# Patient Record
Sex: Male | Born: 1950
Health system: Southern US, Community
[De-identification: ages and names within clinical notes are randomized; demographics above are authoritative.]

## PROBLEM LIST (undated history)

## (undated) DIAGNOSIS — E785 Hyperlipidemia, unspecified: Secondary | ICD-10-CM

## (undated) DIAGNOSIS — I499 Cardiac arrhythmia, unspecified: Secondary | ICD-10-CM

## (undated) DIAGNOSIS — N529 Male erectile dysfunction, unspecified: Secondary | ICD-10-CM

## (undated) HISTORY — PX: COLONOSCOPY: SHX174

## (undated) HISTORY — DX: Male erectile dysfunction, unspecified: N52.9

## (undated) HISTORY — DX: Hyperlipidemia, unspecified: E78.5

## (undated) HISTORY — PX: EXCISION MASS ABDOMINAL: SHX6701

---

## 2000-04-10 LAB — HM MAMMOGRAPHY

## 2002-05-24 LAB — HM COLONOSCOPY

## 2006-06-12 ENCOUNTER — Ambulatory Visit: Payer: Self-pay | Admitting: Family Medicine

## 2006-07-18 ENCOUNTER — Ambulatory Visit: Payer: Self-pay | Admitting: Family Medicine

## 2006-12-09 ENCOUNTER — Ambulatory Visit: Payer: Self-pay | Admitting: Family Medicine

## 2007-12-07 ENCOUNTER — Ambulatory Visit: Payer: Self-pay | Admitting: Family Medicine

## 2008-07-11 ENCOUNTER — Ambulatory Visit: Payer: Self-pay | Admitting: Sports Medicine

## 2008-07-11 DIAGNOSIS — M79609 Pain in unspecified limb: Secondary | ICD-10-CM | POA: Insufficient documentation

## 2008-07-11 DIAGNOSIS — M25569 Pain in unspecified knee: Secondary | ICD-10-CM | POA: Insufficient documentation

## 2008-07-11 DIAGNOSIS — M2020 Hallux rigidus, unspecified foot: Secondary | ICD-10-CM | POA: Insufficient documentation

## 2008-08-12 ENCOUNTER — Ambulatory Visit: Payer: Self-pay | Admitting: Family Medicine

## 2008-09-25 ENCOUNTER — Emergency Department (HOSPITAL_COMMUNITY): Admission: EM | Admit: 2008-09-25 | Discharge: 2008-09-25 | Payer: Self-pay | Admitting: Emergency Medicine

## 2008-11-23 ENCOUNTER — Ambulatory Visit: Payer: Self-pay | Admitting: Family Medicine

## 2009-05-23 ENCOUNTER — Ambulatory Visit: Payer: Self-pay | Admitting: Family Medicine

## 2009-08-21 ENCOUNTER — Ambulatory Visit: Payer: Self-pay | Admitting: Sports Medicine

## 2009-08-21 DIAGNOSIS — S838X9A Sprain of other specified parts of unspecified knee, initial encounter: Secondary | ICD-10-CM | POA: Insufficient documentation

## 2009-08-21 DIAGNOSIS — S86819A Strain of other muscle(s) and tendon(s) at lower leg level, unspecified leg, initial encounter: Secondary | ICD-10-CM

## 2009-08-22 ENCOUNTER — Ambulatory Visit: Payer: Self-pay | Admitting: Family Medicine

## 2009-08-29 ENCOUNTER — Telehealth (INDEPENDENT_AMBULATORY_CARE_PROVIDER_SITE_OTHER): Payer: Self-pay | Admitting: *Deleted

## 2009-09-12 ENCOUNTER — Ambulatory Visit: Payer: Self-pay | Admitting: Family Medicine

## 2009-10-02 ENCOUNTER — Ambulatory Visit: Payer: Self-pay | Admitting: Family Medicine

## 2009-10-05 ENCOUNTER — Ambulatory Visit: Payer: Self-pay | Admitting: Family Medicine

## 2009-10-20 ENCOUNTER — Ambulatory Visit: Payer: Self-pay | Admitting: Family Medicine

## 2010-01-08 ENCOUNTER — Ambulatory Visit: Payer: Self-pay | Admitting: Family Medicine

## 2010-03-15 ENCOUNTER — Ambulatory Visit: Payer: Self-pay | Admitting: Family Medicine

## 2010-08-08 ENCOUNTER — Ambulatory Visit: Payer: Self-pay | Admitting: Family Medicine

## 2010-11-06 NOTE — Progress Notes (Signed)
Summary: QUESTIONS RE: INJURY  Phone Note Call from Patient   Summary of Call: Patient called and has a question regarding his injury.  He was seen here last week. Please call him @ [phone]. Initial call taken by: Shawna Orleans  Follow-up for Phone Call        dr fields spoke to pt via phone and gave him instructions Follow-up by: Lillia Pauls CMA,  August 29, 2009 11:56 AM

## 2010-11-06 NOTE — Assessment & Plan Note (Signed)
Summary: np/knee pain wp   Vital Signs:  Patient Profile:   60 Years Old Male Weight:      138.5 pounds Pulse rate:   51 / minute BP sitting:   128 / 76  Vitals Entered By: Lillia Pauls CMA (July 11, 2008 10:29 AM)                 Chief Complaint:  NP WITH ANTERIOR R KNEE PAIN X SEVERAL MONTHS; B CALF PAIN X 1 WK.  History of Present Illness: 60 yo WM new patient here for:  1.  anterior right knee pain x "months."  No swelling, clicking, popping, or feeling like it is going to give out.  Does not interfere with running.  Runs  ~ 60 miles/week for past 30 years.  No prior knee injury or trauma.  Taking diclofenac as needed, but unsure if it is helping with pain.  Pt thinks he had this pain 1 year ago, but it got better so he canceled his appointment with his doctor.  2. b/l calf pain- took cipro last week for sinusitis.  Last week while running notices cramping in b/l mid-calf region.  Worried about tendonitis, so stopped taking cipro and stopped running x 1 week.  Pain is almost gone now.      Current Allergies: ! CIPRO  Past Medical History:    HLD- on lipitor  Past Surgical History:    none   Social History:    runs 60 miles per week for past 30 years.  Does little other crosstraining.     Physical Exam  General:     NAD, pleasant, alert Msk:     Right Knee: Normal to inspection with no erythema or effusion or obvious bony abnormalities. Palpation normal with no warmth or joint line tenderness, patellar tenderness, or condyle tenderness. ROM normal in flexion and extension and lower leg rotation. Ligaments with solid consistent endpoints including ACL, PCL, LCL, MCL. Negative Mcmurray's. + tenderness with tilt of distal portion of patella.  Relative weakness of b/l VMO muscle group compared to lateral quad muscle groups.  Feet: Bilateral hallux rigiditis  Hips: B/L Hip abduction weakness  Gait:  barefoot running and walking gait  unremarkable    Impression & Recommendations:  Problem # 1:  KNEE PAIN, RIGHT (ICD-719.46) Assessment: New Likely due to relative weakness of VMO & gluteus medius muscle groups in long-distance runner with no other cross-training activity.  This is causing some instability of patella.   - cho pat fitted to right knee for patellar stabilization; pt to wear when running - pt can run as long as no swelling in knee and not limping - encouraged crosstraining on stationary bike to increasee strength of medial quad muscles - quad sets, 2 way straight leg raises, drop squats, bent leg lifts, and staning hip rotation - f/u in 6 weeks Orders: Knee Strap- [facility] (O2703)   Problem # 2:  CALF PAIN, BILATERAL (ICD-729.5) In relation to taking cipro for sinusitis.  Pt has self-discontinued Cipro and calf pain now better.  Complaints not c/w achillnes tendinopathy.  Advised pt to avoid cipro in future as he may be intolerant of it.  Also calf pain may have been due to viral infection causing sinusitis.    ]

## 2010-11-06 NOTE — Assessment & Plan Note (Signed)
Summary: HAMSTRING INJURY   Vital Signs:  Patient profile:   60 year old male Height:      68 inches Weight:      136 pounds BMI:     20.75 BP sitting:   127 / 80  Vitals Entered By: Lillia Pauls CMA (August 21, 2009 9:49 AM)  History of Present Illness: 1. L hamstring 3 weeks ago began having pain in lateral knee tendon. This progressed to the superior calf muscle and since migrated to the hamstring region. Has interfered with running and has been forcing him to stop runs short. Burgess Estelle made it 4 miles and noted "locking and cramping in the hamstring muscled" and forced to walk home. No pain at rest or with waling. Hasn't noted any swelling or mass in posterior thigh.   Has recently been doing 1/2 mile downhill repeats.   On lipitor for past 5 years.   Current Medications (verified): 1)  Lipitor 10 Mg Tabs (Atorvastatin Calcium) .... One By Mouth Daily For Cholesterol  Allergies (verified): 1)  ! Cipro  Physical Exam  General:  NAD, pleasant, alert Msk:  L leg + fullness and swelling in lateral portion of posterior knee joint + mild TTP over lateral hamstring tendon at insertion of fibular head -decreased L hamstring strength  Additional Exam:  Ultrasound:  no Baker's cyst + mild separation/fluid of hamstring tendon from fibular head One are of hypechoic changes in distal MM suggestive of fluid collection + mild separtion/fluid of gastroc tendon from fibular head increased vascularity of distal hamstring  images saved   Impression & Recommendations:  Problem # 1:  MUSCLE STRAIN, HAMSTRING MUSCLE (ICD-844.8)  initial exam concerning for baker's cyst but ultrasound has ruled this out.   recent 1/2 mile downhill work has likely caused overstriding creating tendonitis in both the hamstring and gastrocnemius tendons Will prescribe hamstring compression sleeve Will also provide heel lift to relieve pressure on hamstring.   need to check in 4 to 6 weeks if not  making good progress  Orders: US EXTREMITY NON-VASC REAL-TIME IMG (88416)  Complete Medication List: 1)  Lipitor 10 Mg Tabs (Atorvastatin calcium) .... One by mouth daily for cholesterol  Patient Instructions: 1)  3 sets of 15: hamstring curls, swings, and lunges with an ankle weight (both sides). 2)  Use the compression sleeve to help protect the hamstring. 3)  You can run as fast and as long as you keep from limping.  4)  Consider cross training on a bike. 5)  Do the heel raises on a step to work on your calf.

## 2010-12-20 ENCOUNTER — Encounter: Payer: Self-pay | Admitting: Sports Medicine

## 2010-12-20 ENCOUNTER — Ambulatory Visit (INDEPENDENT_AMBULATORY_CARE_PROVIDER_SITE_OTHER): Payer: BC Managed Care – PPO | Admitting: Sports Medicine

## 2010-12-20 DIAGNOSIS — M79609 Pain in unspecified limb: Secondary | ICD-10-CM

## 2010-12-25 NOTE — Assessment & Plan Note (Signed)
Summary: LEFT CALF INJURY DUE TO RUNNING,MC   Vital Signs:  Patient profile:   60 year old male Pulse rate:   64 / minute BP sitting:   114 / 76  (left arm)  Vitals Entered By: Jeannie Done CC: lt calf pain   CC:  lt calf pain.  History of Present Illness: Pt presents to clinic for eval of L calf pain that started approx 1 week ago during a run.   Pt runs 50 miles per week.  Pt has been traveling for work- was in [place] for 1 month, calf pain started about 5 days after he got home.    pain has been localized to the lateral aspect of the left calf. This has been severe enough to cause him to limp and he has been unable to run for the past few days.   Allergies: 1)  ! Cipro  Physical Exam  General:  Well-developed,well-nourished,in no acute distress; alert,appropriate and cooperative throughout examination Msk:  lt calf is 36.5 at 8 cm below tib tubercle rt calf is 35 cm at 8 cm below tib tubercle tender at lat head of gastroc, and proximal insertion of AT distal AT nontender localized swelling at gastroc heads lt calf leg lengths equal strength and muscle definition good   Additional Exam:  MSK [place] focal tear in lat gastroc this is assoc with neovessels edema of gastrioc on long and trans scan   Impression & Recommendations:  Problem # 1:  CALF PAIN, LEFT (ICD-729.5)  AT rehab program  compression sleeve  Trial of NTG  rescan in 4 weeks  Orders: Garment,belt,sleeve or other covering ,elastic or similar stretch (Z6109) Sports Insoles (L3510) [place] LIMITED (60454)  Complete Medication List: 1)  Lipitor 10 Mg Tabs (Atorvastatin calcium) .... One by mouth daily for cholesterol 2)  Nitroglycerin 0.2 Mg/hr Pt24 (Nitroglycerin) .... Apply 1/4 patch to affected area daily as directed.  change patch every 24 hours. Prescriptions: NITROGLYCERIN 0.2 MG/HR PT24 (NITROGLYCERIN) Apply 1/4 patch to affected area daily as directed.  Change patch every 24 hours.  #30 x  1   Entered and Authorized by:   Enid Baas MD   Signed by:   Enid Baas MD on 12/20/2010   Method used:   Electronically to        [facility] # [id number]* (retail)       [address]       [place], [place]  [address]       Ph: [phone]       Fax: [phone]   RxID:   [id number]    Orders Added: 1)  Garment,belt,sleeve or other covering ,elastic or similar stretch [A4466] 2)  Sports Insoles [L3510] 3)  [place] LIMITED [66440] 4)  Est. Patient Level III [34742]

## 2011-01-21 ENCOUNTER — Encounter: Payer: Self-pay | Admitting: Sports Medicine

## 2011-01-21 ENCOUNTER — Ambulatory Visit (INDEPENDENT_AMBULATORY_CARE_PROVIDER_SITE_OTHER): Payer: BC Managed Care – PPO | Admitting: Sports Medicine

## 2011-01-21 VITALS — BP 108/76

## 2011-01-21 DIAGNOSIS — M79609 Pain in unspecified limb: Secondary | ICD-10-CM

## 2011-01-21 NOTE — Progress Notes (Signed)
  Subjective:    Patient ID: Edwin Jimenez, male    DOB: [birth year], 60 y.o.   MRN: [id number]  HPI Patient returns in followup of left calf injury. He has been using nitroglycerin patches. He has been doing the exercises on a daily basis. He is using sports insoles with a heel lift and these also help with his running. He has steadily increased his running to her last week he did 40 miles. He states that he does not have any significant pain with running and only some aching afterwards. He states he is 90% better. At first he was using the compression sleeve all the time and now is using it just runs.   Review of Systems No side effects with the nitroglycerin.    Objective:   Physical Exam    patient is in no acute distress. Calf is nontender and appears to be the same diameter as the right calf. Palpation reveals one area along the lateral gastroc that is still slightly sensitive to deep pressure.  Musculoskeletal ultrasound The area prior to hearing has about 90% resolved. The area of neovessels is now absent and there is some increased flow along the muscle belly but this looks to be a normal pattern. There is only a small hypoechoic area remaining.   Assessment & Plan:

## 2011-01-21 NOTE — Assessment & Plan Note (Signed)
This appears to be 90% healed. However with the tendency of the calf tears to recur I would like him to keep using compression and heel lifts. He should continue his exercises a minimum of 3 times per week.  Keep his running knowledge and no more than 40 for the next 2 weeks and then at 5 miles per week. Continue his nitroglycerin for another 6 weeks. If he feels that he is completely healed at that time he can resume his normal activities and wean off the nitroglycerin. If not he should return and we will repeat the scan.

## 2011-04-19 ENCOUNTER — Telehealth: Payer: Self-pay | Admitting: Family Medicine

## 2011-04-19 NOTE — Telephone Encounter (Signed)
FAXED RX TODAY

## 2011-04-19 NOTE — Telephone Encounter (Signed)
He can have the Ambien

## 2011-04-19 NOTE — Telephone Encounter (Signed)
IS THIS OK 

## 2011-08-05 ENCOUNTER — Other Ambulatory Visit: Payer: Self-pay | Admitting: Family Medicine

## 2011-08-06 NOTE — Telephone Encounter (Signed)
Needs an OV to get anymore refills

## 2011-09-03 ENCOUNTER — Encounter: Payer: Self-pay | Admitting: Family Medicine

## 2011-09-04 ENCOUNTER — Ambulatory Visit (INDEPENDENT_AMBULATORY_CARE_PROVIDER_SITE_OTHER): Payer: BC Managed Care – PPO | Admitting: Family Medicine

## 2011-09-04 ENCOUNTER — Encounter: Payer: Self-pay | Admitting: Family Medicine

## 2011-09-04 DIAGNOSIS — E785 Hyperlipidemia, unspecified: Secondary | ICD-10-CM | POA: Insufficient documentation

## 2011-09-04 DIAGNOSIS — Z8249 Family history of ischemic heart disease and other diseases of the circulatory system: Secondary | ICD-10-CM | POA: Insufficient documentation

## 2011-09-04 DIAGNOSIS — Z0000 Encounter for general adult medical examination without abnormal findings: Secondary | ICD-10-CM

## 2011-09-04 DIAGNOSIS — N529 Male erectile dysfunction, unspecified: Secondary | ICD-10-CM | POA: Insufficient documentation

## 2011-09-04 LAB — CBC WITH DIFFERENTIAL/PLATELET
Basophils Absolute: 0 10*3/uL (ref 0.0–0.1)
Basophils Relative: 1 % (ref 0–1)
Eosinophils Absolute: 0.1 10*3/uL (ref 0.0–0.7)
Eosinophils Relative: 2 % (ref 0–5)
HCT: 48.2 % (ref 39.0–52.0)
Hemoglobin: 16.2 g/dL (ref 13.0–17.0)
Lymphocytes Relative: 38 % (ref 12–46)
Lymphs Abs: 1.8 10*3/uL (ref 0.7–4.0)
MCH: 32.5 pg (ref 26.0–34.0)
MCHC: 33.6 g/dL (ref 30.0–36.0)
MCV: 96.8 fL (ref 78.0–100.0)
Monocytes Absolute: 0.5 10*3/uL (ref 0.1–1.0)
Monocytes Relative: 10 % (ref 3–12)
Neutro Abs: 2.3 10*3/uL (ref 1.7–7.7)
Neutrophils Relative %: 50 % (ref 43–77)
Platelets: 206 10*3/uL (ref 150–400)
RBC: 4.98 MIL/uL (ref 4.22–5.81)
RDW: 13.3 % (ref 11.5–15.5)
WBC: 4.7 10*3/uL (ref 4.0–10.5)

## 2011-09-04 LAB — COMPREHENSIVE METABOLIC PANEL
ALT: 44 U/L (ref 0–53)
AST: 37 U/L (ref 0–37)
Albumin: 4.4 g/dL (ref 3.5–5.2)
Alkaline Phosphatase: 60 U/L (ref 39–117)
BUN: 21 mg/dL (ref 6–23)
CO2: 29 mEq/L (ref 19–32)
Calcium: 9.6 mg/dL (ref 8.4–10.5)
Chloride: 104 mEq/L (ref 96–112)
Creat: 0.87 mg/dL (ref 0.50–1.35)
Glucose, Bld: 84 mg/dL (ref 70–99)
Potassium: 4.3 mEq/L (ref 3.5–5.3)
Sodium: 142 mEq/L (ref 135–145)
Total Bilirubin: 0.7 mg/dL (ref 0.3–1.2)
Total Protein: 6.9 g/dL (ref 6.0–8.3)

## 2011-09-04 LAB — LIPID PANEL
Cholesterol: 158 mg/dL (ref 0–200)
HDL: 56 mg/dL (ref >39)
LDL Cholesterol: 91 mg/dL (ref 0–99)
Total CHOL/HDL Ratio: 2.8 Ratio
Triglycerides: 55 mg/dL (ref <150)
VLDL: 11 mg/dL (ref 0–40)

## 2011-09-04 LAB — PSA: PSA: 0.37 ng/mL (ref <=4.00)

## 2011-09-04 MED ORDER — ATORVASTATIN CALCIUM 20 MG PO TABS: 20.0000 mg | ORAL_TABLET | Freq: Every day | ORAL | Status: DC

## 2011-09-04 MED ORDER — TADALAFIL 10 MG PO TABS: 5.0000 mg | ORAL_TABLET | ORAL | Status: DC | PRN

## 2011-09-04 NOTE — Patient Instructions (Signed)
Continue to take excellent care of yourself 

## 2011-09-04 NOTE — Progress Notes (Signed)
  Subjective:    Patient ID: Edwin Jimenez, male    DOB: 1951-02-07, 60 y.o.   MRN: [id number]  HPI He is here for a complete examination. He continues on his Lipitor. He also does have intermittent difficulty with erectile dysfunction. He did find that Cialis didn't work the best. He continues on aspirin and fish oil. His family and social history is unchanged. Work is going well. He does run regularly.   Review of Systems  Constitutional: Negative.   HENT: Negative.   Eyes: Negative.   Respiratory: Negative.   Cardiovascular: Negative.   Gastrointestinal: Negative.   Genitourinary: Negative.   Musculoskeletal: Negative.   Skin: Negative.   Neurological: Negative.   Hematological: Negative.   Psychiatric/Behavioral: Negative.        Objective:   Physical Exam BP 110/70  Pulse 54  Ht 5\' 8"  (1.727 m)  Wt 135 lb (61.236 kg)  BMI 20.53 kg/m2  General Appearance:    Alert, cooperative, no distress, appears stated age  Head:    Normocephalic, without obvious abnormality, atraumatic  Eyes:    PERRL, conjunctiva/corneas clear, EOM's intact, fundi    benign  Ears:    Normal TM's and external ear canals  after cerumen was removed   Nose:   Nares normal, mucosa normal, no drainage or sinus   tenderness  Throat:   Lips, mucosa, and tongue normal; teeth and gums normal  Neck:   Supple, no lymphadenopathy;  thyroid:  no   enlargement/tenderness/nodules; no carotid   bruit or JVD  Back:    Spine nontender, no curvature, ROM normal, no CVA     tenderness  Lungs:     Clear to auscultation bilaterally without wheezes, rales or     ronchi; respirations unlabored  Chest Wall:    No tenderness or deformity   Heart:    Regular rate and rhythm, S1 and S2 normal, no murmur, rub   or gallop  Breast Exam:    No chest wall tenderness, masses or gynecomastia  Abdomen:     Soft, non-tender, nondistended, normoactive bowel sounds,    no masses, no hepatosplenomegaly  Genitalia:   deferred     Rectal:   deferred   Extremities:   No clubbing, cyanosis or edema  Pulses:   2+ and symmetric all extremities  Skin:   Skin color, texture, turgor normal, no rashes or lesions  Lymph nodes:   Cervical, supraclavicular, and axillary nodes normal  Neurologic:   CNII-XII intact, normal strength, sensation and gait; reflexes 2+ and symmetric throughout          Psych:   Normal mood, affect, hygiene and grooming.           Assessment & Plan:   1. Routine general medical examination at a health care facility  CBC with Differential, Comprehensive metabolic panel, Lipid panel, PSA, HM COLONOSCOPY  2. Family history of ischemic heart disease (IHD)  CBC with Differential, Comprehensive metabolic panel, Lipid panel  3. Hyperlipidemia LDL goal < 70  Lipid panel  4. ED (erectile dysfunction)     5 cerumen impaction  prescription for Cialis was written. Encouraged him to continue with his active lifestyle. Lipitor was renewed. Recommend he return here when he turns 64 a Zostavax.

## 2011-09-20 ENCOUNTER — Ambulatory Visit: Payer: BC Managed Care – PPO | Admitting: Family Medicine

## 2011-11-05 ENCOUNTER — Encounter: Payer: Self-pay | Admitting: Internal Medicine

## 2011-11-08 ENCOUNTER — Other Ambulatory Visit: Payer: BC Managed Care – PPO

## 2011-11-08 DIAGNOSIS — E785 Hyperlipidemia, unspecified: Secondary | ICD-10-CM

## 2011-11-09 LAB — LIPID PANEL
Cholesterol: 111 mg/dL (ref 0–200)
HDL: 46 mg/dL (ref >39)
LDL Cholesterol: 57 mg/dL (ref 0–99)
Total CHOL/HDL Ratio: 2.4 Ratio
Triglycerides: 38 mg/dL (ref <150)
VLDL: 8 mg/dL (ref 0–40)

## 2011-11-11 NOTE — Progress Notes (Signed)
Quick Note:  The blood work is normal ______ 

## 2011-11-28 LAB — HM COLONOSCOPY

## 2012-01-16 ENCOUNTER — Encounter: Payer: Self-pay | Admitting: Gastroenterology

## 2012-03-19 ENCOUNTER — Ambulatory Visit (INDEPENDENT_AMBULATORY_CARE_PROVIDER_SITE_OTHER): Payer: BC Managed Care – PPO | Admitting: Sports Medicine

## 2012-03-19 VITALS — BP 110/70 | Ht 68.0 in | Wt 138.0 lb

## 2012-03-19 DIAGNOSIS — S86819A Strain of other muscle(s) and tendon(s) at lower leg level, unspecified leg, initial encounter: Secondary | ICD-10-CM

## 2012-03-19 DIAGNOSIS — S838X9A Sprain of other specified parts of unspecified knee, initial encounter: Secondary | ICD-10-CM

## 2012-03-19 NOTE — Progress Notes (Signed)
Subjective:    Patient ID: Edwin Jimenez is a 61 y.o. male. DOB [birth year]  MRN [id number]   Chief Complaint: Hamstring pain L>R HPI Edwin Jimenez is a 61 yo man who presents for evaluation of hamstring pain and tightness.  He reports that problem started while running the [employer].  Pt reports that at mile 17-18 he began to feel "twinges" in bilateral hamstrings L>R. At mile 19-20 he had to stop running because of severe cramping.  Walked until cramping abated enough to allow him to finish the race running.  He reports that since this marathon, his hamstrings have not been the same.  He had to take 2 weeks off completely from all training and now even a 5-6 mild run causes him to have cramping and tightness afterwards that causes him to limp. Pain to day is predominantly in the proximal medial thigh and posterior upper leg.  He denies any weakness, numbness, or tingling.    Aside from this pain, patient also reports that he is a profuse sweater and is taking a statin medication.  He is concerned that these two things may be influencing the pain.  On statin medication since 2006. Dose last increased in 2007.  No previous muscle cramps attributed to this medication.   Current Outpatient Prescriptions on File Prior to Visit  Medication Sig Dispense Refill  . aspirin 81 MG tablet Take 81 mg by mouth daily.        Marland Kitchen atorvastatin (LIPITOR) 20 MG tablet Take 1 tablet (20 mg total) by mouth daily.  90 tablet  3  . calcium carbonate (OS-CAL) 1250 MG chewable tablet Chew 1 tablet by mouth daily.        . fish oil-omega-3 fatty acids 1000 MG capsule Take 2 g by mouth daily.        . tadalafil (CIALIS) 10 MG tablet Take 0.5 tablets (5 mg total) by mouth as needed for erectile dysfunction.  30 tablet  0   No Known Allergies  Social History   Occupational History  . Not on file.   Social History Main Topics  . Smoking status: Never Smoker   . Smokeless tobacco: Not on file  . Alcohol Use: Not on file  .  Drug Use: Not on file  . Sexually Active: Not on file    ROS  12 system review of systems is negative except per HPI     Objective:   Ortho Exam  GEN: Alert, cooperative male in no apparent distress RESP: Normal work of breathing MSK: Significant weakness of bilateral hip flexors, abductors, and adductors.  Normal strength in bilateral quadriceps and hamstring. No point tenderness to palpation. Full ROM of hip, knee, and ankle without pain. NEURO: No deficits in sensation, strength defecits as above  Normal running gait     Assessment:     Bilateral hip abductor, adductor, and flexor weakness       Plan:     Dietary supplements:  1. Recommend Coenzyme Q10 supplement daily for prevention of cramping 2/2 statin medication 2. Take 2-3 tablespoons of dill pickle juice after a run to replace acetic acid 3. Drink at least 16 oz gatorade daily, especially during summer months to avoid dehydration and replace electrolytes 4. Recommend high protein foods such as tuna or consider supplementing with creatine powder 5 grams daily  Exercises - do each exercise on both legs: 1. Side-lying leg lifts (inside and outside) 3 sets of 15 2. Hip flexion (knee lifts) 3  sets of 15 3. Lateral step ups (from side) 3 sets of 15 4. Forward step ups 3 sets of 15 5. Cross over step ups 3 sets of 15  Avoid dehydration.  Weigh yourself daily to monitor for unintentional weight loss and monitor urine color.  If urine is darker than lemonade, increase fluid intake.   Limit training until symptoms and strength improves.  If not improved in 1 month, return to clinic.

## 2012-03-19 NOTE — Patient Instructions (Addendum)
Dietary supplements:  1. Recommend Coenzyme Q10 supplement daily 2. Take 2-3 tablespoons of dill pickle juice after a run to replace acetic acid 3. Drink at least 16 oz gatorade daily, especially during summer months 4. Recommend high protein foods such as tuna or consider supplementing with creatine powder 5 grams daily  Exercises - do each exercise on both legs: 1. Side-lying leg lifts (inside and outside) 3 sets of 15 2. Hip flexion (knee lifts) 3 sets of 15 3. Lateral step ups (from side) 3 sets of 15 4. Forward step ups 3 sets of 15 5. Cross over step ups 3 sets of 15  Avoid dehydration.  Weigh yourself daily to monitor for unintentional weight loss and monitor urine color.  If urine is darker than lemonade, increase fluid intake.   Limit training until symptoms and strength improves.  If not improved in 1 month, return to clinic.

## 2012-03-19 NOTE — Assessment & Plan Note (Signed)
Please see the assessment and plan as this patient has hamstring pain but actually notices weakness are in the of the muscle groups around the leg forcing him to overuse his hamstrings  We will proceed with nutritional changes supplements and exercises as described

## 2012-04-07 ENCOUNTER — Ambulatory Visit (INDEPENDENT_AMBULATORY_CARE_PROVIDER_SITE_OTHER): Payer: BC Managed Care – PPO | Admitting: Sports Medicine

## 2012-04-07 VITALS — BP 123/75

## 2012-04-07 DIAGNOSIS — S86819A Strain of other muscle(s) and tendon(s) at lower leg level, unspecified leg, initial encounter: Secondary | ICD-10-CM

## 2012-04-07 DIAGNOSIS — S838X9A Sprain of other specified parts of unspecified knee, initial encounter: Secondary | ICD-10-CM

## 2012-04-07 DIAGNOSIS — IMO0002 Reserved for concepts with insufficient information to code with codable children: Secondary | ICD-10-CM

## 2012-04-07 DIAGNOSIS — S76019A Strain of muscle, fascia and tendon of unspecified hip, initial encounter: Secondary | ICD-10-CM | POA: Insufficient documentation

## 2012-04-07 NOTE — Assessment & Plan Note (Addendum)
Suspect this is secondary to weak abductors and adductors.  Advised to continue HEP.   Cont to use thigh compression sleeves for running and keep mileage at level that does not increase delayed pain

## 2012-04-07 NOTE — Assessment & Plan Note (Addendum)
Secondary to muscle weakness.  Advised to continue HEP, add weight with step-up exercise, and not to run distances that cause severe pain.   Also has week abduction  With weak core he needs to improve strength before he can up his mileage much  Of note, I think he may be taking additional time as Annia Belt seemed to have caused a lot of MM damage  Reck 6 wks

## 2012-04-07 NOTE — Patient Instructions (Signed)
Please continue your hip strengthening exercises.  For the step- up exercise, you can start adding weight with a barbell or two hand weights. For your running, do not push yourself to the point of severe pain.  Please follow up with [place] in 4-6 weeks.

## 2012-04-07 NOTE — Progress Notes (Signed)
  Subjective:    Patient ID: Edwin Jimenez, male    DOB: 1951/06/10, 61 y.o.   MRN: [id number]  HPI  Edwin Jimenez returns for his left hamstring pain.  He says he has been doing the home exercise program and taking the supplements suggested by Dr. Darrick Penna without much improvement.  He says the pain has moved some- he still has pain in his left hamstring but now has some significant pain in his left groin as well.    On Saturday he ran about 11 miles, and says he "paid for it" on Sunday.  He ran 6 miles this morning and feels OK.  He is still running about 5 days a week.    Review of Systems     Objective:   Physical Exam BP 123/75 General appearance: alert, cooperative and no distress Hip: ROM HipRotational ROM is norm bilat   Flexion: 120 Deg, Extension: 100 Deg, Abduction: 45 Deg, Adduction: 45 Deg Strength IR: 5/5, ER: 5/5, Flexion: 5/5, Extension: 5/5, Abduction: 4/5, Adduction: 4/5 Pelvic alignment unremarkable to inspection and palpation. Standing hip rotation and gait without trendelenburg / unsteadiness. Greater trochanter without tenderness to palpation.       Assessment & Plan:

## 2012-04-30 ENCOUNTER — Ambulatory Visit
Admission: RE | Admit: 2012-04-30 | Discharge: 2012-04-30 | Disposition: A | Discharge status: Home / Self Care | Payer: BC Managed Care – PPO | Source: Ambulatory Visit | Attending: Sports Medicine | Admitting: Sports Medicine

## 2012-04-30 ENCOUNTER — Ambulatory Visit (INDEPENDENT_AMBULATORY_CARE_PROVIDER_SITE_OTHER): Payer: BC Managed Care – PPO | Admitting: Sports Medicine

## 2012-04-30 ENCOUNTER — Encounter: Payer: Self-pay | Admitting: Sports Medicine

## 2012-04-30 VITALS — BP 112/74

## 2012-04-30 DIAGNOSIS — S86819A Strain of other muscle(s) and tendon(s) at lower leg level, unspecified leg, initial encounter: Secondary | ICD-10-CM

## 2012-04-30 DIAGNOSIS — S76019A Strain of muscle, fascia and tendon of unspecified hip, initial encounter: Secondary | ICD-10-CM

## 2012-04-30 DIAGNOSIS — R1030 Lower abdominal pain, unspecified: Secondary | ICD-10-CM

## 2012-04-30 DIAGNOSIS — S838X9A Sprain of other specified parts of unspecified knee, initial encounter: Secondary | ICD-10-CM

## 2012-04-30 DIAGNOSIS — M25559 Pain in unspecified hip: Secondary | ICD-10-CM

## 2012-04-30 DIAGNOSIS — R109 Unspecified abdominal pain: Secondary | ICD-10-CM

## 2012-04-30 DIAGNOSIS — IMO0002 Reserved for concepts with insufficient information to code with codable children: Secondary | ICD-10-CM

## 2012-04-30 NOTE — Progress Notes (Signed)
  Subjective:    Patient ID: Edwin Jimenez, male    DOB: 10-Dec-1950, 61 y.o.   MRN: [id number]  HPI  Patient returns with groin pain The started with severe hamstring pain back in the [facility] in April He continued running and gradually he has had less hamstring pain although it persists He has had an increase in adductor pain He has continued to try to run but now shortening his mouth because he has too much pain Compression sleeve feels somewhat better  He has been pretty consistent with his exercise  Review of Systems     Objective:   Physical Exam   No acute distress  Tenderness at the insertion of the abductor tendon to the pubis Tenderness at the insertion of the left medial hamstring He still has some weakness as noted before but it is unclear if much of this is related to the pain  Musculoskeletal ultrasound There is a small avulsion fracture of the common abductor tendon with hypoechoic change surrounding There is an increase in Doppler activity At the hamstring insertion to the it she'll tuberosity there is also hypoechoic change suggestive of a pseudo-bursa as well as a small traction spur Doppler activity is slightly increased     Assessment & Plan:

## 2012-04-30 NOTE — Assessment & Plan Note (Signed)
Documented today by ultrasound  Keep up exercises  I think he needs to take a four-week break from running and we will recheck after that He may cross train on the bike

## 2012-04-30 NOTE — Assessment & Plan Note (Signed)
With the evulsion fracture noted we will get an AP x-ray of the pelvis  I want to be sure there are no other bony changes seen  He will need to be very gentle in the adductor and abductor exercises so as not to worsen the avulsion  Recheck in 4 weeks  Okay to use a trial of nitroglycerin

## 2012-05-12 ENCOUNTER — Other Ambulatory Visit: Payer: Self-pay | Admitting: *Deleted

## 2012-05-12 DIAGNOSIS — S76019A Strain of muscle, fascia and tendon of unspecified hip, initial encounter: Secondary | ICD-10-CM

## 2012-05-12 DIAGNOSIS — S838X9A Sprain of other specified parts of unspecified knee, initial encounter: Secondary | ICD-10-CM

## 2012-05-13 ENCOUNTER — Ambulatory Visit: Payer: BC Managed Care – PPO | Attending: Sports Medicine

## 2012-05-13 DIAGNOSIS — R269 Unspecified abnormalities of gait and mobility: Secondary | ICD-10-CM | POA: Insufficient documentation

## 2012-05-13 DIAGNOSIS — IMO0001 Reserved for inherently not codable concepts without codable children: Secondary | ICD-10-CM | POA: Insufficient documentation

## 2012-05-13 DIAGNOSIS — M25559 Pain in unspecified hip: Secondary | ICD-10-CM | POA: Insufficient documentation

## 2012-05-13 DIAGNOSIS — R5381 Other malaise: Secondary | ICD-10-CM | POA: Insufficient documentation

## 2012-05-15 ENCOUNTER — Ambulatory Visit: Payer: BC Managed Care – PPO | Admitting: Physical Therapy

## 2012-05-18 ENCOUNTER — Ambulatory Visit: Payer: BC Managed Care – PPO | Admitting: Physical Therapy

## 2012-05-20 ENCOUNTER — Ambulatory Visit: Payer: BC Managed Care – PPO | Admitting: Physical Therapy

## 2012-05-21 ENCOUNTER — Encounter: Payer: Self-pay | Admitting: Family Medicine

## 2012-05-21 ENCOUNTER — Ambulatory Visit (INDEPENDENT_AMBULATORY_CARE_PROVIDER_SITE_OTHER): Payer: BC Managed Care – PPO | Admitting: Family Medicine

## 2012-05-21 VITALS — BP 112/70 | HR 66 | Wt 139.0 lb

## 2012-05-21 DIAGNOSIS — K4090 Unilateral inguinal hernia, without obstruction or gangrene, not specified as recurrent: Secondary | ICD-10-CM

## 2012-05-21 NOTE — Progress Notes (Signed)
PT HAS APPT SEPT 5 AT 9:30 AM AT [facility]

## 2012-05-21 NOTE — Progress Notes (Signed)
  Subjective:    Patient ID: Edwin Jimenez, male    DOB: December 29, 1950, 62 y.o.   MRN: [id number]  HPI He is here for evaluation of swelling in the left inguinal area. He noted this within the last several days. He has been having difficulty with musculoskeletal issues and has seen Dr. Darrick Penna concerning this. Those records were reviewed.   Review of Systems     Objective:   Physical Exam Alert and in no distress. Exam of the inguinal area does show a 3 cm palpable lesion that is enlarged with coughing.       Assessment & Plan:   1. Left inguinal hernia  Ambulatory referral to General Surgery   since he is being held back from running due to the injuries, this would be a good time to get the hernia taking care of.

## 2012-05-29 ENCOUNTER — Encounter: Payer: BC Managed Care – PPO | Admitting: Physical Therapy

## 2012-06-01 ENCOUNTER — Ambulatory Visit (INDEPENDENT_AMBULATORY_CARE_PROVIDER_SITE_OTHER): Payer: BC Managed Care – PPO | Admitting: Sports Medicine

## 2012-06-01 VITALS — BP 102/60 | Ht 68.0 in | Wt 138.0 lb

## 2012-06-01 DIAGNOSIS — S838X9A Sprain of other specified parts of unspecified knee, initial encounter: Secondary | ICD-10-CM

## 2012-06-01 DIAGNOSIS — IMO0002 Reserved for concepts with insufficient information to code with codable children: Secondary | ICD-10-CM

## 2012-06-01 DIAGNOSIS — S76019A Strain of muscle, fascia and tendon of unspecified hip, initial encounter: Secondary | ICD-10-CM

## 2012-06-01 DIAGNOSIS — S86819A Strain of other muscle(s) and tendon(s) at lower leg level, unspecified leg, initial encounter: Secondary | ICD-10-CM

## 2012-06-01 MED ORDER — CLINDAMYCIN PHOSPHATE 1 % EX LOTN: TOPICAL_LOTION | Freq: Two times a day (BID) | CUTANEOUS | Status: AC

## 2012-06-01 NOTE — Assessment & Plan Note (Signed)
On H Test he lags 10 deg on full HS extension on left  This is about 90% resolved  Cont on PT

## 2012-06-01 NOTE — Assessment & Plan Note (Addendum)
Continue with PT  This has not healed yet so would cont to XTrain until able to run without much pain  After 6 more week of PT I will repeat scan of adductor  He will see Dr Daphine Deutscher to see if possible hernia as that could be contributing to his pain in left groin as well

## 2012-06-01 NOTE — Progress Notes (Signed)
  Subjective:    Patient ID: Edwin Jimenez, male    DOB: [birth year], 61 y.o.   MRN: [id number]  HPI  Since last visit Dr Susann Givens noted probable left inguinal hernia and suggested repair  PT at [facility] - some benefit for pain/ tried Amado Coe who is adding more taping and other modalities and this helps even more Adductor exercise still creates some pain initially HS no pain on daily basis but still TTP on exam  Uses compression sleeve for exercise  Able to bike or elliptical Not able to run much as adductor pain recurs  Last scan showed small avulsion of adductor tendon Swelling around bursal area of HS insertion HS now non painful  Review of Systems     Objective:   Physical Exam  NAD  Fullness in left inguinal area with some bulging/ no descent into scrotium  Adductor tendon still tender on left Weakness 2/2 pain on adductor testing  HS testing normal today  Running gait still with some antlagic limp      Assessment & Plan:

## 2012-06-04 ENCOUNTER — Encounter: Payer: BC Managed Care – PPO | Admitting: Physical Therapy

## 2012-06-11 ENCOUNTER — Encounter (INDEPENDENT_AMBULATORY_CARE_PROVIDER_SITE_OTHER): Payer: Self-pay | Admitting: Surgery

## 2012-06-11 ENCOUNTER — Ambulatory Visit (INDEPENDENT_AMBULATORY_CARE_PROVIDER_SITE_OTHER): Payer: BC Managed Care – PPO | Admitting: Surgery

## 2012-06-11 VITALS — BP 110/68 | HR 60 | Temp 97.4°F | Resp 14 | Ht 68.0 in | Wt 139.0 lb

## 2012-06-11 DIAGNOSIS — K4090 Unilateral inguinal hernia, without obstruction or gangrene, not specified as recurrent: Secondary | ICD-10-CM | POA: Insufficient documentation

## 2012-06-11 NOTE — Progress Notes (Signed)
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  He has a left inguinal hernia that he is aware of.  We discussed various approaches to management including doing nothing, repair now while he is recovering from a hamstring injury, or weight and repair it later. The important part of his history is he travels from [place] and often visits countries to may not be optimal for emergency surgery for an incarcerated hernia. His wife also had an incarcerated umbilical hernia couple years ago.  We discussed open and laparoscopic repair and my recommendation was for an open repair on the left side with mesh. The he is in a discuss timing with office no fluid [place] for left inguinal hernia repair.  Past Medical History  Diagnosis Date  . Dyslipidemia   . ED (erectile dysfunction)     No past surgical history on file.  Current Outpatient Prescriptions  Medication Sig Dispense Refill  . aspirin 81 MG tablet Take 81 mg by mouth daily.        Marland Kitchen atorvastatin (LIPITOR) 20 MG tablet Take 1 tablet (20 mg total) by mouth daily.  90 tablet  3  . clindamycin (CLEOCIN T) 1 % lotion Apply topically 2 (two) times daily.  60 mL  1  . fish oil-omega-3 fatty acids 1000 MG capsule Take 2 g by mouth daily.        . tadalafil (CIALIS) 10 MG tablet Take 0.5 tablets (5 mg total) by mouth as needed for erectile dysfunction.  30 tablet  0   Review of patient's allergies indicates no known allergies. Family History  Problem Relation Age of Onset  . Heart disease Father    Social History:   reports that he has never smoked. He does not have any smokeless tobacco history on file. He reports that he drinks alcohol. He reports that he does not use illicit drugs.   REVIEW OF SYSTEMS - PERTINENT POSITIVES ONLY: noncontributory  Physical Exam:   Blood pressure 110/68, pulse  60, temperature 97.4 F (36.3 C), temperature source Temporal, resp. rate 14, height 5\' 8"  (1.727 m), weight 139 lb (63.05 kg). Body mass index is 21.13 kg/(m^2).  Gen:  WDWN WM NAD  Neurological: Alert and oriented to person, place, and time. Motor and sensory function is grossly intact  Head: Normocephalic and atraumatic.  Eyes: Conjunctivae are normal. Pupils are equal, round, and reactive to light. No scleral icterus.  Neck: Normal range of motion. Neck supple. No tracheal deviation or thyromegaly present.  Cardiovascular:  SR without murmurs or gallops.  No carotid bruits Respiratory: Effort normal.  No respiratory distress. No chest wall tenderness. Breath sounds normal.  No wheezes, rales or rhonchi.  Abdomen:  nontender GU:  Lipoma on the right, left inguinal hernia that is reducible Musculoskeletal: Normal range of motion. Extremities are nontender. No cyanosis, edema or clubbing noted Lymphadenopathy: No cervical, preauricular, postauricular or axillary adenopathy is present Skin: Skin is warm and dry. No rash noted. No diaphoresis. No erythema. No pallor.  Numerous lipomata Pscyh: Normal mood and affect. Behavior is normal. Judgment and thought content normal.   LABORATORY RESULTS: No results found for this or any previous visit (from the past 48 hour(s)).  RADIOLOGY RESULTS: No results found.  Problem List: Patient Active Problem List  Diagnosis  . KNEE PAIN, RIGHT  . CALF PAIN, BILATERAL  .  HALLUX RIGIDUS  . MUSCLE STRAIN, HAMSTRING MUSCLE  . Family history of ischemic heart disease (IHD)  . Hyperlipidemia LDL goal < 70  . ED (erectile dysfunction)  . Strain of hip adductor muscle  . Inguinal hernia, left    Assessment & Plan: Left inguinal hernia in a runner currently recovering from left hamstring injury.   Open LIH repair    Matt B. Daphine Deutscher, MD, [facility] [phone] beeper [phone]  06/11/2012 10:52 AM

## 2012-06-11 NOTE — Patient Instructions (Signed)
Inguinal Hernia, Adult  Muscles help keep everything in the body in its proper place. But if a weak spot in the muscles develops, something can poke through. That is called a hernia. When this happens in the lower part of the belly (abdomen), it is called an inguinal hernia. (It takes its name from a part of the body in this region called the inguinal canal.) A weak spot in the wall of muscles lets some fat or part of the small intestine bulge through. An inguinal hernia can develop at any age. Men get them more often than women.  CAUSES   In adults, an inguinal hernia develops over time.  · It can be triggered by:  · Suddenly straining the muscles of the lower abdomen.  · Lifting heavy objects.  · Straining to have a bowel movement. Difficult bowel movements (constipation) can lead to this.  · Constant coughing. This may be caused by smoking or lung disease.  · Being overweight.  · Being pregnant.  · Working at a job that requires long periods of standing or heavy lifting.  · Having had an inguinal hernia before.  One type can be an emergency situation. It is called a strangulated inguinal hernia. It develops if part of the small intestine slips through the weak spot and cannot get back into the abdomen. The blood supply can be cut off. If that happens, part of the intestine may die. This situation requires emergency surgery.  SYMPTOMS   Often, a small inguinal hernia has no symptoms. It is found when a healthcare provider does a physical exam. Larger hernias usually have symptoms.   · In adults, symptoms may include:  · A lump in the groin. This is easier to see when the person is standing. It might disappear when lying down.  · In men, a lump in the scrotum.  · Pain or burning in the groin. This occurs especially when lifting, straining or coughing.  · A dull ache or feeling of pressure in the groin.  · Signs of a strangulated hernia can include:  · A bulge in the groin that becomes very painful and tender to the  touch.  · A bulge that turns red or purple.  · Fever, nausea and vomiting.  · Inability to have a bowel movement or to pass gas.  DIAGNOSIS   To decide if you have an inguinal hernia, a healthcare provider will probably do a physical examination.  · This will include asking questions about any symptoms you have noticed.  · The healthcare provider might feel the groin area and ask you to cough. If an inguinal hernia is felt, the healthcare provider may try to slide it back into the abdomen.  · Usually no other tests are needed.  TREATMENT   Treatments can vary. The size of the hernia makes a difference. Options include:  · Watchful waiting. This is often suggested if the hernia is small and you have had no symptoms.  · No medical procedure will be done unless symptoms develop.  · You will need to watch closely for symptoms. If any occur, contact your healthcare provider right away.  · Surgery. This is used if the hernia is larger or you have symptoms.  · Open surgery. This is usually an outpatient procedure (you will not stay overnight in a hospital). An cut (incision) is made through the skin in the groin. The hernia is put back inside the abdomen. The weak area in the muscles is   then repaired by herniorrhaphy or hernioplasty. Herniorrhaphy: in this type of surgery, the weak muscles are sewn back together. Hernioplasty: a patch or mesh is used to close the weak area in the abdominal wall.  · Laparoscopy. In this procedure, a surgeon makes small incisions. A thin tube with a tiny video camera (called a laparoscope) is put into the abdomen. The surgeon repairs the hernia with mesh by looking with the video camera and using two long instruments.  HOME CARE INSTRUCTIONS   · After surgery to repair an inguinal hernia:  · You will need to take pain medicine prescribed by your healthcare provider. Follow all directions carefully.  · You will need to take care of the wound from the incision.  · Your activity will be  restricted for awhile. This will probably include no heavy lifting for several weeks. You also should not do anything too active for a few weeks. When you can return to work will depend on the type of job that you have.  · During "watchful waiting" periods, you should:  · Maintain a healthy weight.  · Eat a diet high in fiber (fruits, vegetables and whole grains).  · Drink plenty of fluids to avoid constipation. This means drinking enough water and other liquids to keep your urine clear or pale yellow.  · Do not lift heavy objects.  · Do not stand for long periods of time.  · Quit smoking. This should keep you from developing a frequent cough.  SEEK MEDICAL CARE IF:   · A bulge develops in your groin area.  · You feel pain, a burning sensation or pressure in the groin. This might be worse if you are lifting or straining.  · You develop a fever of more than 100.5° F (38.1° C).  SEEK IMMEDIATE MEDICAL CARE IF:   · Pain in the groin increases suddenly.  · A bulge in the groin gets bigger suddenly and does not go down.  · For men, there is sudden pain in the scrotum. Or, the size of the scrotum increases.  · A bulge in the groin area becomes red or purple and is painful to touch.  · You have nausea or vomiting that does not go away.  · You feel your heart beating much faster than normal.  · You cannot have a bowel movement or pass gas.  · You develop a fever of more than 102.0° F (38.9° C).  Document Released: 02/09/2009 Document Revised: 09/12/2011 Document Reviewed: 02/09/2009  ExitCare® Patient Information ©2012 [employer].

## 2012-06-12 ENCOUNTER — Encounter (HOSPITAL_BASED_OUTPATIENT_CLINIC_OR_DEPARTMENT_OTHER): Payer: Self-pay | Admitting: *Deleted

## 2012-06-12 NOTE — Progress Notes (Signed)
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

## 2012-06-16 ENCOUNTER — Ambulatory Visit (HOSPITAL_BASED_OUTPATIENT_CLINIC_OR_DEPARTMENT_OTHER)
Admission: RE | Admit: 2012-06-16 | Discharge: 2012-06-16 | Disposition: A | Discharge status: Home / Self Care | Payer: BC Managed Care – PPO | Source: Ambulatory Visit | Attending: Surgery | Admitting: Surgery

## 2012-06-16 ENCOUNTER — Encounter (HOSPITAL_BASED_OUTPATIENT_CLINIC_OR_DEPARTMENT_OTHER): Payer: Self-pay

## 2012-06-16 ENCOUNTER — Encounter (HOSPITAL_BASED_OUTPATIENT_CLINIC_OR_DEPARTMENT_OTHER): Payer: Self-pay | Admitting: Anesthesiology

## 2012-06-16 ENCOUNTER — Ambulatory Visit (HOSPITAL_BASED_OUTPATIENT_CLINIC_OR_DEPARTMENT_OTHER): Payer: BC Managed Care – PPO | Admitting: Anesthesiology

## 2012-06-16 ENCOUNTER — Encounter (HOSPITAL_BASED_OUTPATIENT_CLINIC_OR_DEPARTMENT_OTHER)
Admission: RE | Disposition: A | Discharge status: Home / Self Care | Payer: Self-pay | Source: Ambulatory Visit | Attending: Surgery

## 2012-06-16 DIAGNOSIS — K4090 Unilateral inguinal hernia, without obstruction or gangrene, not specified as recurrent: Secondary | ICD-10-CM

## 2012-06-16 HISTORY — PX: INGUINAL HERNIA REPAIR: SHX194

## 2012-06-16 HISTORY — PX: HERNIA REPAIR: SHX51

## 2012-06-16 LAB — POCT HEMOGLOBIN-HEMACUE: Hemoglobin: 13.8 g/dL (ref 13.0–17.0)

## 2012-06-16 SURGERY — REPAIR, HERNIA, INGUINAL, ADULT
Anesthesia: General | Site: Groin | Laterality: Left | Wound class: Clean

## 2012-06-16 MED ORDER — MIDAZOLAM HCL 2 MG/2ML IJ SOLN
1.0000 mg | INTRAMUSCULAR | Status: DC | PRN
  Administered 2012-06-16: 2 mg via INTRAVENOUS

## 2012-06-16 MED ORDER — ACETAMINOPHEN 325 MG PO TABS: 650.0000 mg | ORAL_TABLET | ORAL | Status: DC | PRN

## 2012-06-16 MED ORDER — SCOPOLAMINE 1 MG/3DAYS TD PT72
1.0000 | MEDICATED_PATCH | Freq: Once | TRANSDERMAL | Status: DC
  Administered 2012-06-16: 1.5 mg via TRANSDERMAL

## 2012-06-16 MED ORDER — POTASSIUM CHLORIDE IN NACL 20-0.45 MEQ/L-% IV SOLN: INTRAVENOUS | Status: DC

## 2012-06-16 MED ORDER — METOCLOPRAMIDE HCL 5 MG/ML IJ SOLN: 10.0000 mg | Freq: Once | INTRAMUSCULAR | Status: DC | PRN

## 2012-06-16 MED ORDER — PROPOFOL 10 MG/ML IV BOLUS
INTRAVENOUS | Status: DC | PRN
  Administered 2012-06-16: 200 mg via INTRAVENOUS

## 2012-06-16 MED ORDER — SODIUM CHLORIDE 0.9 % IJ SOLN: 3.0000 mL | Freq: Two times a day (BID) | INTRAMUSCULAR | Status: DC

## 2012-06-16 MED ORDER — LIDOCAINE HCL (CARDIAC) 20 MG/ML IV SOLN
INTRAVENOUS | Status: DC | PRN
  Administered 2012-06-16: 25 mg via INTRAVENOUS

## 2012-06-16 MED ORDER — OXYCODONE HCL 5 MG PO TABS: 5.0000 mg | ORAL_TABLET | ORAL | Status: DC | PRN

## 2012-06-16 MED ORDER — FENTANYL CITRATE 0.05 MG/ML IJ SOLN
INTRAMUSCULAR | Status: DC | PRN
  Administered 2012-06-16: 50 ug via INTRAVENOUS

## 2012-06-16 MED ORDER — FENTANYL CITRATE 0.05 MG/ML IJ SOLN
50.0000 ug | INTRAMUSCULAR | Status: DC | PRN
  Administered 2012-06-16: 100 ug via INTRAVENOUS

## 2012-06-16 MED ORDER — LACTATED RINGERS IV SOLN
INTRAVENOUS | Status: DC
  Administered 2012-06-16 (×2): via INTRAVENOUS

## 2012-06-16 MED ORDER — ACETAMINOPHEN 650 MG RE SUPP: 650.0000 mg | RECTAL | Status: DC | PRN

## 2012-06-16 MED ORDER — KETOROLAC TROMETHAMINE 30 MG/ML IJ SOLN
INTRAMUSCULAR | Status: DC | PRN
  Administered 2012-06-16: 30 mg via INTRAVENOUS

## 2012-06-16 MED ORDER — DEXAMETHASONE SODIUM PHOSPHATE 4 MG/ML IJ SOLN
INTRAMUSCULAR | Status: DC | PRN
  Administered 2012-06-16: 10 mg via INTRAVENOUS

## 2012-06-16 MED ORDER — ACETAMINOPHEN 10 MG/ML IV SOLN
1000.0000 mg | Freq: Once | INTRAVENOUS | Status: AC
  Administered 2012-06-16: 1000 mg via INTRAVENOUS

## 2012-06-16 MED ORDER — OXYCODONE HCL 5 MG/5ML PO SOLN: 5.0000 mg | Freq: Once | ORAL | Status: AC | PRN

## 2012-06-16 MED ORDER — HEPARIN SODIUM (PORCINE) 5000 UNIT/ML IJ SOLN
5000.0000 [IU] | Freq: Once | INTRAMUSCULAR | Status: AC
  Administered 2012-06-16: 5000 [IU] via SUBCUTANEOUS

## 2012-06-16 MED ORDER — MORPHINE SULFATE 2 MG/ML IJ SOLN: 1.0000 mg | INTRAMUSCULAR | Status: DC | PRN

## 2012-06-16 MED ORDER — BUPIVACAINE HCL (PF) 0.25 % IJ SOLN
INTRAMUSCULAR | Status: DC | PRN
  Administered 2012-06-16: 7 mL

## 2012-06-16 MED ORDER — OXYCODONE HCL 5 MG PO TABS
5.0000 mg | ORAL_TABLET | Freq: Once | ORAL | Status: AC | PRN
  Administered 2012-06-16: 5 mg via ORAL

## 2012-06-16 MED ORDER — SODIUM CHLORIDE 0.9 % IR SOLN
Status: DC | PRN
  Administered 2012-06-16: 1

## 2012-06-16 MED ORDER — CEFAZOLIN SODIUM-DEXTROSE 2-3 GM-% IV SOLR
2.0000 g | INTRAVENOUS | Status: AC
  Administered 2012-06-16: 2 g via INTRAVENOUS

## 2012-06-16 MED ORDER — HYDROMORPHONE HCL PF 1 MG/ML IJ SOLN: 0.2500 mg | INTRAMUSCULAR | Status: DC | PRN

## 2012-06-16 MED ORDER — BUPIVACAINE HCL (PF) 0.5 % IJ SOLN
INTRAMUSCULAR | Status: DC | PRN
  Administered 2012-06-16: 20 mL

## 2012-06-16 MED ORDER — GLYCOPYRROLATE 0.2 MG/ML IJ SOLN
INTRAMUSCULAR | Status: DC | PRN
  Administered 2012-06-16: 0.2 mg via INTRAVENOUS

## 2012-06-16 MED ORDER — CHLORHEXIDINE GLUCONATE 4 % EX LIQD: 1.0000 "application " | Freq: Once | CUTANEOUS | Status: DC

## 2012-06-16 MED ORDER — OXYCODONE-ACETAMINOPHEN 5-325 MG PO TABS: 1.0000 | ORAL_TABLET | ORAL | Status: AC | PRN

## 2012-06-16 MED ORDER — ONDANSETRON HCL 4 MG/2ML IJ SOLN
INTRAMUSCULAR | Status: DC | PRN
  Administered 2012-06-16: 4 mg via INTRAVENOUS

## 2012-06-16 MED ORDER — SODIUM CHLORIDE 0.9 % IJ SOLN: 3.0000 mL | INTRAMUSCULAR | Status: DC | PRN

## 2012-06-16 MED ORDER — SODIUM CHLORIDE 0.9 % IV SOLN: 250.0000 mL | INTRAVENOUS | Status: DC | PRN

## 2012-06-16 MED ORDER — ONDANSETRON HCL 4 MG/2ML IJ SOLN: 4.0000 mg | Freq: Four times a day (QID) | INTRAMUSCULAR | Status: DC | PRN

## 2012-06-16 SURGICAL SUPPLY — 49 items
BENZOIN TINCTURE PRP APPL 2/3 (GAUZE/BANDAGES/DRESSINGS) IMPLANT
BLADE SURG 15 STRL LF DISP TIS (BLADE) ×1 IMPLANT
BLADE SURG 15 STRL SS (BLADE) ×1
BLADE SURG ROTATE 9660 (MISCELLANEOUS) ×2 IMPLANT
CANISTER SUCTION 1200CC (MISCELLANEOUS) ×2 IMPLANT
CLEANER CAUTERY TIP 5X5 PAD (MISCELLANEOUS) ×1 IMPLANT
CLOTH BEACON ORANGE TIMEOUT ST (SAFETY) ×2 IMPLANT
COVER MAYO STAND STRL (DRAPES) ×2 IMPLANT
COVER TABLE BACK 60X90 (DRAPES) ×2 IMPLANT
DECANTER SPIKE VIAL GLASS SM (MISCELLANEOUS) IMPLANT
DERMABOND ADVANCED (GAUZE/BANDAGES/DRESSINGS) ×1
DERMABOND ADVANCED .7 DNX12 (GAUZE/BANDAGES/DRESSINGS) ×1 IMPLANT
DRAIN PENROSE 1/2X12 LTX STRL (WOUND CARE) ×2 IMPLANT
DRAPE LAPAROTOMY T 102X78X121 (DRAPES) ×2 IMPLANT
ELECT REM PT RETURN 9FT ADLT (ELECTROSURGICAL) ×2
ELECTRODE REM PT RTRN 9FT ADLT (ELECTROSURGICAL) ×1 IMPLANT
GAUZE SPONGE 4X4 12PLY STRL LF (GAUZE/BANDAGES/DRESSINGS) IMPLANT
GAUZE SPONGE 4X4 16PLY XRAY LF (GAUZE/BANDAGES/DRESSINGS) IMPLANT
GLOVE BIO SURGEON STRL SZ8 (GLOVE) ×2 IMPLANT
GLOVE SKINSENSE NS SZ7.0 (GLOVE) ×1
GLOVE SKINSENSE STRL SZ7.0 (GLOVE) ×1 IMPLANT
GOWN PREVENTION PLUS XLARGE (GOWN DISPOSABLE) ×2 IMPLANT
GOWN PREVENTION PLUS XXLARGE (GOWN DISPOSABLE) ×2 IMPLANT
MESH ULTRAPRO 3X6 7.6X15CM (Mesh General) ×2 IMPLANT
NEEDLE HYPO 25X1 1.5 SAFETY (NEEDLE) ×2 IMPLANT
NS IRRIG 1000ML POUR BTL (IV SOLUTION) ×2 IMPLANT
PACK BASIN DAY SURGERY FS (CUSTOM PROCEDURE TRAY) ×2 IMPLANT
PAD CLEANER CAUTERY TIP 5X5 (MISCELLANEOUS) ×1
PENCIL BUTTON HOLSTER BLD 10FT (ELECTRODE) ×2 IMPLANT
SLEEVE SCD COMPRESS KNEE MED (MISCELLANEOUS) ×2 IMPLANT
STAPLER VISISTAT 35W (STAPLE) IMPLANT
STRIP CLOSURE SKIN 1/2X4 (GAUZE/BANDAGES/DRESSINGS) IMPLANT
SUT MON AB 5-0 PS2 18 (SUTURE) ×2 IMPLANT
SUT PROLENE 0 CT 1 30 (SUTURE) IMPLANT
SUT PROLENE 2 0 CT2 30 (SUTURE) ×6 IMPLANT
SUT SILK 2 0 SH (SUTURE) ×2 IMPLANT
SUT VIC AB 2-0 SH 27 (SUTURE) ×1
SUT VIC AB 2-0 SH 27XBRD (SUTURE) ×1 IMPLANT
SUT VIC AB 4-0 SH 18 (SUTURE) ×2 IMPLANT
SUT VIC AB 5-0 P-3 18X BRD (SUTURE) IMPLANT
SUT VIC AB 5-0 P3 18 (SUTURE)
SUT VICRYL 4-0 PS2 18IN ABS (SUTURE) IMPLANT
SYR BULB 3OZ (MISCELLANEOUS) ×2 IMPLANT
SYR CONTROL 10ML LL (SYRINGE) ×2 IMPLANT
TOWEL OR 17X24 6PK STRL BLUE (TOWEL DISPOSABLE) ×2 IMPLANT
TRAY DSU PREP LF (CUSTOM PROCEDURE TRAY) ×2 IMPLANT
TUBE CONNECTING 20X1/4 (TUBING) ×2 IMPLANT
WATER STERILE IRR 1000ML POUR (IV SOLUTION) IMPLANT
YANKAUER SUCT BULB TIP NO VENT (SUCTIONS) ×2 IMPLANT

## 2012-06-16 NOTE — Anesthesia Procedure Notes (Addendum)
Anesthesia Regional Block:  TAP block  Pre-Anesthetic Checklist: ,, timeout performed, Correct Patient, Correct Site, Correct Laterality, Correct Procedure, Correct Position, site marked, Risks and benefits discussed,  Surgical consent,  Pre-op evaluation,  At surgeon's request and post-op pain management  Laterality: Left  Prep: chloraprep       Needles:  Injection technique: Single-shot  Needle Type: Echogenic Needle     Needle Length: 9cm  Needle Gauge: 21    Additional Needles:  Procedures: ultrasound guided TAP block Narrative:  Start time: 06/16/2012 2:50 PM End time: 06/16/2012 2:57 PM Injection made incrementally with aspirations every 5 mL.  Performed by: Personally  Anesthesiologist: Aldona Lento, MD  Additional Notes: Ultrasound guidance used to: id relevant anatomy, confirm needle position, local anesthetic spread, avoidance of vascular puncture. Picture saved. No complications. Block performed personally by Janetta Hora. Gelene Mink, MD    TAP block Procedure Name: LMA Insertion Date/Time: 06/16/2012 4:05 PM Performed by: Burna Cash Pre-anesthesia Checklist: Patient identified, Emergency Drugs available, Suction available and Patient being monitored Patient Re-evaluated:Patient Re-evaluated prior to inductionOxygen Delivery Method: Circle System Utilized Preoxygenation: Pre-oxygenation with 100% oxygen Intubation Type: IV induction Ventilation: Mask ventilation without difficulty LMA: LMA inserted LMA Size: 4.0 Number of attempts: 1 Airway Equipment and Method: bite block Placement Confirmation: positive ETCO2 Tube secured with: Tape Dental Injury: Teeth and Oropharynx as per pre-operative assessment

## 2012-06-16 NOTE — Transfer of Care (Signed)
Immediate Anesthesia Transfer of Care Note  Patient: Edwin Jimenez  Procedure(s) Performed: Procedure(s) (LRB) with comments: HERNIA REPAIR INGUINAL ADULT (Left) - left inguinal hernia repair with mesh  Patient Location: PACU  Anesthesia Type: General  Level of Consciousness: sedated and patient cooperative  Airway & Oxygen Therapy: Patient Spontanous Breathing and Patient connected to face mask oxygen  Post-op Assessment: Report given to PACU RN and Post -op Vital signs reviewed and stable  Post vital signs: Reviewed and stable  Complications: No apparent anesthesia complications

## 2012-06-16 NOTE — Anesthesia Postprocedure Evaluation (Signed)
Anesthesia Post Note  Patient: Edwin Jimenez  Procedure(s) Performed: Procedure(s) (LRB): HERNIA REPAIR INGUINAL ADULT (Left)  Anesthesia type: General  Patient location: PACU  Post pain: Pain level controlled  Post assessment: Patient's Cardiovascular Status Stable  Last Vitals:  Filed Vitals:   06/16/12 1745  BP: 123/68  Pulse: 53  Temp:   Resp: 13    Post vital signs: Reviewed and stable  Level of consciousness: alert  Complications: No apparent anesthesia complications

## 2012-06-16 NOTE — Anesthesia Preprocedure Evaluation (Signed)
Anesthesia Evaluation  Patient identified by MRN, date of birth, ID band Patient awake    Reviewed: Allergy & Precautions, H&P , NPO status , Patient's Chart, lab work & pertinent test results, reviewed documented beta blocker date and time   Airway Mallampati: II TM Distance: >3 FB Neck ROM: full    Dental   Pulmonary neg pulmonary ROS,  breath sounds clear to auscultation        Cardiovascular negative cardio ROS  Rhythm:regular     Neuro/Psych  Neuromuscular disease negative psych ROS   GI/Hepatic negative GI ROS, Neg liver ROS,   Endo/Other  negative endocrine ROS  Renal/GU negative Renal ROS  negative genitourinary   Musculoskeletal   Abdominal   Peds  Hematology negative hematology ROS (+)   Anesthesia Other Findings See surgeon's H&P   Reproductive/Obstetrics negative OB ROS                           Anesthesia Physical Anesthesia Plan  ASA: II  Anesthesia Plan: General   Post-op Pain Management:    Induction: Intravenous  Airway Management Planned: LMA  Additional Equipment:   Intra-op Plan:   Post-operative Plan: Extubation in OR  Informed Consent: I have reviewed the patients History and Physical, chart, labs and discussed the procedure including the risks, benefits and alternatives for the proposed anesthesia with the patient or authorized representative who has indicated his/her understanding and acceptance.   Dental Advisory Given  Plan Discussed with: CRNA and Surgeon  Anesthesia Plan Comments:         Anesthesia Quick Evaluation

## 2012-06-16 NOTE — H&P (Signed)
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 He has a left inguinal hernia that he is aware of. We discussed various approaches to management including doing nothing, repair now while he is recovering from a hamstring injury, or weight and repair it later. The important part of his history is he travels from [place] and often visits countries to may not be optimal for emergency surgery for an incarcerated hernia. His wife also had an incarcerated umbilical hernia couple years ago.  We discussed open and laparoscopic repair and my recommendation was for an open repair on the left side with mesh. The he is in a discuss timing with office no fluid [place] for left inguinal hernia repair.  Past Medical History   Diagnosis  Date   .  Dyslipidemia    .  ED (erectile dysfunction)     No past surgical history on file.  Current Outpatient Prescriptions   Medication  Sig  Dispense  Refill   .  aspirin 81 MG tablet  Take 81 mg by mouth daily.     Marland Kitchen  atorvastatin (LIPITOR) 20 MG tablet  Take 1 tablet (20 mg total) by mouth daily.  90 tablet  3   .  clindamycin (CLEOCIN T) 1 % lotion  Apply topically 2 (two) times daily.  60 mL  1   .  fish oil-omega-3 fatty acids 1000 MG capsule  Take 2 g by mouth daily.     .  tadalafil (CIALIS) 10 MG tablet  Take 0.5 tablets (5 mg total) by mouth as needed for erectile dysfunction.  30 tablet  0    Review of patient's allergies indicates no known allergies.  Family History   Problem  Relation  Age of Onset   .  Heart disease  Father     Social History: reports that he has never smoked. He does not have any smokeless tobacco history on file. He reports that he drinks alcohol. He reports that he does not use illicit drugs.  REVIEW OF SYSTEMS - PERTINENT POSITIVES ONLY:  noncontributory  Physical Exam:  Blood pressure  110/68, pulse 60, temperature 97.4 F (36.3 C), temperature source Temporal, resp. rate 14, height 5\' 8"  (1.727 m), weight 139 lb (63.05 kg).  Body mass index is 21.13 kg/(m^2).  Gen: WDWN WM NAD  Neurological: Alert and oriented to person, place, and time. Motor and sensory function is grossly intact  Head: Normocephalic and atraumatic.  Eyes: Conjunctivae are normal. Pupils are equal, round, and reactive to light. No scleral icterus.  Neck: Normal range of motion. Neck supple. No tracheal deviation or thyromegaly present.  Cardiovascular: SR without murmurs or gallops. No carotid bruits  Respiratory: Effort normal. No respiratory distress. No chest wall tenderness. Breath sounds normal. No wheezes, rales or rhonchi.  Abdomen: nontender  GU: Lipoma on the right, left inguinal hernia that is reducible  Musculoskeletal: Normal range of motion. Extremities are nontender. No cyanosis, edema or clubbing noted Lymphadenopathy: No cervical, preauricular, postauricular or axillary adenopathy is present Skin: Skin is warm and dry. No rash noted. No diaphoresis. No erythema. No pallor. Numerous lipomata Pscyh: Normal mood and affect. Behavior is normal. Judgment and thought content normal.  LABORATORY RESULTS:  No results found for this or any previous visit (from the past 48 hour(s)).  RADIOLOGY RESULTS:  No results found.  Problem List:  Patient Active Problem List   Diagnosis   .  KNEE PAIN, RIGHT   .  CALF PAIN, BILATERAL   .  HALLUX RIGIDUS   .  MUSCLE STRAIN, HAMSTRING MUSCLE   .  Family history of ischemic heart disease (IHD)   .  Hyperlipidemia LDL goal < 70   .  ED (erectile dysfunction)   .  Strain of hip adductor muscle   .  Inguinal hernia, left    Assessment & Plan:  Left inguinal hernia in a runner currently recovering from left hamstring injury.  Open LIH repair  Matt B. Daphine Deutscher, MD, [facility]  [phone] beeper  [phone]

## 2012-06-16 NOTE — Op Note (Signed)
Surgeon: Wenda Low, MD, FACS  Asst:  none  Anes:  gen lma tap block  Procedure: Open left inguinal hernia repair with Ultrapro mesh for indirect inguinal hernia  Diagnosis: Left indirect inguinal hernia  Complications: none  EBL:   Minimal  cc  Description of Procedure:  Taken to OR 1 at [facility] and given general.  Prior TAP block by Dr. Gelene Mink.  Prepped with pcmx and timeout.  Small oblique incision exposing external oblique.  Fascial separation resembling a "sports hernia" with spaying of external oblique at ext ring.  Cord mobilized and indirect sac readily found and mobilized from cord.  Open sac and separate and suture high ligation with 2-0 silk.  2 suture closure of the internal ring medially.  Ultrapro mesh cut to fit and sewn with running 2-0 prolene along inguinal ligament; interupted medially.  Brought around the cord and tucked laterally.  Ext oblique closed with 2-0 vicryl.  4-0 vicryl and 5-0 monocryl on the skin.  Dermabond.  Tolerated well and taken to the PACU in stable condition.    Matt B. Daphine Deutscher, MD, [facility], [place] [phone]

## 2012-06-16 NOTE — Progress Notes (Signed)
Assisted Dr. Frederick with left, ultrasound guided, transabdominal plane block. Side rails up, monitors on throughout procedure. See vital signs in flow sheet. Tolerated Procedure well. 

## 2012-06-17 ENCOUNTER — Telehealth (INDEPENDENT_AMBULATORY_CARE_PROVIDER_SITE_OTHER): Payer: Self-pay | Admitting: General Surgery

## 2012-06-17 ENCOUNTER — Encounter (HOSPITAL_BASED_OUTPATIENT_CLINIC_OR_DEPARTMENT_OTHER): Payer: Self-pay | Admitting: Surgery

## 2012-06-17 NOTE — Telephone Encounter (Signed)
Pt called to report he is only "trickling" urine today.  He had open [facility] repair with a nerve block.  He was only "dribbling" urine last night, and is still not fully voiding.  He reports still has numbness at site of surgery.  Paged and updated Dr. Daphine Deutscher; ordered referral to Dr. Reece Packer office, as pt was seen there previously.  Called [facility] and made referral. Notified pt of 1:00 appt today.

## 2012-06-18 ENCOUNTER — Ambulatory Visit (INDEPENDENT_AMBULATORY_CARE_PROVIDER_SITE_OTHER): Payer: Self-pay | Admitting: Surgery

## 2012-06-23 ENCOUNTER — Telehealth (INDEPENDENT_AMBULATORY_CARE_PROVIDER_SITE_OTHER): Payer: Self-pay | Admitting: General Surgery

## 2012-06-23 NOTE — Telephone Encounter (Signed)
LMOM letting pt know that his first PO appt w/ Dr. Daphine Deutscher will be on 9/20 at 2:20.

## 2012-06-23 NOTE — Telephone Encounter (Signed)
Message copied by Littie Deeds on Tue Jun 23, 2012  3:17 PM ------      Message from: Marlowe Aschoff      Created: Mon Jun 22, 2012  3:21 PM                   ----- Message -----         From: Zacarias Pontes         Sent: 06/22/2012   8:43 AM           To: Marlowe Aschoff, RN            Pt needs 2-3 week po apt please call his cell at [phone].

## 2012-06-26 ENCOUNTER — Ambulatory Visit (INDEPENDENT_AMBULATORY_CARE_PROVIDER_SITE_OTHER): Payer: BC Managed Care – PPO | Admitting: Surgery

## 2012-06-26 ENCOUNTER — Encounter (INDEPENDENT_AMBULATORY_CARE_PROVIDER_SITE_OTHER): Payer: Self-pay | Admitting: Surgery

## 2012-06-26 VITALS — BP 110/74 | HR 66 | Temp 98.5°F | Ht 68.0 in | Wt 139.2 lb

## 2012-06-26 DIAGNOSIS — Z09 Encounter for follow-up examination after completed treatment for conditions other than malignant neoplasm: Secondary | ICD-10-CM

## 2012-06-26 NOTE — Patient Instructions (Signed)
Thanks for your patience.  If you need further assistance after leaving the office, please call our office and speak with a [facility] nurse.  [phone].  If you want to leave a message for Dr. Atharva Mirsky, please call his office phone at [phone]. 

## 2012-06-26 NOTE — Progress Notes (Signed)
Edwin Jimenez 61 y.o.  Body mass index is 21.17 kg/(m^2).  Patient Active Problem List  Diagnosis  . KNEE PAIN, RIGHT  . CALF PAIN, BILATERAL  . HALLUX RIGIDUS  . MUSCLE STRAIN, HAMSTRING MUSCLE  . Family history of ischemic heart disease (IHD)  . Hyperlipidemia LDL goal < 70  . ED (erectile dysfunction)  . Strain of hip adductor muscle  . Inguinal hernia, left    No Known Allergies  Past Surgical History  Procedure Date  . Colonoscopy     x2  . Inguinal hernia repair 06/16/2012    Procedure: HERNIA REPAIR INGUINAL ADULT;  Surgeon: Valarie Merino, MD;  Location: [facility];  Service: General;  Laterality: Left;  left inguinal hernia repair with mesh  . Hernia repair 06/16/12    [facility]   Carollee Herter, MD No diagnosis found.  Left inguinal repair is intact.  Increasing activity.  No problems voiding.  Return 6 weeks.   Matt B. Daphine Deutscher, MD, [facility] [phone] beeper [phone]  06/26/2012 2:31 PM

## 2012-07-21 ENCOUNTER — Encounter: Payer: Self-pay | Admitting: Sports Medicine

## 2012-07-21 ENCOUNTER — Ambulatory Visit (INDEPENDENT_AMBULATORY_CARE_PROVIDER_SITE_OTHER): Payer: BC Managed Care – PPO | Admitting: Sports Medicine

## 2012-07-21 ENCOUNTER — Telehealth (INDEPENDENT_AMBULATORY_CARE_PROVIDER_SITE_OTHER): Payer: Self-pay | Admitting: General Surgery

## 2012-07-21 VITALS — BP 119/72 | HR 51 | Ht 69.0 in | Wt 138.0 lb

## 2012-07-21 DIAGNOSIS — S76019A Strain of muscle, fascia and tendon of unspecified hip, initial encounter: Secondary | ICD-10-CM

## 2012-07-21 DIAGNOSIS — IMO0002 Reserved for concepts with insufficient information to code with codable children: Secondary | ICD-10-CM

## 2012-07-21 DIAGNOSIS — S838X9A Sprain of other specified parts of unspecified knee, initial encounter: Secondary | ICD-10-CM

## 2012-07-21 NOTE — Assessment & Plan Note (Signed)
Injury seems clinically and on exam to have resolved  He is to continue to do some home exercises  He should continue to use compression sleeve is because the rate of recurrence is so high  Recheck with me when necessary

## 2012-07-21 NOTE — Progress Notes (Signed)
  Subjective:    Patient ID: Edwin Jimenez, male    DOB: [birth year], 61 y.o.   MRN: [id number]  HPI  Pt presents to clinic for f/u of L adductor and HS which is 75% improved. Continues with PT with Amado Coe, which is very helpful. Still has occasional adductor and HS discomfort, but this is mild. Started running again 2.5 weeks ago, 30 mpw. Yesterday started having rt hamstring pain after 4 miles of running. Using bodyhelix thigh sleeve, but not consistently.   Patient had some right hamstring tightness after running yesterday  Review of Systems     Objective:   Physical Exam  No acute distress   Hamstring strength is normal and equal bilaterally Adductor strength is normal although he does feel some mild pain in the left groin when checking either the right or left adductor Hip abduction strength is normal bilaterally  Gait is normal  MSK ultrasound Is a slight amount of edema At the insertion of both hamstring muscles at ischiall tuberosity There is no sign of the tendon tear At the left ischial tuberosity there is a slight irregularity but no calcium or fragment noted on this exam Doppler activity is normal   Adductor tendons show very slight hypoechoic change at the insertion this is equal bilaterally      Assessment & Plan:

## 2012-07-21 NOTE — Patient Instructions (Addendum)
Check out Askling protocol on you tube  Consider doing this 3 times per week  Keep up some adductor exercises with the inside leg lift probably best  Use compression for running on both legs  Check with me 3 months or as needed

## 2012-07-21 NOTE — Assessment & Plan Note (Signed)
Some pain in the adductor muscles but he also had a hernia repair on that side  His strength is good in the should not limit him from running

## 2012-07-21 NOTE — Telephone Encounter (Signed)
LMOM letting pt know that I had to reschedule his appt w/ Dr. Daphine Deutscher from 11/8 to 11/20 at 4:20.

## 2012-07-22 ENCOUNTER — Telehealth (INDEPENDENT_AMBULATORY_CARE_PROVIDER_SITE_OTHER): Payer: Self-pay | Admitting: General Surgery

## 2012-07-22 NOTE — Telephone Encounter (Signed)
LMOM asking pt to return my call so I can reschedule his appt w/ Dr. Daphine Deutscher.

## 2012-07-22 NOTE — Telephone Encounter (Signed)
Message copied by Littie Deeds on Wed Jul 22, 2012 10:48 AM ------      Message from: Zacarias Pontes      Created: Wed Jul 22, 2012  8:45 AM       Pt cant make the po apt you set him up for,he has all day meetings,pls r/s and give him a call at work [phone]

## 2012-07-22 NOTE — Telephone Encounter (Signed)
Spoke with pt and scheduled him for 12/12.  This was all that Dr. Daphine Deutscher had open that also worked with the patients work schedule.  I informed him that if he has any problems between now and then to call me and I would work him into our clinic.

## 2012-08-14 ENCOUNTER — Encounter (INDEPENDENT_AMBULATORY_CARE_PROVIDER_SITE_OTHER): Payer: BC Managed Care – PPO | Admitting: Surgery

## 2012-08-26 ENCOUNTER — Encounter (INDEPENDENT_AMBULATORY_CARE_PROVIDER_SITE_OTHER): Payer: BC Managed Care – PPO | Admitting: Surgery

## 2012-09-17 ENCOUNTER — Encounter (INDEPENDENT_AMBULATORY_CARE_PROVIDER_SITE_OTHER): Payer: Self-pay | Admitting: Surgery

## 2012-09-17 ENCOUNTER — Ambulatory Visit (INDEPENDENT_AMBULATORY_CARE_PROVIDER_SITE_OTHER): Payer: BC Managed Care – PPO | Admitting: Surgery

## 2012-09-17 VITALS — BP 106/74 | HR 54 | Temp 97.6°F | Resp 16 | Ht 68.0 in | Wt 139.2 lb

## 2012-09-17 DIAGNOSIS — Z09 Encounter for follow-up examination after completed treatment for conditions other than malignant neoplasm: Secondary | ICD-10-CM

## 2012-09-17 NOTE — Patient Instructions (Signed)
Thanks for your patience.  If you need further assistance after leaving the office, please call our office and speak with a [facility] nurse.  [phone].  If you want to leave a message for Dr. Alexandar Weisenberger, please call his office phone at [phone]. 

## 2012-09-17 NOTE — Progress Notes (Signed)
Logun P Knisley 61 y.o.  Body mass index is 21.17 kg/(m^2).  Patient Active Problem List  Diagnosis  . KNEE PAIN, RIGHT  . CALF PAIN, BILATERAL  . HALLUX RIGIDUS  . MUSCLE STRAIN, HAMSTRING MUSCLE  . Family history of ischemic heart disease (IHD)  . Hyperlipidemia LDL goal < 70  . ED (erectile dysfunction)  . Strain of hip adductor muscle  . S/P inguinal hernia repair, follow-up exam    No Known Allergies  Past Surgical History  Procedure Date  . Colonoscopy     x2  . Inguinal hernia repair 06/16/2012    Procedure: HERNIA REPAIR INGUINAL ADULT;  Surgeon: Valarie Merino, MD;  Location: [facility];  Service: General;  Laterality: Left;  left inguinal hernia repair with mesh  . Hernia repair 06/16/12    [facility]   Carollee Herter, MD No diagnosis found.  Left inguinal hernia incision has healed and he is back running-hoping to do well in next yearl's [employer].   I'll see him prn. Matt B. Daphine Deutscher, MD, [facility] [phone] beeper [phone]  09/17/2012 12:29 PM

## 2012-11-26 ENCOUNTER — Other Ambulatory Visit: Payer: Self-pay | Admitting: Family Medicine

## 2012-12-01 ENCOUNTER — Encounter: Payer: Self-pay | Admitting: Family Medicine

## 2013-03-10 ENCOUNTER — Encounter: Payer: Self-pay | Admitting: Family Medicine

## 2013-03-10 ENCOUNTER — Ambulatory Visit (INDEPENDENT_AMBULATORY_CARE_PROVIDER_SITE_OTHER): Payer: BC Managed Care – PPO | Admitting: Family Medicine

## 2013-03-10 VITALS — BP 112/70 | HR 56 | Ht 67.0 in | Wt 137.0 lb

## 2013-03-10 DIAGNOSIS — E785 Hyperlipidemia, unspecified: Secondary | ICD-10-CM

## 2013-03-10 DIAGNOSIS — N529 Male erectile dysfunction, unspecified: Secondary | ICD-10-CM

## 2013-03-10 DIAGNOSIS — Z0000 Encounter for general adult medical examination without abnormal findings: Secondary | ICD-10-CM

## 2013-03-10 DIAGNOSIS — Z8249 Family history of ischemic heart disease and other diseases of the circulatory system: Secondary | ICD-10-CM

## 2013-03-10 DIAGNOSIS — Z2911 Encounter for prophylactic immunotherapy for respiratory syncytial virus (RSV): Secondary | ICD-10-CM

## 2013-03-10 DIAGNOSIS — Z125 Encounter for screening for malignant neoplasm of prostate: Secondary | ICD-10-CM

## 2013-03-10 LAB — CBC WITH DIFFERENTIAL/PLATELET
Basophils Absolute: 0 10*3/uL (ref 0.0–0.1)
Basophils Relative: 1 % (ref 0–1)
Eosinophils Absolute: 0.1 10*3/uL (ref 0.0–0.7)
Eosinophils Relative: 2 % (ref 0–5)
HCT: 42.4 % (ref 39.0–52.0)
Hemoglobin: 14.6 g/dL (ref 13.0–17.0)
Lymphocytes Relative: 41 % (ref 12–46)
Lymphs Abs: 1.4 10*3/uL (ref 0.7–4.0)
MCH: 32.2 pg (ref 26.0–34.0)
MCHC: 34.4 g/dL (ref 30.0–36.0)
MCV: 93.6 fL (ref 78.0–100.0)
Monocytes Absolute: 0.3 10*3/uL (ref 0.1–1.0)
Monocytes Relative: 8 % (ref 3–12)
Neutro Abs: 1.7 10*3/uL (ref 1.7–7.7)
Neutrophils Relative %: 48 % (ref 43–77)
Platelets: 188 10*3/uL (ref 150–400)
RBC: 4.53 MIL/uL (ref 4.22–5.81)
RDW: 13.4 % (ref 11.5–15.5)
WBC: 3.4 10*3/uL — ABNORMAL LOW (ref 4.0–10.5)

## 2013-03-10 MED ORDER — ATORVASTATIN CALCIUM 20 MG PO TABS: ORAL_TABLET | ORAL | Status: DC

## 2013-03-10 NOTE — Progress Notes (Signed)
  Subjective:    Patient ID: Edwin Jimenez, male    DOB: 03-19-1951, 62 y.o.   MRN: [id number]  HPI He is here for complete examination. He continues on Lipitor and is having no difficulty with this. He does have slight difficulty with erectile dysfunction. He did have difficulty last year with various orthopedic in a running related problems however he was able to run in the [facility]. He did not run as fast as he would like but he did finish.He otherwise has no particular concerns or complaints. 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 He is distressed over job security right now but is in the process of looking for other opportunities. He would like to stay in this area. Family and social history was reviewed and is unchanged   Review of Systems Negative except as above    Objective:   Physical Exam BP 112/70  Pulse 56  Ht 5\' 7"  (1.702 m)  Wt 137 lb (62.143 kg)  BMI 21.45 kg/m2  General Appearance:    Alert, cooperative, no distress, appears stated age  Head:    Normocephalic, without obvious abnormality, atraumatic  Eyes:    PERRL, conjunctiva/corneas clear, EOM's intact, fundi    benign  Ears:    Normal TM's and external ear canals  Nose:   Nares normal, mucosa normal, no drainage or sinus   tenderness  Throat:   Lips, mucosa, and tongue normal; teeth and gums normal  Neck:   Supple, no lymphadenopathy;  thyroid:  no   enlargement/tenderness/nodules; no carotid   bruit or JVD  Back:    Spine nontender, no curvature, ROM normal, no CVA     tenderness  Lungs:     Clear to auscultation bilaterally without wheezes, rales or     ronchi; respirations unlabored  Chest Wall:    No tenderness or deformity   Heart:    Regular rate and rhythm, S1 and S2 normal, no murmur, rub   or gallop  Breast Exam:    No chest wall tenderness, masses or gynecomastia  Abdomen:     Soft, non-tender, nondistended, normoactive bowel sounds,    no masses, no hepatosplenomegaly  Genitalia:   deferred  Rectal:  deferred  Extremities:   No clubbing, cyanosis or edema  Pulses:   2+ and symmetric all extremities  Skin:   Skin color, texture, turgor normal, no rashes or lesions  Lymph nodes:   Cervical, supraclavicular, and axillary nodes normal  Neurologic:   CNII-XII intact, normal strength, sensation and gait; reflexes 2+ and symmetric throughout          Psych:   Normal mood, affect, hygiene and grooming.          Assessment & Plan:  Family history of ischemic heart disease (IHD) - Plan: Lipid panel  Hyperlipidemia LDL goal < 70 - Plan: Lipid panel, atorvastatin (LIPITOR) 20 MG tablet  ED (erectile dysfunction)  Routine general medical examination at a health care facility - Plan: Varicella-zoster vaccine subcutaneous, CBC with Differential, Comprehensive metabolic panel, Lipid panel, PSA  Special screening for malignant neoplasm of prostate - Plan: PSA

## 2013-03-11 ENCOUNTER — Encounter: Payer: Self-pay | Admitting: Family Medicine

## 2013-03-11 LAB — COMPREHENSIVE METABOLIC PANEL
ALT: 60 U/L — ABNORMAL HIGH (ref 0–53)
AST: 45 U/L — ABNORMAL HIGH (ref 0–37)
Albumin: 3.9 g/dL (ref 3.5–5.2)
Alkaline Phosphatase: 60 U/L (ref 39–117)
BUN: 16 mg/dL (ref 6–23)
CO2: 27 mEq/L (ref 19–32)
Calcium: 8.8 mg/dL (ref 8.4–10.5)
Chloride: 106 mEq/L (ref 96–112)
Creat: 0.95 mg/dL (ref 0.50–1.35)
Glucose, Bld: 92 mg/dL (ref 70–99)
Potassium: 4.7 mEq/L (ref 3.5–5.3)
Sodium: 139 mEq/L (ref 135–145)
Total Bilirubin: 0.7 mg/dL (ref 0.3–1.2)
Total Protein: 6 g/dL (ref 6.0–8.3)

## 2013-03-11 LAB — LIPID PANEL
Cholesterol: 125 mg/dL (ref 0–200)
HDL: 47 mg/dL (ref >39)
LDL Cholesterol: 70 mg/dL (ref 0–99)
Total CHOL/HDL Ratio: 2.7 Ratio
Triglycerides: 38 mg/dL (ref <150)
VLDL: 8 mg/dL (ref 0–40)

## 2013-03-11 LAB — PSA: PSA: 0.45 ng/mL (ref <=4.00)

## 2013-03-11 NOTE — Progress Notes (Signed)
Quick Note:  CALLED PT CELL LEFT MESSAGE WORD FOR WORD Lipids look good. The enzymes are up slightly and nothing to worry about ______

## 2013-03-12 ENCOUNTER — Telehealth: Payer: Self-pay | Admitting: Internal Medicine

## 2013-03-12 NOTE — Telephone Encounter (Signed)
Pt would like his labs that he had done released into mychart.

## 2013-03-13 NOTE — Telephone Encounter (Signed)
Release them

## 2013-03-15 NOTE — Telephone Encounter (Signed)
Will reply to patient stating they are in mychart now

## 2013-05-27 ENCOUNTER — Encounter: Payer: Self-pay | Admitting: Sports Medicine

## 2013-05-27 ENCOUNTER — Ambulatory Visit (INDEPENDENT_AMBULATORY_CARE_PROVIDER_SITE_OTHER): Payer: BC Managed Care – PPO | Admitting: Sports Medicine

## 2013-05-27 VITALS — BP 115/73 | HR 46 | Ht 67.0 in | Wt 137.0 lb

## 2013-05-27 DIAGNOSIS — S838X9A Sprain of other specified parts of unspecified knee, initial encounter: Secondary | ICD-10-CM

## 2013-05-27 DIAGNOSIS — M25562 Pain in left knee: Secondary | ICD-10-CM

## 2013-05-27 DIAGNOSIS — M79605 Pain in left leg: Secondary | ICD-10-CM

## 2013-05-27 DIAGNOSIS — S86819D Strain of other muscle(s) and tendon(s) at lower leg level, unspecified leg, subsequent encounter: Secondary | ICD-10-CM | POA: Insufficient documentation

## 2013-05-27 DIAGNOSIS — M25569 Pain in unspecified knee: Secondary | ICD-10-CM

## 2013-05-27 DIAGNOSIS — S86111A Strain of other muscle(s) and tendon(s) of posterior muscle group at lower leg level, right leg, initial encounter: Secondary | ICD-10-CM

## 2013-05-27 DIAGNOSIS — M79609 Pain in unspecified limb: Secondary | ICD-10-CM

## 2013-05-27 DIAGNOSIS — S86112A Strain of other muscle(s) and tendon(s) of posterior muscle group at lower leg level, left leg, initial encounter: Secondary | ICD-10-CM

## 2013-05-27 NOTE — Progress Notes (Signed)
Patient ID: Edwin Jimenez, male   DOB: Sep 05, 1951, 62 y.o.   MRN: [id number] Is a 63 year old male with a complaint of left calf pain. Symptom onset was 5 days ago. He was running and there is slight pain in the distal lateral aspect of his calf. Pain is worse with walking and running. It is also difficult to go up and down stairs with this pain. He tried running again 4 days ago the pain was too severe to continue running. He has not taken any medication since then. He has done exercises and really exercise bike with little difficulty. The pain has steadily improved over the last 4 days. No radiation of pain. The pain is similar to a prior gastrocnemius tear but not as severe.  Past Medical History  Diagnosis Date  . Dyslipidemia   . ED (erectile dysfunction)    Past Surgical History  Procedure Laterality Date  . Colonoscopy      x2  . Inguinal hernia repair  06/16/2012    Procedure: HERNIA REPAIR INGUINAL ADULT;  Surgeon: Valarie Merino, MD;  Location: [facility];  Service: General;  Laterality: Left;  left inguinal hernia repair with mesh  . Hernia repair  06/16/12    [facility]   No Known Allergies  Review of systems as per history of present illness otherwise negative  Examination: BP 115/73  Pulse 46  Ht 5\' 7"  (1.702 m)  Wt 137 lb (62.143 kg)  BMI 21.45 kg/m2 This is a well-developed well-nourished pleasant 63 year old male awake alert oriented in no acute distress  Left Calf exam: 5 over 5 strength to plantar and dorsiflexion. Tenderness noted the distal portion of the lateral calf in the region of the soleus muscle No ecchymosis or swelling noted Normal Thompson test  Neurovascularly intact bilateral lower extremities with equal pulses.  Musculoskeletal ultrasound of the left Achilles tendon, gastrocnemius and soleus muscles was performed. Images obtained reveal an intact Achilles tendon. There is evidence of fluid within the soleus muscle on the lateral side at the  distal end of the lateral head of the gastrocnemius. There is no evidence of acute tear visualized.

## 2013-05-27 NOTE — Assessment & Plan Note (Signed)
History exam and ultrasound consistent with a soleus muscle strain on the left. He is given heel lifts in a body helix leave today in the office. We'll withhold running for several more days and then advance slowly as tolerated. He'll perform soleus and gastroc stretching at home. Followup in the office in 3 weeks. If symptoms do not improve in the next one week or so he will call and we'll discuss topical nitroglycerin therapy at that time.

## 2013-06-17 ENCOUNTER — Encounter: Payer: Self-pay | Admitting: Sports Medicine

## 2013-06-17 ENCOUNTER — Ambulatory Visit (INDEPENDENT_AMBULATORY_CARE_PROVIDER_SITE_OTHER): Payer: BC Managed Care – PPO | Admitting: Sports Medicine

## 2013-06-17 VITALS — BP 114/75 | HR 46 | Ht 67.0 in | Wt 137.0 lb

## 2013-06-17 DIAGNOSIS — IMO0002 Reserved for concepts with insufficient information to code with codable children: Secondary | ICD-10-CM

## 2013-06-17 DIAGNOSIS — S86112S Strain of other muscle(s) and tendon(s) of posterior muscle group at lower leg level, left leg, sequela: Secondary | ICD-10-CM

## 2013-06-17 NOTE — Progress Notes (Signed)
  Subjective:    Patient ID: Edwin Jimenez, male    DOB: [birth year], 62 y.o.   MRN: [id number]  HPI Patient comes in today for followup on left calf pain. Overall, symptoms have improved. He is return to running but is taking it easy. He has been doing his eccentric home exercises. He has been wearing his body helix compression sleeve. He has found the heel lifts to be a little uncomfortable. He has not noticed any swelling or bruising in his calf.    Review of Systems     Objective:   Physical Exam Developed, well-nourished. No acute distress.  There is still some slight tenderness to palpation along the distal portion of the lateral calf in the region of the soleus muscle. No ecchymosis, no swelling. No palpable defect.  A quick MSK ultrasound of this area still shows some persistent hypoechoic changes in the soleus consistent with a strain       Assessment & Plan:  Improving left calf strain  Overall patient is making progress. He is back running albeit not at the intensity or distance that he would like. However, he is running up to 40 miles a week. I think he can slowly increase his mileage over the next 3 weeks before increasing his intensity. We'll continue with his eccentric exercises and walk had some soleus and gastroc stretches in 3 weeks time. Continue to run with his compression sleeve. I explained to him that it will likely take another 3 weeks or so before he is able to return to the level of running that he would like. He will followup when necessary.

## 2013-06-18 ENCOUNTER — Telehealth: Payer: Self-pay | Admitting: Medical

## 2013-06-18 ENCOUNTER — Other Ambulatory Visit: Payer: Self-pay | Admitting: Medical

## 2013-06-18 MED ORDER — NYSTATIN 100000 UNIT/ML MT SUSP: 500000.0000 [IU] | Freq: Four times a day (QID) | OROMUCOSAL | Status: DC

## 2013-06-18 NOTE — Telephone Encounter (Signed)
Unless they have been on antibiotic recently or have some other reason to have had thrush, have them f/u in 2wk.  Nystatin oral solution sent

## 2013-06-21 NOTE — Telephone Encounter (Signed)
Pt informed of Shane's instructions he is to call & schedule appt sooner if no better

## 2013-07-05 ENCOUNTER — Telehealth: Payer: Self-pay | Admitting: Family Medicine

## 2013-07-05 NOTE — Telephone Encounter (Signed)
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 I am sending form back ion your folder. Please call pt at [phone] when complete.

## 2013-07-06 ENCOUNTER — Ambulatory Visit (INDEPENDENT_AMBULATORY_CARE_PROVIDER_SITE_OTHER): Payer: BC Managed Care – PPO | Admitting: Family Medicine

## 2013-07-06 ENCOUNTER — Encounter: Payer: Self-pay | Admitting: Family Medicine

## 2013-07-06 VITALS — BP 116/74 | HR 50 | Temp 97.7°F | Wt 138.0 lb

## 2013-07-06 DIAGNOSIS — J069 Acute upper respiratory infection, unspecified: Secondary | ICD-10-CM

## 2013-07-06 MED ORDER — AMOXICILLIN 875 MG PO TABS: 875.0000 mg | ORAL_TABLET | Freq: Two times a day (BID) | ORAL | Status: DC

## 2013-07-06 NOTE — Progress Notes (Signed)
  Subjective:    Patient ID: Edwin Jimenez, male    DOB: [birth year], 62 y.o.   MRN: [id number]  HPI Complains of a 4 day history of sore throat, earache, PND, sinus congestion and now cough. He is getting ready to go on a business trip and in another week will be going to [place] for his daughter's wedding. He also is a regular runner. He does not smoke and has no allergies.  Review of Systems     Objective:   Physical Exam alert and in no distress. Tympanic membranes and canals are normal. Throat is clear. Tonsils are normal. Neck is supple without adenopathy or thyromegaly. Cardiac exam shows a regular sinus rhythm without murmurs or gallops. Lungs are clear to auscultation.        Assessment & Plan:  URI, acute - Plan: amoxicillin (AMOXIL) 875 MG tablet  although he is early in the process, his impending trip I think justify given a medication just to be safe. He recognizes that this could be viral in his in agreement with our treatment plan.

## 2013-08-12 ENCOUNTER — Other Ambulatory Visit: Payer: Self-pay

## 2013-09-08 ENCOUNTER — Ambulatory Visit (INDEPENDENT_AMBULATORY_CARE_PROVIDER_SITE_OTHER): Payer: BC Managed Care – PPO | Admitting: Family Medicine

## 2013-09-08 ENCOUNTER — Encounter: Payer: Self-pay | Admitting: Family Medicine

## 2013-09-08 VITALS — BP 112/68 | HR 57 | Ht 68.0 in | Wt 137.0 lb

## 2013-09-08 DIAGNOSIS — S86012A Strain of left Achilles tendon, initial encounter: Secondary | ICD-10-CM

## 2013-09-08 DIAGNOSIS — S93499A Sprain of other ligament of unspecified ankle, initial encounter: Secondary | ICD-10-CM

## 2013-09-08 NOTE — Patient Instructions (Signed)
You have an achilles strain. Tylenol or aleve as needed for pain Calf raises with 2 feet 3 sets of 10 once a day for next few days.  Then one footed 3 sets of 10 once a day.  Finally on a step as directed.  Ok to continue through this progression as long as pain stays 1-2 out of 10. Can add heel walks, toe walks forward and backward as well Ice bucket 10-15 minutes at end of day - can ice 3-4 times a day. Avoid uneven ground, hills as much as possible. Heel lifts in shoes over next 4-6 weeks. Consider Physical therapy if not improving. Nitro patches 1/4 of 0.2mg /hr patch and change daily Cross train for next 1-2 weeks. When going back to running do half of your normal activities, cut pace down to 50-75%; avoid hills when you do. Increase by about 10% per week until you get back to your usual program. Follow up in 6 weeks.

## 2013-09-09 ENCOUNTER — Telehealth: Payer: Self-pay | Admitting: Family Medicine

## 2013-09-09 ENCOUNTER — Encounter: Payer: Self-pay | Admitting: Family Medicine

## 2013-09-09 DIAGNOSIS — S86012A Strain of left Achilles tendon, initial encounter: Secondary | ICD-10-CM

## 2013-09-09 DIAGNOSIS — S86019A Strain of unspecified Achilles tendon, initial encounter: Secondary | ICD-10-CM | POA: Insufficient documentation

## 2013-09-09 MED ORDER — NITROGLYCERIN 0.2 MG/HR TD PT24: MEDICATED_PATCH | TRANSDERMAL | Status: DC

## 2013-09-09 NOTE — Assessment & Plan Note (Signed)
Left achilles strain - at musculotendinous junction.  Discussed treatment - tylenol/nsaids, calf raise exercise (restart this), icing, heel lifts.  He has some nitro left over from prior injury - to use this for achilles (risks of headaches, skin irritation, and advised no cialis while using this).  Cross train for next 1-2 weeks then discussed return to running.  Consider PT, orthotics if not improving.  F/u in 6 weeks.

## 2013-09-09 NOTE — Progress Notes (Signed)
Patient ID: Edwin Jimenez, male   DOB: 10/15/1950, 62 y.o.   MRN: [id number]  PCP: Carollee Herter, MD  Subjective:   HPI: Patient is a 62 y.o. male here for left leg pain.  Patient reports he is very active - currently training for [employer]. Just recovered from soleus strain. Backed off exercises for this because he no longer had problems. Then about 1 week ago he did a series of 3 days of hard runs capped by a 17 mile run. Had some pain first day but severe by the third day. Has tried roller, motrin, restarted toe walks and heel walks. Pain seems to be between calf and the achilles. No acute pop. No bruising but had some swelling.  Past Medical History  Diagnosis Date  . Dyslipidemia   . ED (erectile dysfunction)   . Hyperlipidemia     Current Outpatient Prescriptions on File Prior to Visit  Medication Sig Dispense Refill  . aspirin 81 MG tablet Take 81 mg by mouth daily.        Marland Kitchen atorvastatin (LIPITOR) 20 MG tablet TAKE 1 TABLET DAILY  90 tablet  3  . FIBER PO Take by mouth daily.      . fish oil-omega-3 fatty acids 1000 MG capsule Take 2 g by mouth daily.         No current facility-administered medications on file prior to visit.    Past Surgical History  Procedure Laterality Date  . Colonoscopy      x2  . Inguinal hernia repair  06/16/2012    Procedure: HERNIA REPAIR INGUINAL ADULT;  Surgeon: Valarie Merino, MD;  Location: [facility];  Service: General;  Laterality: Left;  left inguinal hernia repair with mesh  . Hernia repair  06/16/12    [facility]    No Known Allergies  History   Social History  . Marital Status: Married    Spouse Name: N/A    Number of Children: N/A  . Years of Education: N/A   Occupational History  . Not on file.   Social History Main Topics  . Smoking status: Never Smoker   . Smokeless tobacco: Not on file  . Alcohol Use: Yes     Comment: wine of weekends  . Drug Use: No  . Sexual Activity: Yes   Other Topics  Concern  . Not on file   Social History Narrative  . No narrative on file    Family History  Problem Relation Age of Onset  . Heart disease Father   . Diabetes Neg Hx   . Hypertension Neg Hx     BP 112/68  Pulse 57  Ht 5\' 8"  (1.727 m)  Wt 137 lb (62.143 kg)  BMI 20.84 kg/m2  Review of Systems: See HPI above.    Objective:  Physical Exam:  Gen: NAD  Left lower leg: No gross deformity, swelling, ecchymoses FROM ankle with pain on full passive dorsiflexion. TTP at musculotendinous junction of achilles. Negative ant drawer and talar tilt.   Negative syndesmotic compression. Thompsons test negative. Painful to do calf raise. NV intact distally.    Assessment & Plan:  1. Left achilles strain - at musculotendinous junction.  Discussed treatment - tylenol/nsaids, calf raise exercise (restart this), icing, heel lifts.  He has some nitro left over from prior injury - to use this for achilles (risks of headaches, skin irritation, and advised no cialis while using this).  Cross train for next 1-2 weeks then discussed return  to running.  Consider PT, orthotics if not improving.  F/u in 6 weeks.

## 2013-09-09 NOTE — Telephone Encounter (Signed)
Sent. thanks

## 2013-10-20 ENCOUNTER — Ambulatory Visit: Payer: BC Managed Care – PPO | Admitting: Family Medicine

## 2013-10-28 ENCOUNTER — Ambulatory Visit: Payer: BC Managed Care – PPO | Admitting: Family Medicine

## 2013-11-05 ENCOUNTER — Encounter: Payer: Self-pay | Admitting: Sports Medicine

## 2013-11-05 ENCOUNTER — Ambulatory Visit (INDEPENDENT_AMBULATORY_CARE_PROVIDER_SITE_OTHER): Payer: BC Managed Care – PPO | Admitting: Sports Medicine

## 2013-11-05 VITALS — BP 123/76 | HR 48 | Ht 68.0 in | Wt 137.0 lb

## 2013-11-05 DIAGNOSIS — IMO0002 Reserved for concepts with insufficient information to code with codable children: Secondary | ICD-10-CM

## 2013-11-05 DIAGNOSIS — S76319A Strain of muscle, fascia and tendon of the posterior muscle group at thigh level, unspecified thigh, initial encounter: Secondary | ICD-10-CM

## 2013-11-05 NOTE — Progress Notes (Signed)
   Subjective:    Patient ID: Edwin Jimenez, male    DOB: [birth year], 63 y.o.   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  He first noticed the pain developing while running last Friday; it came on gradually until he finally felt a "twinge" in his distal hamstring.  No "pop" or swelling.  He localizes the pain to the posterolateral knee with radiation up the hamstring.  He has strained his left hamstring before, and believes that this is what he has done again.  He is currently training for the [facility] in April, and he is concerned that he will need to pull out of the race in order to recover.  He would like to continue training if possible.   Review of Systems All ROS are otherwise negative or stable except as indicated in the HPI.    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  U/S imaging supports this without evidence of a tear.  Patient has used nitroglycerin recently for a left achilles tendonopathy and would be amenable to utilizing this same strategy for his hamstrings.  May also benefit short- and long-term from Askling Protocol.  Plan: - Askling Protocol for hamstring strengthening and rehabilitation - Cont. Training as tolerated - NG patch, one per day, in area of tenderness; replace daily in a different position in order to avoid  dermatitis. Continue until asymptomatic. - Follow-up as needed  Note written by Bevelyn Buckles, MS4

## 2013-11-08 ENCOUNTER — Ambulatory Visit (INDEPENDENT_AMBULATORY_CARE_PROVIDER_SITE_OTHER): Payer: BC Managed Care – PPO | Admitting: Family Medicine

## 2013-11-08 VITALS — BP 122/82 | HR 52 | Wt 149.0 lb

## 2013-11-08 DIAGNOSIS — IMO0002 Reserved for concepts with insufficient information to code with codable children: Secondary | ICD-10-CM

## 2013-11-08 DIAGNOSIS — H109 Unspecified conjunctivitis: Secondary | ICD-10-CM

## 2013-11-08 DIAGNOSIS — R209 Unspecified disturbances of skin sensation: Secondary | ICD-10-CM

## 2013-11-08 DIAGNOSIS — R201 Hypoesthesia of skin: Secondary | ICD-10-CM

## 2013-11-08 NOTE — Progress Notes (Signed)
   Subjective:    Patient ID: Edwin Jimenez, male    DOB: 01-08-1951, 63 y.o.   MRN: [id number]  HPI He is here for multiple issues. He was recently seen and is presently being treated for left hamstring strain. He was told to take it easy however he did go out for a run which caused some difficulty with discomfort in the same area. He states that he does have difficulty with walking. He has had difficulty with a tingling sensation in his hands, left greater than right. He cannot relate this to wrist or elbow position. He is noted no weakness numbness in his arms or legs. He's had no visual changes. He is also noted some slight tingling in his toes again no other related symptoms.   Review of Systems     Objective:   Physical Exam Alert and in no distress. Both conjunctiva are slightly injected with left greater than right and a small medial hemorrhage is noted on the left. Or drainage is noted from the left eye. Normal motor, sensory and DTRs of the arms and legs. Tinel and Phalen test negative. Exam of his left posterior thigh shows no tenderness or palpable lesions.       Assessment & Plan:  Tactile hypesthesia  Conjunctivitis  Sprain and strain of other specified sites of hip and thigh  discuss the tingling sensation and recommend he keep track of his symptoms in terms of what, when, where and why to see if we can get her better feel for this. Discussed the possibility of MS although I do not think this is the case. Since he offered no symptoms in regard to his eye disease I will not pursue this. Discussed treatment of his hamstring strain. Recommended that since he is having trouble with walking, first he needs to be able to walk without discomfort before he can start a job or increase his physical activities. Followup here when he gets more formation concerning his tingling sensation.

## 2013-11-08 NOTE — Patient Instructions (Signed)
Pay attention to what, when, where and why in regard to your tingling sensation. Also be concerned about wrist and elbow position

## 2013-12-06 ENCOUNTER — Ambulatory Visit (INDEPENDENT_AMBULATORY_CARE_PROVIDER_SITE_OTHER): Payer: BC Managed Care – PPO | Admitting: Family Medicine

## 2013-12-06 ENCOUNTER — Encounter: Payer: Self-pay | Admitting: Family Medicine

## 2013-12-06 VITALS — BP 108/70 | HR 60 | Wt 137.0 lb

## 2013-12-06 DIAGNOSIS — D179 Benign lipomatous neoplasm, unspecified: Secondary | ICD-10-CM

## 2013-12-06 NOTE — Progress Notes (Signed)
   Subjective:    Patient ID: Edwin Jimenez, male    DOB: [birth year], 63 y.o.   MRN: [id number]  HPI He is here for evaluation of a lesion in the left buttock. He has a previous history of multiple lipomas that have been evaluated in the past.   Review of Systems     Objective:   Physical Exam Exam of the left buttock does show a round smooth movable 1.5 x 2 cm lesion. He also has multiple lipomas present on both arms and a few on his abdomen.       Assessment & Plan:  Lipoma  I reassured him that none of these are of any concern however if he feels any lumps that he is unsure of, he is to return here for evaluation.

## 2014-02-27 ENCOUNTER — Encounter (HOSPITAL_COMMUNITY): Payer: Self-pay | Admitting: Emergency Medicine

## 2014-02-27 ENCOUNTER — Emergency Department (INDEPENDENT_AMBULATORY_CARE_PROVIDER_SITE_OTHER)
Admission: EM | Admit: 2014-02-27 | Discharge: 2014-02-27 | Disposition: A | Discharge status: Home / Self Care | Payer: BC Managed Care – PPO | Source: Home / Self Care | Attending: Emergency Medicine | Admitting: Emergency Medicine

## 2014-02-27 DIAGNOSIS — L678 Other hair color and hair shaft abnormalities: Secondary | ICD-10-CM

## 2014-02-27 DIAGNOSIS — L739 Follicular disorder, unspecified: Secondary | ICD-10-CM

## 2014-02-27 DIAGNOSIS — L738 Other specified follicular disorders: Secondary | ICD-10-CM

## 2014-02-27 MED ORDER — HYDROXYZINE HCL 25 MG PO TABS: 25.0000 mg | ORAL_TABLET | Freq: Three times a day (TID) | ORAL | Status: DC | PRN

## 2014-02-27 MED ORDER — DOXYCYCLINE HYCLATE 100 MG PO CAPS: 100.0000 mg | ORAL_CAPSULE | Freq: Two times a day (BID) | ORAL | Status: DC

## 2014-02-27 NOTE — Discharge Instructions (Signed)
Folliculitis  Folliculitis is redness, soreness, and swelling (inflammation) of the hair follicles. This condition can occur anywhere on the body. People with weakened immune systems, diabetes, or obesity have a greater risk of getting folliculitis. CAUSES  Bacterial infection. This is the most common cause.  Fungal infection.  Viral infection.  Contact with certain chemicals, especially oils and tars. Long-term folliculitis can result from bacteria that live in the nostrils. The bacteria may trigger multiple outbreaks of folliculitis over time. SYMPTOMS Folliculitis most commonly occurs on the scalp, thighs, legs, back, buttocks, and areas where hair is shaved frequently. An early sign of folliculitis is a small, white or yellow, pus-filled, itchy lesion (pustule). These lesions appear on a red, inflamed follicle. They are usually less than 0.2 inches (5 mm) wide. When there is an infection of the follicle that goes deeper, it becomes a boil or furuncle. A group of closely packed boils creates a larger lesion (carbuncle). Carbuncles tend to occur in hairy, sweaty areas of the body. DIAGNOSIS  Your caregiver can usually tell what is wrong by doing a physical exam. A sample may be taken from one of the lesions and tested in a lab. This can help determine what is causing your folliculitis. TREATMENT  Treatment may include:  Applying warm compresses to the affected areas.  Taking antibiotic medicines orally or applying them to the skin.  Draining the lesions if they contain a large amount of pus or fluid.  Laser hair removal for cases of long-lasting folliculitis. This helps to prevent regrowth of the hair. HOME CARE INSTRUCTIONS  Apply warm compresses to the affected areas as directed by your caregiver.  If antibiotics are prescribed, take them as directed. Finish them even if you start to feel better.  You may take over-the-counter medicines to relieve itching.  Do not shave  irritated skin.  Follow up with your caregiver as directed. SEEK IMMEDIATE MEDICAL CARE IF:   You have increasing redness, swelling, or pain in the affected area.  You have a fever. MAKE SURE YOU:  Understand these instructions.  Will watch your condition.  Will get help right away if you are not doing well or get worse. Document Released: 12/02/2001 Document Revised: 03/24/2012 Document Reviewed: 12/24/2011 ExitCare Patient Information 2014 [employer].  

## 2014-02-27 NOTE — ED Provider Notes (Signed)
Medical screening examination/treatment/procedure(s) were performed by non-physician practitioner and as supervising physician I was immediately available for consultation/collaboration.  Philipp Deputy, M.D.  Harden Mo, MD 02/27/14 2227

## 2014-02-27 NOTE — ED Notes (Signed)
Patient has a rash that started on left thigh, spread to right thigh, right ear lob and face.  Patient has had minimal itching.  Patient noted rash on Friday.  Patient using steroid cream.  Denies any new activities, no new soap.  Patient denies feeling bad or any illnes.  Patient does feel tired.

## 2014-02-27 NOTE — ED Provider Notes (Signed)
CSN: [id number]     Arrival date & time 02/27/14  1812 History   First MD Initiated Contact with Patient 02/27/14 1853     Chief Complaint  Patient presents with  . Rash   (Consider location/radiation/quality/duration/timing/severity/associated sxs/prior Treatment) HPI Comments: Patient states he went to a concert on [phone] and noticed that when he arrived home he had a slightly pruritic rash at his left anterior thigh. About 24 hours later, he developed a similar rash at left anterior thigh and at right earlobe and at angle of mandible on right.  Denies fever/chills/malaise.  Denies previous episodes.   Patient is a 63 y.o. male presenting with rash. The history is provided by the patient.  Rash   Past Medical History  Diagnosis Date  . Dyslipidemia   . ED (erectile dysfunction)   . Hyperlipidemia    Past Surgical History  Procedure Laterality Date  . Colonoscopy      x2  . Inguinal hernia repair  06/16/2012    Procedure: HERNIA REPAIR INGUINAL ADULT;  Surgeon: Pedro Earls, MD;  Location: [facility];  Service: General;  Laterality: Left;  left inguinal hernia repair with mesh  . Hernia repair  06/16/12    [facility]   Family History  Problem Relation Age of Onset  . Heart disease Father   . Diabetes Neg Hx   . Hypertension Neg Hx    History  Substance Use Topics  . Smoking status: Never Smoker   . Smokeless tobacco: Not on file  . Alcohol Use: Yes     Comment: wine of weekends    Review of Systems  Skin: Positive for rash.  All other systems reviewed and are negative.   Allergies  Review of patient's allergies indicates no known allergies.  Home Medications   Prior to Admission medications   Medication Sig Start Date End Date Taking? Authorizing Provider  aspirin 81 MG tablet Take 81 mg by mouth daily.      Historical Provider, MD  atorvastatin (LIPITOR) 20 MG tablet TAKE 1 TABLET DAILY 03/10/13   Denita Lung, MD  doxycycline (VIBRAMYCIN) 100  MG capsule Take 1 capsule (100 mg total) by mouth 2 (two) times daily. X 7 days 02/27/14   Annett Gula Emberley Kral, PA  FIBER PO Take by mouth daily.    Historical Provider, MD  fish oil-omega-3 fatty acids 1000 MG capsule Take 2 g by mouth daily.      Historical Provider, MD  hydrOXYzine (ATARAX/VISTARIL) 25 MG tablet Take 1 tablet (25 mg total) by mouth 3 (three) times daily as needed for itching. 02/27/14   Annett Gula Samirah Scarpati, PA   BP 123/74  Pulse 55  Temp(Src) 98.5 F (36.9 C) (Oral)  Resp 18  SpO2 99% Physical Exam  Nursing note and vitals reviewed. Constitutional: He is oriented to person, place, and time. He appears well-developed and well-nourished. No distress.  HENT:  Head: Normocephalic and atraumatic.  Eyes: Conjunctivae are normal. No scleral icterus.  Cardiovascular: Normal rate.   Pulmonary/Chest: Effort normal.  Musculoskeletal: Normal range of motion.  Neurological: He is alert and oriented to person, place, and time.  Skin: Skin is warm and dry.  Large patch of discrete non-blanching small (1-3 mm) erythematous macule lesions at left anterior thigh. Lesions seem to be associated with hair follicles. Similar rash at right anterior thigh but lesions blanch with palpation. At right ear lobe, few very small scattered petechial lesions at anterior ear lobe.   Psychiatric:  He has a normal mood and affect. His behavior is normal.    ED Course  Procedures (including critical care time) Labs Review Labs Reviewed - No data to display  Imaging Review No results found.   MDM   1. Folliculitis   Patient examined with Dr. Jake Michaelis. Lower extremity rash seems to resemble folliculitis while the rash at right ear lobe and angle of right mandible appear different and without obvious cause/origin. Patient states he already has appointment scheduled with his dermatologist (Dr. Nevada Crane) in about 10 days. Will treat for folliculitis with hydroxyzine and doxycycline and advise dermatology  follow up if no improvement.   [place], [place] 02/27/14 [phone]

## 2014-03-07 ENCOUNTER — Telehealth: Payer: Self-pay | Admitting: Family Medicine

## 2014-03-07 DIAGNOSIS — E785 Hyperlipidemia, unspecified: Secondary | ICD-10-CM

## 2014-03-07 MED ORDER — ATORVASTATIN CALCIUM 20 MG PO TABS: ORAL_TABLET | ORAL | Status: DC

## 2014-03-07 NOTE — Telephone Encounter (Signed)
Medication refilled

## 2014-05-19 ENCOUNTER — Ambulatory Visit (INDEPENDENT_AMBULATORY_CARE_PROVIDER_SITE_OTHER): Payer: BC Managed Care – PPO | Admitting: Family Medicine

## 2014-05-19 ENCOUNTER — Encounter: Payer: Self-pay | Admitting: Family Medicine

## 2014-05-19 VITALS — BP 110/60 | HR 58 | Ht 67.5 in | Wt 134.0 lb

## 2014-05-19 DIAGNOSIS — Z0000 Encounter for general adult medical examination without abnormal findings: Secondary | ICD-10-CM

## 2014-05-19 DIAGNOSIS — Z79899 Other long term (current) drug therapy: Secondary | ICD-10-CM

## 2014-05-19 DIAGNOSIS — Z8249 Family history of ischemic heart disease and other diseases of the circulatory system: Secondary | ICD-10-CM

## 2014-05-19 DIAGNOSIS — E785 Hyperlipidemia, unspecified: Secondary | ICD-10-CM

## 2014-05-19 DIAGNOSIS — N529 Male erectile dysfunction, unspecified: Secondary | ICD-10-CM

## 2014-05-19 DIAGNOSIS — Z23 Encounter for immunization: Secondary | ICD-10-CM

## 2014-05-19 DIAGNOSIS — L719 Rosacea, unspecified: Secondary | ICD-10-CM

## 2014-05-19 DIAGNOSIS — D179 Benign lipomatous neoplasm, unspecified: Secondary | ICD-10-CM

## 2014-05-19 LAB — CBC WITH DIFFERENTIAL/PLATELET
Basophils Absolute: 0 10*3/uL (ref 0.0–0.1)
Basophils Relative: 1 % (ref 0–1)
Eosinophils Absolute: 0.1 10*3/uL (ref 0.0–0.7)
Eosinophils Relative: 2 % (ref 0–5)
HCT: 45.4 % (ref 39.0–52.0)
Hemoglobin: 15.8 g/dL (ref 13.0–17.0)
Lymphocytes Relative: 38 % (ref 12–46)
Lymphs Abs: 1.6 10*3/uL (ref 0.7–4.0)
MCH: 32.6 pg (ref 26.0–34.0)
MCHC: 34.8 g/dL (ref 30.0–36.0)
MCV: 93.8 fL (ref 78.0–100.0)
Monocytes Absolute: 0.5 10*3/uL (ref 0.1–1.0)
Monocytes Relative: 11 % (ref 3–12)
Neutro Abs: 2.1 10*3/uL (ref 1.7–7.7)
Neutrophils Relative %: 48 % (ref 43–77)
Platelets: 182 10*3/uL (ref 150–400)
RBC: 4.84 MIL/uL (ref 4.22–5.81)
RDW: 13.4 % (ref 11.5–15.5)
WBC: 4.3 10*3/uL (ref 4.0–10.5)

## 2014-05-19 MED ORDER — ATORVASTATIN CALCIUM 20 MG PO TABS: ORAL_TABLET | ORAL | Status: DC

## 2014-05-19 NOTE — Progress Notes (Signed)
   Subjective:    Patient ID: Edwin Jimenez, male    DOB: [birth year], 63 y.o.   MRN: [id number]  HPI He is here for complete examination. He does want me to look at several lesions. He has had a history of lipomas. He thinks he has a few new ones. He does occasionally have difficulty with orthostatic hypotension and has concerns about this. His work is quite stressful especially since he has been asked to move and is not willing to move to [place]. He continues to run regularly. He does have rosacea and is doing well with the medication. He continues on Lipitor and omega-3. He does occasionally have difficulty with erectile dysfunction but is able to perform sexually fairly well. Family and social history were viewed.   Review of Systems  All other systems reviewed and are negative.      Objective:   Physical Exam BP 110/60  Pulse 58  Ht 5' 7.5" (1.715 m)  Wt 134 lb (60.782 kg)  BMI 20.67 kg/m2  General Appearance:    Alert, cooperative, no distress, appears stated age  Head:    Normocephalic, without obvious abnormality, atraumatic  Eyes:    PERRL, conjunctiva/corneas clear, EOM's intact, fundi    benign  Ears:    Normal TM's and external ear canals  Nose:   Nares normal, mucosa normal, no drainage or sinus   tenderness  Throat:   Lips, mucosa, and tongue normal; teeth and gums normal  Neck:   Supple, no lymph adenopathy;  thyroid:  no   enlargement/tenderness/nodules; no carotid   bruit or JVD  Back:    Spine nontender, no curvature, ROM normal, no CVA     tenderness  Lungs:     Clear to auscultation bilaterally without wheezes, rales or     ronchi; respirations unlabored  Chest Wall:    No tenderness or deformity   Heart:    Regular rate and rhythm, S1 and S2 normal, no murmur, rub   or gallop  Breast Exam:    No chest wall tenderness, masses or gynecomastia  Abdomen:     Soft, non-tender, nondistended, normoactive bowel sounds,    no masses, no hepatosplenomegaly       Extremities:   No clubbing, cyanosis or edema  Pulses:   2+ and symmetric all extremities  Skin:   Skin color, texture, turgor normal, no rashes , multiple lipomas are noted on the arms   Lymph nodes:   Cervical, supraclavicular, and axillary nodes normal  Neurologic:   CNII-XII intact, normal strength, sensation and gait; reflexes 2+ and symmetric throughout          Psych:   Normal mood, affect, hygiene and grooming.          Assessment & Plan:  Family history of ischemic heart disease (IHD) - Plan: CBC with Differential, Comprehensive metabolic panel, Lipid panel  Hyperlipidemia with target LDL less than 70 - Plan: atorvastatin (LIPITOR) 20 MG tablet, Lipid panel  Erectile dysfunction, unspecified erectile dysfunction type  Rosacea  Encounter for long-term (current) use of other medications - Plan: CBC with Differential, Comprehensive metabolic panel, Lipid panel  Routine general medical examination at a health care facility - Plan: CBC with Differential, Comprehensive metabolic panel, Lipid panel  Multiple lipomas  encouraged him to continue with his very active lifestyle especially with running. Discussed the use of ED medication and at this point he will hold off. He will be given a flu shot.

## 2014-05-20 LAB — COMPREHENSIVE METABOLIC PANEL
ALT: 65 U/L — ABNORMAL HIGH (ref 0–53)
AST: 46 U/L — ABNORMAL HIGH (ref 0–37)
Albumin: 4.3 g/dL (ref 3.5–5.2)
Alkaline Phosphatase: 62 U/L (ref 39–117)
BUN: 21 mg/dL (ref 6–23)
CO2: 26 mEq/L (ref 19–32)
Calcium: 9.1 mg/dL (ref 8.4–10.5)
Chloride: 104 mEq/L (ref 96–112)
Creat: 0.84 mg/dL (ref 0.50–1.35)
Glucose, Bld: 82 mg/dL (ref 70–99)
Potassium: 3.9 mEq/L (ref 3.5–5.3)
Sodium: 139 mEq/L (ref 135–145)
Total Bilirubin: 0.9 mg/dL (ref 0.2–1.2)
Total Protein: 6.5 g/dL (ref 6.0–8.3)

## 2014-05-20 LAB — LIPID PANEL
Cholesterol: 123 mg/dL (ref 0–200)
HDL: 51 mg/dL (ref >39)
LDL Cholesterol: 63 mg/dL (ref 0–99)
Total CHOL/HDL Ratio: 2.4 Ratio
Triglycerides: 46 mg/dL (ref <150)
VLDL: 9 mg/dL (ref 0–40)

## 2014-05-24 NOTE — Addendum Note (Signed)
Addended by: Randel Books on: 05/24/2014 09:06 AM   Modules accepted: Orders

## 2014-08-10 ENCOUNTER — Ambulatory Visit (INDEPENDENT_AMBULATORY_CARE_PROVIDER_SITE_OTHER): Payer: BC Managed Care – PPO | Admitting: Family Medicine

## 2014-08-10 ENCOUNTER — Encounter: Payer: Self-pay | Admitting: Family Medicine

## 2014-08-10 VITALS — BP 110/70 | HR 57 | Wt 138.0 lb

## 2014-08-10 DIAGNOSIS — H6122 Impacted cerumen, left ear: Secondary | ICD-10-CM

## 2014-08-10 NOTE — Progress Notes (Signed)
   Subjective:    Patient ID: Edwin Jimenez, male    DOB: 1951-08-06, 63 y.o.   MRN: [id number]  HPI He is here complaining of difficulty with hearing from the left ear. No pain, sore throat or cough congestion.   Review of Systems     Objective:   Physical Exam Alert and in no distress. Right TM and canal normal. Left TM and canal normal after cerumen was removed from the canal with manual and lavage.       Assessment & Plan:  Cerumen impaction, left return here as needed.

## 2014-09-26 ENCOUNTER — Other Ambulatory Visit: Payer: Self-pay | Admitting: Family Medicine

## 2014-09-26 ENCOUNTER — Telehealth: Payer: Self-pay | Admitting: Family Medicine

## 2014-09-26 ENCOUNTER — Ambulatory Visit (INDEPENDENT_AMBULATORY_CARE_PROVIDER_SITE_OTHER): Payer: BC Managed Care – PPO | Admitting: Family Medicine

## 2014-09-26 ENCOUNTER — Encounter: Payer: Self-pay | Admitting: Family Medicine

## 2014-09-26 VITALS — BP 114/72 | HR 64 | Wt 139.0 lb

## 2014-09-26 DIAGNOSIS — N644 Mastodynia: Secondary | ICD-10-CM

## 2014-09-26 NOTE — Progress Notes (Signed)
   Subjective:    Patient ID: Edwin Jimenez, male    DOB: 1950-10-08, 63 y.o.   MRN: [id number]  HPI He is here for evaluation of left breast pain. Has been intermittent in nature. In the past he has had 2 mammograms done but cannot remember whether it is the same lesion. He was told that he had approximately 11 cystic lesions in his breast. He is on no new medications. He does not use street drugs.   Review of Systems     Objective:   Physical Exam Alert and in no distress. Multiple lipomas are noted especially on his arms. He also has a 1-1/2 cm round slightly tender easily movable lesion in the left breast at the 2:00 position. Right breast is normal. Abdominal exam shows no hepatosplenomegaly       Assessment & Plan:  Breast pain, left - Plan: MM Digital Diagnostic Bilat

## 2014-09-26 NOTE — Telephone Encounter (Signed)
Phoned pt cell and advised him of mammography on 10-10-2014 at [facility] [phone].  Will send previous mammo reports

## 2014-10-10 ENCOUNTER — Ambulatory Visit
Admission: RE | Admit: 2014-10-10 | Discharge: 2014-10-10 | Disposition: A | Discharge status: Home / Self Care | Payer: BC Managed Care – PPO | Source: Ambulatory Visit | Attending: Family Medicine | Admitting: Family Medicine

## 2014-10-10 DIAGNOSIS — N644 Mastodynia: Secondary | ICD-10-CM

## 2014-10-18 ENCOUNTER — Encounter: Payer: Self-pay | Admitting: Sports Medicine

## 2014-10-18 ENCOUNTER — Ambulatory Visit (INDEPENDENT_AMBULATORY_CARE_PROVIDER_SITE_OTHER): Payer: BLUE CROSS/BLUE SHIELD | Admitting: Sports Medicine

## 2014-10-18 VITALS — BP 116/77 | HR 50 | Ht 68.0 in | Wt 138.0 lb

## 2014-10-18 DIAGNOSIS — M79661 Pain in right lower leg: Secondary | ICD-10-CM | POA: Diagnosis not present

## 2014-10-18 DIAGNOSIS — M25561 Pain in right knee: Secondary | ICD-10-CM | POA: Diagnosis not present

## 2014-10-18 MED ORDER — DICLOFENAC SODIUM 75 MG PO TBEC
DELAYED_RELEASE_TABLET | ORAL | Status: DC
Start: 2014-10-18 — End: 2015-05-25

## 2014-10-18 NOTE — Progress Notes (Signed)
   Subjective:    Patient ID: Edwin Jimenez, male    DOB: [birth year], 64 y.o.   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 Of note, he had a hamstring injury about a month prior to his shin pain that resolved with rest and progressive rehabilitation. His shin pain began as a soreness last week once he finished running 14 miles. The pain persisted despite resting for several days. He attempted running 3 days ago and could only run 1 mile before having to turn around due to pain. He tried icing his shin and taking ibuprofen without much relief. He has pain constantly including walking, running and driving (pressing on gas pedal). Running makes his pain excruciating. He has been biking for the past week and occasionally running 10 minutes on the elliptical. He denies any previous shin/ankle injuries or trauma.     Review of Systems     Objective:   Physical Exam  Well nourished. Well developed. No acute distress.  BP 116/77 mmHg  Pulse 50  Ht 5\' 8"  (1.727 m)  Wt 138 lb (62.596 kg)  BMI 20.99 kg/m2  Right lower leg: Patient is tender to palpation along the anterior lateral lower leg in the area of the anterior tibialis tendon and extensor digitorum muscle belly. Mild soft tissue swelling in this area as well. No tenderness to palpation or percussion directly over the tibia. No tenderness to palpation along the tibial border. There is reproducible pain with active dorsiflexion of the right ankle and passive plantar flexion. Good dorsalis and posterior tibial pulses. Neurovascularly intact distally. Walking with an obvious limp.  Left Lower Leg: No swelling or bruising noted. Leg non-tender to palpation. Foot plantar and dorsal flexion ROM intact.  5/5 strength with plantar flexion and dorsal flexion.   Feet: No splaying of toes. Well maintained arch.   Normal gait.   Ultrasound: Hypoechoic fluid  accumulated within belly of tibialis anterior muscle. No obvious cortical irregularity of the tibia to suggest stress fracture.        Assessment & Plan:   Note written originally by Jolinda Croak, MS4 [facility]. Note edited/reviewed by Dr. Lilia Argue.

## 2014-10-18 NOTE — Patient Instructions (Signed)
Anterior tibialis tendon strain on right leg Wear Cam walker boot and call back with results in a week. Take Voltaren as directed for a week.

## 2014-10-18 NOTE — Assessment & Plan Note (Addendum)
Differential includes injury to Tibialis Anterior muscle vs. Tibial stress fracture. Ultrasound and exam more consistent with tibialis anterior muscle injury.   Plan:  1) Calf compression body helix to use during activity  2) Short cam walking boot for comfort.  3) Recommended icing for 10 - 15 minutes 1 - 3x daily.  4) Diclofenac 75 mg BID for 7 days 5) Patient will call next week and update with progress. If no improvement, will pursue consider further diagnostic imaging

## 2014-10-24 ENCOUNTER — Telehealth: Payer: Self-pay | Admitting: Sports Medicine

## 2014-10-24 NOTE — Telephone Encounter (Signed)
-----   Message from Carolyne Littles sent at 10/24/2014  3:38 PM EST ----- Pt states you asked him to call you today.

## 2014-10-24 NOTE — Telephone Encounter (Signed)
I spoke with the patient on the phone today. I asked him to call me with a progress update in regards to his anterior tibialis tendinitis. Overall he is feeling much better. Cam Gilford Rile has made a big difference. He has started to wean from the [facility] and is taking diclofenac only once a day. He has been able to bike and swim without any pain. I recommended that he take the remainder of this week to wean completely out of the [place] and continue crosstraining but I recommended that he avoid any running for the time being. Instead, I would like for him to call me again at the end of this week with another progress report and if he is doing well I will consider allowing him to return to running next week.

## 2014-11-01 ENCOUNTER — Encounter: Payer: Self-pay | Admitting: Sports Medicine

## 2014-11-09 ENCOUNTER — Other Ambulatory Visit: Payer: Self-pay | Admitting: *Deleted

## 2014-11-09 DIAGNOSIS — M79661 Pain in right lower leg: Secondary | ICD-10-CM

## 2014-11-09 NOTE — Telephone Encounter (Signed)
Patient called today to say he is experiencing returning right lower leg pain. He has been able to return to some running but his pain has returned. I would like to send him for x-rays of his right lower leg. If unremarkable we will proceed with an MRI to rule out a tibial stress fracture. I will follow-up with him via telephone with these results once available at which point we will delineate further treatment.

## 2014-11-10 ENCOUNTER — Encounter: Payer: Self-pay | Admitting: Sports Medicine

## 2014-11-21 ENCOUNTER — Other Ambulatory Visit: Payer: BLUE CROSS/BLUE SHIELD

## 2014-12-12 ENCOUNTER — Encounter: Payer: Self-pay | Admitting: Sports Medicine

## 2014-12-15 ENCOUNTER — Encounter: Payer: Self-pay | Admitting: Sports Medicine

## 2014-12-15 ENCOUNTER — Ambulatory Visit (INDEPENDENT_AMBULATORY_CARE_PROVIDER_SITE_OTHER): Payer: BLUE CROSS/BLUE SHIELD | Admitting: Sports Medicine

## 2014-12-15 VITALS — BP 102/74 | Ht 68.0 in | Wt 137.0 lb

## 2014-12-15 DIAGNOSIS — S76311A Strain of muscle, fascia and tendon of the posterior muscle group at thigh level, right thigh, initial encounter: Secondary | ICD-10-CM | POA: Diagnosis not present

## 2014-12-15 NOTE — Progress Notes (Signed)
  [employer] - 64 y.o. male MRN [id number]  Date of birth: 1950/12/04  SUBJECTIVE:  Including CC & ROS.  Patient is a 64 year old avid runner who presents today after developing a pull in his right hamstring while running over the weekend on Saturday. Patient reports he was approximately 8 miles into his run when he developed a stabbing pain into the mid aspect of his right hamstring. Following this he had to discontinue running and walking back to his vehicle. He reports limping all the way back. Since that time he has discontinued running for the last few days and has been treating with crosstraining, icing, and compression sleeve. Reports he was concern about his hamstring eventually being irritated over the last several weeks with intermittent pulling sensation but no definitive popping sensation. He started doing some preventative Askling hamstring exercises for only the last 2 weeks. Since his injury on Saturday developed some painful bruising over his mid right hamstring.   ROS: Review of systems otherwise negative except for information present in HPI  HISTORY: Past Medical, Surgical, Social, and Family History Reviewed & Updated per EMR. Pertinent Historical Findings include: Patient reports previous history of bilateral hamstring strains in the past within the last 23 years related to his running History of hyperlipidemia Patient's a nonsmoker  DATA REVIEWED: No other imaging available  PHYSICAL EXAM:  VS: BP:102/74 mmHg  HR: bpm  TEMP: ( )  RESP:   HT:5\' 8"  (172.7 cm)   WT:137 lb (62.143 kg)  BMI:20.9 Right hamstring EXAM:  General: well nourished Skin of LE: warm; dry, no rashes, lesions, ecchymosis or erythema. Vascular: Dorsal pedal and femoral pulses 2+ bilaterally Neurologically: Sensation to light touch lower extremities equal and intact bilaterally.    Observation - Patient has evidence of ecchymosis and mild swelling over the medial mid aspect of his right hamstring.  No edema, no hematoma Palpation:  Tenderness to palpation over the mid medial head of the hamstring tendon and muscle belly particular over area where skin ecchymosis is present. No tenderness over the hamstring tendinous insertion at the ishial tuberosity No tenderness at the distal medial or lateral insertion. ROM: Normal Hip motion in flexion, extension, internal and external rotation Muscle strength: 4+/5 strength and hamstring knee flexion in both neutral and internal and external rotation of the tibia. Positive H test for pain and mild fatigue  MSK [place]: Several small hypoechoic splits seen in the mid substance of the medial head of the hamstring. No complete tear and no tearing at the insertion.   ASSESSMENT & PLAN: See problem based charting & AVS for pt instructions. Impression: Right Hamstring muscle strain without rupture  Recommendations: - Decrease running mileage by 50% and may run as long as pain free -Start cross training with stationary bike and elliptical  -Start strengthening exercise hamstring protocol and Askling exercise  -F/u in 3 week to recheck and repeat US to evaluate strain for healing -Compression as tolerated

## 2014-12-16 ENCOUNTER — Encounter: Payer: Self-pay | Admitting: Sports Medicine

## 2014-12-27 ENCOUNTER — Ambulatory Visit (INDEPENDENT_AMBULATORY_CARE_PROVIDER_SITE_OTHER): Payer: BLUE CROSS/BLUE SHIELD | Admitting: Family Medicine

## 2014-12-27 ENCOUNTER — Encounter: Payer: Self-pay | Admitting: Family Medicine

## 2014-12-27 VITALS — BP 118/74 | HR 53 | Temp 97.7°F | Wt 138.0 lb

## 2014-12-27 DIAGNOSIS — L409 Psoriasis, unspecified: Secondary | ICD-10-CM

## 2014-12-27 MED ORDER — HALCINONIDE 0.1 % EX CREA: 1.0000 "application " | TOPICAL_CREAM | Freq: Two times a day (BID) | CUTANEOUS | Status: DC

## 2014-12-27 NOTE — Progress Notes (Signed)
   Subjective:    Patient ID: Edwin Jimenez, male    DOB: [birth year], 64 y.o.   MRN: [id number]  HPI He is here for evaluation of a 1 week history of a bilateral external ear condition consisting of redness, itching, burning and feeling hot. He reports this has been occuring intermittently for the past 2 years, mainly during the winter months and typically goes away after a couple of weeks. He has been using Desoximetasone topical without relief. Also applying lotion and Sarna cream as needed for itching.  He denies fever, chills, or inner ear issues. He has an appointment with Dr. Nevada Crane, Dermatologist, next week.    Review of Systems  All other systems reviewed and are negative.         Physical Exam  Alert and in no distress. Tympanic membranes are normal. External ears are erythematous only on the lower pole of the ears, left greater than right, and bilateral lobes are slightly edematous. Skin intact, without drainage. Throat is clear.       Assessment & Plan:  Psoriasis - Plan: Halcinonide (HALOG) 0.1 % CREA  Apply thin layer to ears as prescribed. This may be due to psoriasis but follow up as scheduled with Dr. Nevada Crane next week for further evaluation.

## 2015-01-10 ENCOUNTER — Encounter: Payer: Self-pay | Admitting: Sports Medicine

## 2015-01-10 ENCOUNTER — Ambulatory Visit (INDEPENDENT_AMBULATORY_CARE_PROVIDER_SITE_OTHER): Payer: BLUE CROSS/BLUE SHIELD | Admitting: Sports Medicine

## 2015-01-10 VITALS — BP 100/47 | Ht 68.0 in | Wt 137.0 lb

## 2015-01-10 DIAGNOSIS — S76311D Strain of muscle, fascia and tendon of the posterior muscle group at thigh level, right thigh, subsequent encounter: Secondary | ICD-10-CM

## 2015-01-10 NOTE — Progress Notes (Signed)
   Subjective:    Patient ID: Edwin Jimenez, male    DOB: 06/21/1951, 64 y.o.   MRN: [id number]  HPI   Patient comes in today for follow-up on a right hamstring injury. Overall he is improving. He is not quite 100% pain-free but he is definitely getting better. He has not yet resumed any running. He still notices some slight discoloration near the area of his injury. Prior ultrasound showed several small hypoechoic areas in the mid substance of the medial biceps femoris. He has been able to bike and use the elliptical without any pain. He is walking without pain. He has been compliant with his home exercise program.    Review of Systems     Objective:   Physical Exam  well-developed, well-nourished. No acute distress.  Right hamstring: There is slight tenderness to palpation at the mid substance of the medial biceps femoris. There is also very slight resolving ecchymosis in this area. No palpable defect. Good strength with resisted knee flexion at both 20 and 90. No pain with H testing. Neurovascularly intact distally. Walking without a limp.  MSK ultrasound of the right hamstring was performed. On the [place] there is still a small hypoechoic area in the mid substance of the medial biceps femoris. There is hyperechoic change within this area which suggests interval healing. No other abnormality seen.       Assessment & Plan:  Healing partial hamstring tear, right hamstring  Patient is improving. Although he still has ultrasound evidence of a small residual tear I think he is okay to continue increasing his activity based on symptoms. This includes resuming some light running using pain as his guide. He has a compression sleeve. He will continue with his home exercises. Follow-up with me in 3-4 weeks for reevaluation and repeat x-ray.

## 2015-01-31 ENCOUNTER — Encounter: Payer: Self-pay | Admitting: Sports Medicine

## 2015-01-31 ENCOUNTER — Ambulatory Visit (INDEPENDENT_AMBULATORY_CARE_PROVIDER_SITE_OTHER): Payer: BLUE CROSS/BLUE SHIELD | Admitting: Sports Medicine

## 2015-01-31 VITALS — BP 100/62 | Ht 68.0 in | Wt 137.0 lb

## 2015-01-31 DIAGNOSIS — S76311D Strain of muscle, fascia and tendon of the posterior muscle group at thigh level, right thigh, subsequent encounter: Secondary | ICD-10-CM

## 2015-01-31 NOTE — Progress Notes (Signed)
   Subjective:    Patient ID: Edwin Jimenez, male    DOB: 1951-03-30, 64 y.o.   MRN: [id number]  HPI   Patient comes in today for follow-up on his right hamstring injury. He is feeling better. He has been able to do some easy running without any returning pain. He has been compliant with his Askling exercises. He is here today for repeat ultrasound.   Review of Systems     Objective:   Physical Exam Well-developed, well-nourished. No acute distress  Right hamstring: Minimal tenderness to palpation. No defect. No soft tissue swelling. Prior ecchymosis has resolved. No pain with H testing. Neurovascularly intact distally. Walking without a limp.  MSK ultrasound of the right hamstring was performed. The previous area of hypoechoic change seen in the mid substance of the medial biceps femoris is no longer appreciated. This suggests interval healing of his previous small hamstring tear       Assessment & Plan:  Improving right hamstring tear  Patient is okay to increase his running. I have recommended against doing any speed work or sprinting for a few more weeks. I would like for him to continue with his compression sleeve with running as well. We will advance him to some Nordic hamstring exercises and we will have him do 1-2 sets 2-3 times a week. Follow-up for ongoing or recalcitrant issues.

## 2015-02-01 ENCOUNTER — Encounter: Payer: Self-pay | Admitting: Sports Medicine

## 2015-05-15 ENCOUNTER — Encounter: Payer: Self-pay | Admitting: Sports Medicine

## 2015-05-25 ENCOUNTER — Ambulatory Visit (INDEPENDENT_AMBULATORY_CARE_PROVIDER_SITE_OTHER): Payer: BLUE CROSS/BLUE SHIELD | Admitting: Family Medicine

## 2015-05-25 ENCOUNTER — Encounter: Payer: Self-pay | Admitting: Family Medicine

## 2015-05-25 DIAGNOSIS — S50311A Abrasion of right elbow, initial encounter: Secondary | ICD-10-CM | POA: Diagnosis not present

## 2015-05-25 NOTE — Patient Instructions (Signed)
Keep the area clean and use an antibiotic on that until its dry. Any questions about infection,call.

## 2015-05-25 NOTE — Progress Notes (Signed)
   Subjective:    Patient ID: Edwin Jimenez, male    DOB: [birth year], 64 y.o.   MRN: [id number]  HPI Earlier today while running he tripped over a curb and fell injuring his right elbow. He did clean it up at the site and came here for further evaluation.   Review of Systems     Objective:   Physical Exam Abrasions noted over the right elbow with one area through all layers of the skin.No bone tenderness       Assessment & Plan:  Elbow abrasion, right, initial encounter The wound was cleaned with soap and an antibody, and applied. He is to keep this area clean and dry and watch for signs of infection. His immunizations are up-to-date.

## 2015-06-01 ENCOUNTER — Other Ambulatory Visit: Payer: Self-pay | Admitting: Family Medicine

## 2015-08-01 ENCOUNTER — Ambulatory Visit (INDEPENDENT_AMBULATORY_CARE_PROVIDER_SITE_OTHER): Payer: BLUE CROSS/BLUE SHIELD | Admitting: Family Medicine

## 2015-08-01 ENCOUNTER — Encounter: Payer: Self-pay | Admitting: Family Medicine

## 2015-08-01 VITALS — BP 120/60 | HR 50 | Ht 68.0 in | Wt 135.0 lb

## 2015-08-01 DIAGNOSIS — D179 Benign lipomatous neoplasm, unspecified: Secondary | ICD-10-CM | POA: Diagnosis not present

## 2015-08-01 DIAGNOSIS — Z23 Encounter for immunization: Secondary | ICD-10-CM | POA: Diagnosis not present

## 2015-08-01 DIAGNOSIS — E785 Hyperlipidemia, unspecified: Secondary | ICD-10-CM | POA: Diagnosis not present

## 2015-08-01 DIAGNOSIS — Z1159 Encounter for screening for other viral diseases: Secondary | ICD-10-CM

## 2015-08-01 DIAGNOSIS — N5201 Erectile dysfunction due to arterial insufficiency: Secondary | ICD-10-CM

## 2015-08-01 DIAGNOSIS — Z0000 Encounter for general adult medical examination without abnormal findings: Secondary | ICD-10-CM

## 2015-08-01 DIAGNOSIS — S76311D Strain of muscle, fascia and tendon of the posterior muscle group at thigh level, right thigh, subsequent encounter: Secondary | ICD-10-CM

## 2015-08-01 DIAGNOSIS — Z8249 Family history of ischemic heart disease and other diseases of the circulatory system: Secondary | ICD-10-CM | POA: Diagnosis not present

## 2015-08-01 DIAGNOSIS — L719 Rosacea, unspecified: Secondary | ICD-10-CM | POA: Diagnosis not present

## 2015-08-01 LAB — COMPREHENSIVE METABOLIC PANEL
ALT: 61 U/L — ABNORMAL HIGH (ref 9–46)
AST: 51 U/L — ABNORMAL HIGH (ref 10–35)
Albumin: 4 g/dL (ref 3.6–5.1)
Alkaline Phosphatase: 72 U/L (ref 40–115)
BUN: 18 mg/dL (ref 7–25)
CO2: 29 mmol/L (ref 20–31)
Calcium: 9.3 mg/dL (ref 8.6–10.3)
Chloride: 104 mmol/L (ref 98–110)
Creat: 0.85 mg/dL (ref 0.70–1.25)
Glucose, Bld: 98 mg/dL (ref 65–99)
Potassium: 4.8 mmol/L (ref 3.5–5.3)
Sodium: 140 mmol/L (ref 135–146)
Total Bilirubin: 0.9 mg/dL (ref 0.2–1.2)
Total Protein: 6.7 g/dL (ref 6.1–8.1)

## 2015-08-01 LAB — POCT URINALYSIS DIPSTICK
Bilirubin, UA: NEGATIVE
Blood, UA: NEGATIVE
Glucose, UA: NEGATIVE
Ketones, UA: NEGATIVE
Leukocytes, UA: NEGATIVE
Nitrite, UA: NEGATIVE
Protein, UA: NEGATIVE
Spec Grav, UA: 1.015
Urobilinogen, UA: NEGATIVE
pH, UA: 6

## 2015-08-01 LAB — CBC WITH DIFFERENTIAL/PLATELET
Basophils Absolute: 0 10*3/uL (ref 0.0–0.1)
Basophils Relative: 1 % (ref 0–1)
Eosinophils Absolute: 0.1 10*3/uL (ref 0.0–0.7)
Eosinophils Relative: 2 % (ref 0–5)
HCT: 45.7 % (ref 39.0–52.0)
Hemoglobin: 15.7 g/dL (ref 13.0–17.0)
Lymphocytes Relative: 38 % (ref 12–46)
Lymphs Abs: 1.5 10*3/uL (ref 0.7–4.0)
MCH: 32.8 pg (ref 26.0–34.0)
MCHC: 34.4 g/dL (ref 30.0–36.0)
MCV: 95.6 fL (ref 78.0–100.0)
MPV: 10.5 fL (ref 8.6–12.4)
Monocytes Absolute: 0.4 10*3/uL (ref 0.1–1.0)
Monocytes Relative: 10 % (ref 3–12)
Neutro Abs: 1.9 10*3/uL (ref 1.7–7.7)
Neutrophils Relative %: 49 % (ref 43–77)
Platelets: 176 10*3/uL (ref 150–400)
RBC: 4.78 MIL/uL (ref 4.22–5.81)
RDW: 13 % (ref 11.5–15.5)
WBC: 3.9 10*3/uL — ABNORMAL LOW (ref 4.0–10.5)

## 2015-08-01 LAB — LIPID PANEL
Cholesterol: 138 mg/dL (ref 125–200)
HDL: 58 mg/dL (ref >=40)
LDL Cholesterol: 69 mg/dL (ref <130)
Total CHOL/HDL Ratio: 2.4 Ratio (ref <=5.0)
Triglycerides: 53 mg/dL (ref <150)
VLDL: 11 mg/dL (ref <30)

## 2015-08-01 LAB — HEPATITIS C ANTIBODY: HCV Ab: NEGATIVE

## 2015-08-01 NOTE — Progress Notes (Signed)
Subjective:    Patient ID: Edwin Jimenez, male    DOB: 1951-05-06, 64 y.o.   MRN: [id number]  HPI He is here for a complete examination. He has no particular concerns or complaints. He does have an underlying family history of heart disease. He runs regularly and has noted no change in his ability to do that. He continues on his Lipitor and again is having no myalgias or CNS symptoms. His rosacea is under good control. He does have multiple lipomas. He does have a mild case of ED but is able to achieve and maintain an erection as well as ejaculate. He recently ran a half marathon and did have some right hamstring symptoms but he is doing some stretching exercises which he states helps. His immunizations are up-to-date. Family and social history is unchanged although at this time he is looking for a job. He was working for [employer] which was bought out by [employer] however they had wanted him to move to [place] and he refused.   Review of Systems  All other systems reviewed and are negative.      Objective:   Physical Exam BP 120/60 mmHg  Pulse 50  Ht 5\' 8"  (1.727 m)  Wt 135 lb (61.236 kg)  BMI 20.53 kg/m2  SpO2 98%  General Appearance:    Alert, cooperative, no distress, appears stated age  Head:    Normocephalic, without obvious abnormality, atraumatic  Eyes:    PERRL, conjunctiva/corneas clear, EOM's intact, fundi    benign  Ears:    Normal TM's and external ear canals  Nose:   Nares normal, mucosa normal, no drainage or sinus   tenderness  Throat:   Lips, mucosa, and tongue normal; teeth and gums normal  Neck:   Supple, no lymphadenopathy;  thyroid:  no   enlargement/tenderness/nodules; no carotid   bruit or JVD  Back:    Spine nontender, no curvature, ROM normal, no CVA     tenderness  Lungs:     Clear to auscultation bilaterally without wheezes, rales or      ronchi; respirations unlabored  Chest Wall:    No tenderness or deformity   Heart:    Regular rate and rhythm,  S1 and S2 normal, no murmur, rub   or gallop  Breast Exam:    No chest wall tenderness, masses or gynecomastia  Abdomen:     Soft, non-tender, nondistended, normoactive bowel sounds,    no masses, no hepatosplenomegaly        Extremities:   No clubbing, cyanosis or edema  Pulses:   2+ and symmetric all extremities  Skin:   Skin color, texture, turgor normal, no rashes or lesions multiple lipomas noted  Lymph nodes:   Cervical, supraclavicular, and axillary nodes normal  Neurologic:   CNII-XII intact, normal strength, sensation and gait; reflexes 2+ and symmetric throughout          Psych:   Normal mood, affect, hygiene and grooming.     EKG shows no changes from previous tracing.     Assessment & Plan:  Routine general medical examination at a health care facility - Plan: POCT Urinalysis Dipstick, CBC with Differential/Platelet, Comprehensive metabolic panel, Lipid panel, EKG 12-Lead  Family history of ischemic heart disease (IHD) - Plan: EKG 12-Lead  Hyperlipidemia with target LDL less than 70 - Plan: Lipid panel  Erectile dysfunction due to arterial insufficiency  Multiple lipomas  Rosacea  Right hamstring muscle strain, subsequent encounter  Need for  prophylactic vaccination and inoculation against influenza - Plan: Flu Vaccine QUAD 36+ mos PF IM (Fluarix & Fluzone Quad PF)  Need for hepatitis C screening test - Plan: Hepatitis C Antibody encouraged him to continue with his very active lifestyle.

## 2015-11-02 ENCOUNTER — Ambulatory Visit: Payer: BLUE CROSS/BLUE SHIELD | Admitting: Sports Medicine

## 2016-02-12 ENCOUNTER — Other Ambulatory Visit: Payer: Self-pay

## 2016-02-12 ENCOUNTER — Telehealth: Payer: Self-pay | Admitting: Family Medicine

## 2016-02-12 MED ORDER — ATORVASTATIN CALCIUM 20 MG PO TABS: 20.0000 mg | ORAL_TABLET | Freq: Every day | ORAL | Status: DC

## 2016-02-12 NOTE — Telephone Encounter (Signed)
Rcvd 90 day refilll request for Atorvastatin 20 mg from [facility]

## 2016-02-12 NOTE — Telephone Encounter (Signed)
Sent in

## 2016-02-23 ENCOUNTER — Encounter: Payer: Self-pay | Admitting: Family Medicine

## 2016-02-23 ENCOUNTER — Ambulatory Visit (INDEPENDENT_AMBULATORY_CARE_PROVIDER_SITE_OTHER): Payer: BLUE CROSS/BLUE SHIELD | Admitting: Family Medicine

## 2016-02-23 VITALS — BP 114/78 | HR 70 | Ht 68.0 in | Wt 131.0 lb

## 2016-02-23 DIAGNOSIS — R292 Abnormal reflex: Secondary | ICD-10-CM

## 2016-02-23 DIAGNOSIS — R252 Cramp and spasm: Secondary | ICD-10-CM | POA: Diagnosis not present

## 2016-02-23 LAB — COMPREHENSIVE METABOLIC PANEL
ALT: 29 U/L (ref 9–46)
AST: 26 U/L (ref 10–35)
Albumin: 4.1 g/dL (ref 3.6–5.1)
Alkaline Phosphatase: 59 U/L (ref 40–115)
BUN: 17 mg/dL (ref 7–25)
CO2: 27 mmol/L (ref 20–31)
Calcium: 9.2 mg/dL (ref 8.6–10.3)
Chloride: 102 mmol/L (ref 98–110)
Creat: 0.96 mg/dL (ref 0.70–1.25)
Glucose, Bld: 88 mg/dL (ref 65–99)
Potassium: 4.2 mmol/L (ref 3.5–5.3)
Sodium: 139 mmol/L (ref 135–146)
Total Bilirubin: 0.7 mg/dL (ref 0.2–1.2)
Total Protein: 6.7 g/dL (ref 6.1–8.1)

## 2016-02-23 LAB — T3, FREE: T3, Free: 3.1 pg/mL (ref 2.3–4.2)

## 2016-02-23 LAB — TSH: TSH: 1.56 mIU/L (ref 0.40–4.50)

## 2016-02-23 LAB — T4, FREE: Free T4: 1.1 ng/dL (ref 0.8–1.8)

## 2016-02-23 NOTE — Progress Notes (Signed)
   Subjective:    Patient ID: Edwin Jimenez, male    DOB: 08-29-1951, 65 y.o.   MRN: [id number]  HPI  he complains of a one-month long history of difficulty with cramping in his feet and toes.  Bit does tend to bother him more at night. He has noted no skin or hair changes but has had a several pound weight loss. His eating habits and activity habits have been unchanged although he did recently come back from a trip to [place]   Review of Systems     Objective:   Physical Exam  alert and in no distress.  Neck is supple without adenopathy or thyromegaly. No exophthalmus is noted.Cardiac exam shows regular rhythm without murmurs or gallops. Lungs clear to auscultation. Skin feels normal. DTRs are 3+ bilaterally.       Assessment & Plan:  Cramp of both lower extremities - Plan: CBC with Differential/Platelet, Comprehensive metabolic panel, TSH, T3, Free, T4, Free  Hyperreflexia - Plan: CBC with Differential/Platelet, Comprehensive metabolic panel, TSH, T3, Free, T4, Free   Symptoms suggestive of thyroid but we'll also do general blood screening

## 2016-02-24 LAB — CBC WITH DIFFERENTIAL/PLATELET
Basophils Absolute: 47 cells/uL (ref 0–200)
Basophils Relative: 1 %
Eosinophils Absolute: 94 cells/uL (ref 15–500)
Eosinophils Relative: 2 %
HCT: 49.8 % (ref 38.5–50.0)
Hemoglobin: 16.8 g/dL (ref 13.2–17.1)
Lymphocytes Relative: 22 %
Lymphs Abs: 1034 cells/uL (ref 850–3900)
MCH: 32.2 pg (ref 27.0–33.0)
MCHC: 33.7 g/dL (ref 32.0–36.0)
MCV: 95.6 fL (ref 80.0–100.0)
MPV: 10 fL (ref 7.5–12.5)
Monocytes Absolute: 470 cells/uL (ref 200–950)
Monocytes Relative: 10 %
Neutro Abs: 3055 cells/uL (ref 1500–7800)
Neutrophils Relative %: 65 %
Platelets: 191 10*3/uL (ref 140–400)
RBC: 5.21 MIL/uL (ref 4.20–5.80)
RDW: 13.7 % (ref 11.0–15.0)
WBC: 4.7 10*3/uL (ref 4.0–10.5)

## 2016-03-28 ENCOUNTER — Other Ambulatory Visit (INDEPENDENT_AMBULATORY_CARE_PROVIDER_SITE_OTHER): Payer: BLUE CROSS/BLUE SHIELD

## 2016-03-28 DIAGNOSIS — R319 Hematuria, unspecified: Secondary | ICD-10-CM | POA: Diagnosis not present

## 2016-03-28 LAB — POCT URINALYSIS DIPSTICK
Bilirubin, UA: NEGATIVE
Blood, UA: NEGATIVE
Glucose, UA: NEGATIVE
Ketones, UA: NEGATIVE
Leukocytes, UA: NEGATIVE
Nitrite, UA: NEGATIVE
Protein, UA: NEGATIVE
Spec Grav, UA: 1.025
Urobilinogen, UA: NEGATIVE
pH, UA: 6

## 2016-05-16 ENCOUNTER — Other Ambulatory Visit: Payer: Self-pay | Admitting: Family Medicine

## 2016-08-12 ENCOUNTER — Encounter: Payer: BLUE CROSS/BLUE SHIELD | Admitting: Family Medicine

## 2016-08-19 ENCOUNTER — Other Ambulatory Visit: Payer: Self-pay | Admitting: Family Medicine

## 2016-09-10 ENCOUNTER — Encounter: Payer: BLUE CROSS/BLUE SHIELD | Admitting: Family Medicine

## 2016-09-14 ENCOUNTER — Ambulatory Visit (HOSPITAL_COMMUNITY)
Admission: EM | Admit: 2016-09-14 | Discharge: 2016-09-14 | Disposition: A | Discharge status: Home / Self Care | Payer: BLUE CROSS/BLUE SHIELD | Attending: Family Medicine | Admitting: Family Medicine

## 2016-09-14 ENCOUNTER — Encounter (HOSPITAL_COMMUNITY): Payer: Self-pay | Admitting: Emergency Medicine

## 2016-09-14 DIAGNOSIS — E785 Hyperlipidemia, unspecified: Secondary | ICD-10-CM | POA: Insufficient documentation

## 2016-09-14 DIAGNOSIS — J029 Acute pharyngitis, unspecified: Secondary | ICD-10-CM | POA: Insufficient documentation

## 2016-09-14 DIAGNOSIS — Z8249 Family history of ischemic heart disease and other diseases of the circulatory system: Secondary | ICD-10-CM | POA: Insufficient documentation

## 2016-09-14 DIAGNOSIS — Z79899 Other long term (current) drug therapy: Secondary | ICD-10-CM | POA: Insufficient documentation

## 2016-09-14 DIAGNOSIS — Z9889 Other specified postprocedural states: Secondary | ICD-10-CM | POA: Diagnosis not present

## 2016-09-14 DIAGNOSIS — Z833 Family history of diabetes mellitus: Secondary | ICD-10-CM | POA: Insufficient documentation

## 2016-09-14 DIAGNOSIS — Z7982 Long term (current) use of aspirin: Secondary | ICD-10-CM | POA: Insufficient documentation

## 2016-09-14 LAB — POCT RAPID STREP A: Streptococcus, Group A Screen (Direct): NEGATIVE

## 2016-09-14 MED ORDER — PENICILLIN V POTASSIUM 500 MG PO TABS: 500.0000 mg | ORAL_TABLET | Freq: Two times a day (BID) | ORAL | 0 refills | Status: AC

## 2016-09-14 MED ORDER — DEXAMETHASONE SODIUM PHOSPHATE 10 MG/ML IJ SOLN
INTRAMUSCULAR | Status: AC
  Filled 2016-09-14: qty 1

## 2016-09-14 MED ORDER — DEXAMETHASONE SODIUM PHOSPHATE 10 MG/ML IJ SOLN
10.0000 mg | Freq: Once | INTRAMUSCULAR | Status: AC
  Administered 2016-09-14: 10 mg via INTRAMUSCULAR

## 2016-09-14 MED ORDER — MAGIC MOUTHWASH W/LIDOCAINE: 5.0000 mL | Freq: Three times a day (TID) | ORAL | 0 refills | Status: DC | PRN

## 2016-09-14 NOTE — Discharge Instructions (Signed)
Continue to take ibuprofen or motrin as instructed ont he back of the box and push fluids

## 2016-09-14 NOTE — ED Provider Notes (Signed)
CSN: [id number]     Arrival date & time 09/14/16  1202 History   None    Chief Complaint  Patient presents with  . Sore Throat   (Consider location/radiation/quality/duration/timing/severity/associated sxs/prior Treatment) 65y.o. male presents with sore throat  X 2 days . Condition is acute  in nature. Condition is made better by nothing Condition is made worse by nothing. Patient denies any relief from motrin and "throat drops"  prior to there arrival at this facility.        Past Medical History:  Diagnosis Date  . Dyslipidemia   . ED (erectile dysfunction)   . Hyperlipidemia    Past Surgical History:  Procedure Laterality Date  . COLONOSCOPY     x2  . HERNIA REPAIR  06/16/12   [facility]  . INGUINAL HERNIA REPAIR  06/16/2012   Procedure: HERNIA REPAIR INGUINAL ADULT;  Surgeon: Pedro Earls, MD;  Location: [facility];  Service: General;  Laterality: Left;  left inguinal hernia repair with mesh   Family History  Problem Relation Age of Onset  . Heart disease Father   . Diabetes Neg Hx   . Hypertension Neg Hx    Social History  Substance Use Topics  . Smoking status: Never Smoker  . Smokeless tobacco: Never Used  . Alcohol use Yes     Comment: wine of weekends    Review of Systems  Constitutional: Negative for fever.  HENT: Positive for sore throat.   All other systems reviewed and are negative.   Allergies  Patient has no known allergies.  Home Medications   Prior to Admission medications   Medication Sig Start Date End Date Taking? Authorizing Provider  atorvastatin (LIPITOR) 20 MG tablet TAKE 1 TABLET DAILY 08/20/16  Yes Denita Lung, MD  aspirin 81 MG tablet Take 81 mg by mouth daily.      Historical Provider, MD  co-enzyme Q-10 30 MG capsule Take 100 mg by mouth daily.     Historical Provider, MD  FIBER PO Take by mouth daily.    Historical Provider, MD  fish oil-omega-3 fatty acids 1000 MG capsule Take 2 g by mouth daily.      Historical  Provider, MD  Halcinonide (HALOG) 0.1 % CREA Apply topically.    Historical Provider, MD  hydrOXYzine (ATARAX/VISTARIL) 25 MG tablet Take 25 mg by mouth 3 (three) times daily as needed.    Historical Provider, MD  magic mouthwash w/lidocaine SOLN Take 5 mLs by mouth 3 (three) times daily as needed for mouth pain. 09/14/16   Jacqualine Mau, NP  penicillin v potassium (VEETID) 500 MG tablet Take 1 tablet (500 mg total) by mouth 2 (two) times daily. 09/14/16 09/24/16  Jacqualine Mau, NP   Meds Ordered and Administered this Visit   Medications  dexamethasone (DECADRON) injection 10 mg (not administered)    BP 135/73 (BP Location: Right Arm)   Pulse 70   Temp 98.4 F (36.9 C) (Oral)   Resp 16   SpO2 98%  No data found.   Physical Exam  Constitutional: He is oriented to person, place, and time. He appears well-developed and well-nourished.  HENT:  Head: Normocephalic.  Enlarged tonsil  + 3 on right. Enlarged lymph submadibular.  Neck: Normal range of motion.  Cardiovascular: Normal rate and regular rhythm.   Pulmonary/Chest: Effort normal and breath sounds normal.  Musculoskeletal: Normal range of motion.  Neurological: He is alert and oriented to person, place, and time.  Skin:  Skin is dry.  Psychiatric: He has a normal mood and affect.  Nursing note and vitals reviewed.   Urgent Care Course   Clinical Course     Procedures (including critical care time)  Labs Review Labs Reviewed  RAPID STREP SCREEN (NOT AT [facility])  POCT RAPID STREP A    Imaging Review No results found.     MDM   1. Pharyngitis, unspecified etiology        Jacqualine Mau, NP 09/14/16 1304

## 2016-09-14 NOTE — ED Triage Notes (Signed)
Here for ST onset yest associated w/dysphagia, right ear pain  Denies fevers  Reports  He just flew from [place] yest  Taking OTC ibup and cough drops w/no relief.   Also reports he takes doxy 100 mg for rosacea   A&O x4... NAD

## 2016-09-17 ENCOUNTER — Encounter: Payer: Self-pay | Admitting: Family Medicine

## 2016-09-17 ENCOUNTER — Ambulatory Visit (INDEPENDENT_AMBULATORY_CARE_PROVIDER_SITE_OTHER): Payer: BLUE CROSS/BLUE SHIELD | Admitting: Family Medicine

## 2016-09-17 VITALS — BP 120/70 | HR 74 | Wt 137.4 lb

## 2016-09-17 DIAGNOSIS — J36 Peritonsillar abscess: Secondary | ICD-10-CM | POA: Diagnosis not present

## 2016-09-17 LAB — CULTURE, GROUP A STREP (THRC)

## 2016-09-17 MED ORDER — AMOXICILLIN-POT CLAVULANATE 875-125 MG PO TABS: 1.0000 | ORAL_TABLET | Freq: Two times a day (BID) | ORAL | 0 refills | Status: DC

## 2016-09-17 NOTE — Progress Notes (Signed)
   Subjective:    Patient ID: Edwin Jimenez, male    DOB: 09/13/1951, 65 y.o.   MRN: [id number]  HPI ays ago he started feeling quite bad and shortly thereafter developed a sore throat especially on the right making it difficult for him to swallow. He was seen in an urgent care center on Saturday. He was given a Decadron injection as well as given Pen-Vee K 500 mg twice a day. Apparently the strep test was negative.  Review of Systems     Objective:   Physical Exam Alert and feeling uncomfortable. TMs are clear. Throat does show redness and slight swelling in the right peritonsillar area with fullness to palpation. Neck is supple with right cervical adenopathy.       Assessment & Plan:  Abscess, peritonsillar - Plan: amoxicillin-clavulanate (AUGMENTIN) 875-125 MG tablet Was on too low a dose of the Pen-Vee K. I will switch him to Augmentin. Cautioned that if his symptoms worsen, we might need to send to ENT for drainage of the abscess. He will keep you informed.

## 2016-09-18 ENCOUNTER — Other Ambulatory Visit: Payer: Self-pay

## 2016-09-18 DIAGNOSIS — J36 Peritonsillar abscess: Secondary | ICD-10-CM

## 2016-09-18 NOTE — Progress Notes (Signed)
Per Dr. Redmond School- pt needs to see ENT ASAP. Called Dr. Pollie Friar office and they can see him at 10:15 this morning. Request referral and notes faxed to their office. Dr. Redmond School will notify pt of appt and location. Victorino December

## 2016-10-02 ENCOUNTER — Encounter: Payer: BLUE CROSS/BLUE SHIELD | Admitting: Family Medicine

## 2016-10-22 ENCOUNTER — Ambulatory Visit (INDEPENDENT_AMBULATORY_CARE_PROVIDER_SITE_OTHER): Payer: BLUE CROSS/BLUE SHIELD | Admitting: Family Medicine

## 2016-10-22 ENCOUNTER — Encounter: Payer: Self-pay | Admitting: Family Medicine

## 2016-10-22 VITALS — BP 110/64 | HR 55 | Ht 67.0 in | Wt 133.0 lb

## 2016-10-22 DIAGNOSIS — Z23 Encounter for immunization: Secondary | ICD-10-CM

## 2016-10-22 DIAGNOSIS — Z79899 Other long term (current) drug therapy: Secondary | ICD-10-CM

## 2016-10-22 DIAGNOSIS — Z125 Encounter for screening for malignant neoplasm of prostate: Secondary | ICD-10-CM

## 2016-10-22 DIAGNOSIS — N529 Male erectile dysfunction, unspecified: Secondary | ICD-10-CM

## 2016-10-22 DIAGNOSIS — E785 Hyperlipidemia, unspecified: Secondary | ICD-10-CM | POA: Diagnosis not present

## 2016-10-22 DIAGNOSIS — N432 Other hydrocele: Secondary | ICD-10-CM

## 2016-10-22 DIAGNOSIS — L719 Rosacea, unspecified: Secondary | ICD-10-CM

## 2016-10-22 DIAGNOSIS — D179 Benign lipomatous neoplasm, unspecified: Secondary | ICD-10-CM | POA: Diagnosis not present

## 2016-10-22 DIAGNOSIS — Z0000 Encounter for general adult medical examination without abnormal findings: Secondary | ICD-10-CM

## 2016-10-22 DIAGNOSIS — Z8249 Family history of ischemic heart disease and other diseases of the circulatory system: Secondary | ICD-10-CM

## 2016-10-22 DIAGNOSIS — L309 Dermatitis, unspecified: Secondary | ICD-10-CM | POA: Diagnosis not present

## 2016-10-22 LAB — CBC WITH DIFFERENTIAL/PLATELET
Basophils Absolute: 46 cells/uL (ref 0–200)
Basophils Relative: 1 %
Eosinophils Absolute: 46 cells/uL (ref 15–500)
Eosinophils Relative: 1 %
HCT: 44 % (ref 38.5–50.0)
Hemoglobin: 14.7 g/dL (ref 13.2–17.1)
Lymphocytes Relative: 28 %
Lymphs Abs: 1288 cells/uL (ref 850–3900)
MCH: 32 pg (ref 27.0–33.0)
MCHC: 33.4 g/dL (ref 32.0–36.0)
MCV: 95.9 fL (ref 80.0–100.0)
MPV: 10.3 fL (ref 7.5–12.5)
Monocytes Absolute: 368 cells/uL (ref 200–950)
Monocytes Relative: 8 %
Neutro Abs: 2852 cells/uL (ref 1500–7800)
Neutrophils Relative %: 62 %
Platelets: 189 10*3/uL (ref 140–400)
RBC: 4.59 MIL/uL (ref 4.20–5.80)
RDW: 13.5 % (ref 11.0–15.0)
WBC: 4.6 10*3/uL (ref 4.0–10.5)

## 2016-10-22 LAB — COMPREHENSIVE METABOLIC PANEL
ALT: 38 U/L (ref 9–46)
AST: 36 U/L — ABNORMAL HIGH (ref 10–35)
Albumin: 4.1 g/dL (ref 3.6–5.1)
Alkaline Phosphatase: 63 U/L (ref 40–115)
BUN: 20 mg/dL (ref 7–25)
CO2: 27 mmol/L (ref 20–31)
Calcium: 9 mg/dL (ref 8.6–10.3)
Chloride: 107 mmol/L (ref 98–110)
Creat: 0.89 mg/dL (ref 0.70–1.25)
Glucose, Bld: 98 mg/dL (ref 65–99)
Potassium: 4.4 mmol/L (ref 3.5–5.3)
Sodium: 141 mmol/L (ref 135–146)
Total Bilirubin: 0.8 mg/dL (ref 0.2–1.2)
Total Protein: 6.4 g/dL (ref 6.1–8.1)

## 2016-10-22 LAB — POCT URINALYSIS DIPSTICK
Bilirubin, UA: NEGATIVE
Blood, UA: NEGATIVE
Glucose, UA: NEGATIVE
Ketones, UA: NEGATIVE
Leukocytes, UA: NEGATIVE
Nitrite, UA: NEGATIVE
Protein, UA: NEGATIVE
Spec Grav, UA: 1.025
Urobilinogen, UA: NEGATIVE
pH, UA: 5.5

## 2016-10-22 LAB — LIPID PANEL
Cholesterol: 120 mg/dL (ref <200)
HDL: 61 mg/dL (ref >40)
LDL Cholesterol: 51 mg/dL (ref <100)
Total CHOL/HDL Ratio: 2 Ratio (ref <5.0)
Triglycerides: 40 mg/dL (ref <150)
VLDL: 8 mg/dL (ref <30)

## 2016-10-22 LAB — PSA: PSA: 0.9 ng/mL (ref <=4.0)

## 2016-10-22 MED ORDER — TADALAFIL 5 MG PO TABS: 5.0000 mg | ORAL_TABLET | Freq: Every day | ORAL | 0 refills | Status: DC | PRN

## 2016-10-22 MED ORDER — ATORVASTATIN CALCIUM 20 MG PO TABS: 20.0000 mg | ORAL_TABLET | Freq: Every day | ORAL | 3 refills | Status: DC

## 2016-10-22 NOTE — Progress Notes (Signed)
Subjective:    Patient ID: Edwin Jimenez, male    DOB: 02/23/1951, 66 y.o.   MRN: [id number]  HPI He is here for complete examination. He continues on his statin drug and is having no difficulty with them. He does see a dermatologist for his underlying rosacea as well as eczema. He also has a previous history of lipomas, none of these have changed. There is a family history of heart disease. He also has had intermittent ED issues and does occasionally use Cialis. He has had difficulty with right testicular discomfort. He has had previous urologic issues mainly dealing with urinary frequency. He was given bladder training but stopped doing that. He exercises regularly. His work is going well. He is considering retiring in the near future. Family and social history as well as health maintenance and immunizations were reviewed. He has no other concerns or complaints.   Review of Systems  All other systems reviewed and are negative.      Objective:   Physical Exam BP 110/64   Pulse (!) 55   Ht 5\' 7"  (1.702 m)   Wt 133 lb (60.3 kg)   SpO2 97%   BMI 20.83 kg/m   General Appearance:    Alert, cooperative, no distress, appears stated age  Head:    Normocephalic, without obvious abnormality, atraumatic  Eyes:    PERRL, conjunctiva/corneas clear, EOM's intact, fundi    benign  Ears:    Normal TM's and external ear canals  Nose:   Nares normal, mucosa normal, no drainage or sinus   tenderness  Throat:   Lips, mucosa, and tongue normal; teeth and gums normal  Neck:   Supple, no lymphadenopathy;  thyroid:  no   enlargement/tenderness/nodules; no carotid   bruit or JVD     Lungs:     Clear to auscultation bilaterally without wheezes, rales or     ronchi; respirations unlabored      Heart:    Regular rate and rhythm, S1 and S2 normal, no murmur, rub   or gallop     Abdomen:     Soft, non-tender, nondistended, normoactive bowel sounds,    no masses, no hepatosplenomegaly  Genitalia:     Normal male external genitalia without lesions.  TesticleOn the left is normal, right is slightly large with a tender transilluminating lesion superior to the teste no inguinal hernias.  Rectal:    Deferred   Extremities:   No clubbing, cyanosis or edema  Pulses:   2+ and symmetric all extremities  Skin:   Skin color, texture, turgor normal, no rashes or lesions  Lymph nodes:   Cervical, supraclavicular, and axillary nodes normal  Neurologic:   CNII-XII intact, normal strength, sensation and gait; reflexes 2+ and symmetric throughout          Psych:   Normal mood, affect, hygiene and grooming.          Assessment & Plan:  Routine general medical examination at a health care facility - Plan: POCT Urinalysis Dipstick, CBC with Differential/Platelet, Comprehensive metabolic panel, Lipid panel  Rosacea  Eczema, unspecified type  Multiple lipomas  Family history of ischemic heart disease (IHD) - Plan: CBC with Differential/Platelet, Comprehensive metabolic panel, Lipid panel  Erectile dysfunction, unspecified erectile dysfunction type - Plan: tadalafil (CIALIS) 5 MG tablet  Need for prophylactic vaccination against Streptococcus pneumoniae (pneumococcus) - Plan: Pneumococcal conjugate vaccine 13-valent  Other hydrocele - Plan: Ambulatory referral to Urology  Screening for prostate cancer - Plan: PSA  Encounter for long-term (current) use of medications - Plan: CBC with Differential/Platelet, Comprehensive metabolic panel, Lipid panel  Hyperlipidemia with target LDL less than 70 - Plan: Lipid panel Discussed skin care with him in terms of using shorter showers and only cleaning the vital areas. 10 you on present medications. A sample of Cialis given with instructions on proper use. His immunizations were updated. Will refer to urology due to the tender nature of his hydrocele.

## 2016-11-08 ENCOUNTER — Telehealth: Payer: Self-pay | Admitting: Family Medicine

## 2016-11-08 MED ORDER — AMOXICILLIN-POT CLAVULANATE 875-125 MG PO TABS: 1.0000 | ORAL_TABLET | Freq: Two times a day (BID) | ORAL | 0 refills | Status: DC

## 2016-11-08 NOTE — Telephone Encounter (Signed)
He has had a recent trip to [place]. He did start getting URI symptoms while he was there. He has been back for a day or 2 and is no noticing increasing nasal congestion, purulent postnasal drainage, malaise. I will place him on an antibiotic.

## 2016-11-08 NOTE — Telephone Encounter (Signed)
Pt has cough, chest congestion, coughing up green phlegm. This all started a week ago but has gotten bad over past few days. Requesting med to [facility] at [facility] at [facility]

## 2016-11-10 ENCOUNTER — Emergency Department (HOSPITAL_COMMUNITY)
Admission: EM | Admit: 2016-11-10 | Discharge: 2016-11-10 | Disposition: A | Discharge status: Home / Self Care | Payer: BLUE CROSS/BLUE SHIELD | Attending: Emergency Medicine | Admitting: Emergency Medicine

## 2016-11-10 ENCOUNTER — Emergency Department (HOSPITAL_COMMUNITY): Payer: BLUE CROSS/BLUE SHIELD

## 2016-11-10 ENCOUNTER — Encounter (HOSPITAL_COMMUNITY): Payer: Self-pay | Admitting: Emergency Medicine

## 2016-11-10 DIAGNOSIS — R05 Cough: Secondary | ICD-10-CM | POA: Diagnosis present

## 2016-11-10 DIAGNOSIS — Z79899 Other long term (current) drug therapy: Secondary | ICD-10-CM | POA: Diagnosis not present

## 2016-11-10 DIAGNOSIS — J189 Pneumonia, unspecified organism: Secondary | ICD-10-CM | POA: Diagnosis not present

## 2016-11-10 DIAGNOSIS — Z7982 Long term (current) use of aspirin: Secondary | ICD-10-CM | POA: Diagnosis not present

## 2016-11-10 LAB — CBC
HCT: 45.3 % (ref 39.0–52.0)
Hemoglobin: 15.9 g/dL (ref 13.0–17.0)
MCH: 32.3 pg (ref 26.0–34.0)
MCHC: 35.1 g/dL (ref 30.0–36.0)
MCV: 91.9 fL (ref 78.0–100.0)
Platelets: 159 10*3/uL (ref 150–400)
RBC: 4.93 MIL/uL (ref 4.22–5.81)
RDW: 12.9 % (ref 11.5–15.5)
WBC: 6.3 10*3/uL (ref 4.0–10.5)

## 2016-11-10 LAB — BASIC METABOLIC PANEL
Anion gap: 8 (ref 5–15)
BUN: 19 mg/dL (ref 6–20)
CO2: 24 mmol/L (ref 22–32)
Calcium: 8.6 mg/dL — ABNORMAL LOW (ref 8.9–10.3)
Chloride: 104 mmol/L (ref 101–111)
Creatinine, Ser: 0.88 mg/dL (ref 0.61–1.24)
GFR calc Af Amer: >60 mL/min (ref >60)
GFR calc non Af Amer: >60 mL/min (ref >60)
Glucose, Bld: 118 mg/dL — ABNORMAL HIGH (ref 65–99)
Potassium: 4 mmol/L (ref 3.5–5.1)
Sodium: 136 mmol/L (ref 135–145)

## 2016-11-10 LAB — I-STAT TROPONIN, ED: Troponin i, poc: 0 ng/mL (ref 0.00–0.08)

## 2016-11-10 MED ORDER — DOXYCYCLINE HYCLATE 100 MG PO CAPS: 100.0000 mg | ORAL_CAPSULE | Freq: Two times a day (BID) | ORAL | 0 refills | Status: DC

## 2016-11-10 MED ORDER — DEXTROSE 5 % IV SOLN
1.0000 g | Freq: Once | INTRAVENOUS | Status: AC
  Administered 2016-11-10: 1 g via INTRAVENOUS
  Filled 2016-11-10: qty 10

## 2016-11-10 MED ORDER — SODIUM CHLORIDE 0.9 % IV BOLUS (SEPSIS)
1000.0000 mL | Freq: Once | INTRAVENOUS | Status: AC
  Administered 2016-11-10: 1000 mL via INTRAVENOUS

## 2016-11-10 MED ORDER — BENZONATATE 100 MG PO CAPS: 100.0000 mg | ORAL_CAPSULE | Freq: Three times a day (TID) | ORAL | 0 refills | Status: DC

## 2016-11-10 NOTE — ED Triage Notes (Signed)
Patient states that he flew back from [place] on Wed night. While there he started having cough and not feeling well. Since he has chest heaviness and pressure, productive yellow cough, body aches, sore throat, but denies n/v/d.

## 2016-11-10 NOTE — Discharge Instructions (Signed)
Finish the course of Augmentin. The Tessalon is a medication to help with the cough.  Take doxycycline 100 mg twice daily for the next 10 days. If you have enough doxycycline from previous prescriptions you do not have to fill this prescription.  Follow up with Dr. Redmond School next week to make sure you're improving. Return to the emergency room as needed for worsening symptoms

## 2016-11-10 NOTE — ED Provider Notes (Signed)
[facility] DEPT Provider Note   CSN: [id number] Arrival date & time: 11/10/16  [id number]     History   Chief Complaint Chief Complaint  Patient presents with  . Chest Pain  . Cough    HPI Edwin Jimenez is a 66 y.o. male.  HPI Pt started with cough and congestion on Wednesday night.  He recently travelled from [place].  He started coughing up yellow and green sputum.  He called Dr Redmond School and was started on Augmentin.  Pt has not felt any better.  He continues to cough.  He has had chills but no known fevers.   He also has been having muscle cramps in his feet and toes.  No vomiting or diarrhea.  Pt has had a pneumovax previously and also had the flu shot this year.   Past Medical History:  Diagnosis Date  . Dyslipidemia   . ED (erectile dysfunction)   . Hyperlipidemia     Patient Active Problem List   Diagnosis Date Noted  . Rosacea 05/19/2014  . Multiple lipomas 05/19/2014  . Family history of ischemic heart disease (IHD) 09/04/2011  . Hyperlipidemia with target LDL less than 70 09/04/2011  . ED (erectile dysfunction) 09/04/2011  . HALLUX RIGIDUS 07/11/2008    Past Surgical History:  Procedure Laterality Date  . COLONOSCOPY     x2  . HERNIA REPAIR  06/16/12   [facility]  . INGUINAL HERNIA REPAIR  06/16/2012   Procedure: HERNIA REPAIR INGUINAL ADULT;  Surgeon: Pedro Earls, MD;  Location: [facility];  Service: General;  Laterality: Left;  left inguinal hernia repair with mesh       Home Medications    Prior to Admission medications   Medication Sig Start Date End Date Taking? Authorizing Provider  amoxicillin-clavulanate (AUGMENTIN) 875-125 MG tablet Take 1 tablet by mouth 2 (two) times daily. 11/08/16   Denita Lung, MD  aspirin EC 81 MG tablet Take 81 mg by mouth daily.    Historical Provider, MD  atorvastatin (LIPITOR) 20 MG tablet Take 1 tablet (20 mg total) by mouth daily. 10/22/16   Denita Lung, MD  Azelaic Acid (FINACEA) 15 % cream Apply  topically 2 (two) times daily. After skin is thoroughly washed and patted dry, gently but thoroughly massage a thin film of azelaic acid cream into the affected area twice daily, in the morning and evening.    Historical Provider, MD  benzonatate (TESSALON) 100 MG capsule Take 1 capsule (100 mg total) by mouth every 8 (eight) hours. 11/10/16   Dorie Rank, MD  co-enzyme Q-10 30 MG capsule Take 100 mg by mouth daily.     Historical Provider, MD  Crisaborole (EUCRISA) 2 % OINT Apply topically.    Historical Provider, MD  doxycycline (VIBRAMYCIN) 100 MG capsule Take 100 mg by mouth daily.    Historical Provider, MD  doxycycline (VIBRAMYCIN) 100 MG capsule Take 1 capsule (100 mg total) by mouth 2 (two) times daily. 11/10/16   Dorie Rank, MD  FIBER PO Take by mouth daily.    Historical Provider, MD  fish oil-omega-3 fatty acids 1000 MG capsule Take 2 g by mouth daily.      Historical Provider, MD  halobetasol (ULTRAVATE) 0.05 % cream Apply topically 2 (two) times daily.    Historical Provider, MD  hydrOXYzine (ATARAX/VISTARIL) 25 MG tablet Take 25 mg by mouth 3 (three) times daily as needed.    Historical Provider, MD  tadalafil (CIALIS) 5 MG tablet Take  1 tablet (5 mg total) by mouth daily as needed for erectile dysfunction. 10/22/16   Denita Lung, MD    Family History Family History  Problem Relation Age of Onset  . Heart disease Father   . Diabetes Neg Hx   . Hypertension Neg Hx     Social History Social History  Substance Use Topics  . Smoking status: Never Smoker  . Smokeless tobacco: Never Used  . Alcohol use Yes     Comment: wine of weekends     Allergies   Patient has no known allergies.   Review of Systems Review of Systems  All other systems reviewed and are negative.    Physical Exam Updated Vital Signs BP 123/78   Pulse 62   Temp 98.3 F (36.8 C) (Oral)   Resp 16   Ht 5\' 7"  (1.702 m)   Wt 60.3 kg   SpO2 97%   BMI 20.83 kg/m   Physical Exam  Constitutional: He  appears well-developed and well-nourished. No distress.  HENT:  Head: Normocephalic and atraumatic.  Right Ear: External ear normal.  Left Ear: External ear normal.  Mouth/Throat: No oropharyngeal exudate.  Eyes: Conjunctivae are normal. Right eye exhibits no discharge. Left eye exhibits no discharge. No scleral icterus.  Neck: Neck supple. No tracheal deviation present.  Cardiovascular: Normal rate, regular rhythm and intact distal pulses.   Pulmonary/Chest: Effort normal and breath sounds normal. No stridor. No respiratory distress. He has no wheezes. He has no rales.  Abdominal: Soft. Bowel sounds are normal. He exhibits no distension. There is no tenderness. There is no rebound and no guarding.  Musculoskeletal: He exhibits no edema or tenderness.  Neurological: He is alert. He has normal strength. No cranial nerve deficit (no facial droop, extraocular movements intact, no slurred speech) or sensory deficit. He exhibits normal muscle tone. He displays no seizure activity. Coordination normal.  Skin: Skin is warm and dry. No rash noted.  Psychiatric: He has a normal mood and affect.  Nursing note and vitals reviewed.    ED Treatments / Results  Labs (all labs ordered are listed, but only abnormal results are displayed) Labs Reviewed  BASIC METABOLIC PANEL - Abnormal; Notable for the following:       Result Value   Glucose, Bld 118 (*)    Calcium 8.6 (*)    All other components within normal limits  CBC  I-STAT TROPOININ, ED    EKG  EKG Interpretation  Date/Time:  Sunday November 10 2016 08:42:45 EST Ventricular Rate:  83 PR Interval:    QRS Duration: 80 QT Interval:  369 QTC Calculation: 434 R Axis:   116 Text Interpretation:  Right and left arm electrode reversal, interpretation assumes no reversal Sinus rhythm Biatrial enlargement LVH by voltage Probable lateral infarct, age indeterminate No previous tracing Confirmed by Shawnn Bouillon  MD-J, Kellon Chalk [phone]) on 11/10/2016 10:44:16  AM       Radiology Dg Chest 2 View  Result Date: 11/10/2016 CLINICAL DATA:  66 year old male status post travel to [place], recently returned. Cough and malaise onset during his trip. Chest pain with productive cough and sore throat. Initial encounter. EXAM: CHEST  2 VIEW COMPARISON:  None. FINDINGS: Normal lung volumes. Normal cardiac size and mediastinal contours. Visualized tracheal air column is within normal limits. EKG button artifact. The upper lungs are clear. No pneumothorax, pulmonary edema or pleural effusion. Suspect external artifact resulting in a linear a age over the mid right lung There is an 30  mm nodule projecting at the anterior right sixth rib. Furthermore, on the lateral view there is patchy increased lower lobe opacity which is not well correlated on the frontal. This might be in the right lower lobe. Negative visible bowel gas pattern. No acute osseous abnormality identified. IMPRESSION: Patchy abnormal right lower lobe lung opacity, including an 11 mm lung nodule projecting at the anterior right sixth rib. No pleural effusion. If pneumonia is suspected clinically then followup with PA and lateral chest X-ray in 3-4 weeks following trial of antibiotic therapy to ensure resolution and exclude underlying malignancy. If infection is not suspected then further characterize with chest CT (IV contrast preferred). Electronically Signed   By: Genevie Ann M.D.   On: 11/10/2016 10:12    Procedures Procedures (including critical care time)  Medications Ordered in ED Medications  cefTRIAXone (ROCEPHIN) 1 g in dextrose 5 % 50 mL IVPB (1 g Intravenous New Bag/Given 11/10/16 1154)  sodium chloride 0.9 % bolus 1,000 mL (1,000 mLs Intravenous New Bag/Given 11/10/16 1156)     Initial Impression / Assessment and Plan / ED Course  I have reviewed the triage vital signs and the nursing notes.  Pertinent labs & imaging results that were available during my care of the patient were reviewed by me and  considered in my medical decision making (see chart for details).   the patient presents to the emergency room with complaints of cough, and congestion, body aches, and sore throat.  Symptoms are suggestive of an influenza-like illness however pneumonia is a concern as well. Laboratory tests are reassuring but his chest x-ray does show an infiltrate suggestive of pneumonia.  He has Been taking Augmentin for the last 2 days and is not feeling any better. I will add on doxycycline to cover for atypical infectione.  He  should finish his course of Augmentin.  I will give him a prescription for Tessalon to help with the cough. Plan on follow-up with Dr. Redmond School next week. I encouraged him to drink plenty fluids and rest  Final Clinical Impressions(s) / ED Diagnoses   Final diagnoses:  Community acquired pneumonia, unspecified laterality    New Prescriptions New Prescriptions   BENZONATATE (TESSALON) 100 MG CAPSULE    Take 1 capsule (100 mg total) by mouth every 8 (eight) hours.   DOXYCYCLINE (VIBRAMYCIN) 100 MG CAPSULE    Take 1 capsule (100 mg total) by mouth 2 (two) times daily.     Dorie Rank, MD 11/10/16 1259

## 2016-11-10 NOTE — ED Notes (Signed)
Patient d/c'd self care.  F/U and medications reviewed.  Patient verbalized understanding. 

## 2016-11-12 ENCOUNTER — Other Ambulatory Visit: Payer: Self-pay | Admitting: Family Medicine

## 2016-11-12 DIAGNOSIS — J181 Lobar pneumonia, unspecified organism: Principal | ICD-10-CM

## 2016-11-12 DIAGNOSIS — J189 Pneumonia, unspecified organism: Secondary | ICD-10-CM

## 2017-07-17 ENCOUNTER — Encounter: Payer: Self-pay | Admitting: Family Medicine

## 2017-07-17 ENCOUNTER — Ambulatory Visit (INDEPENDENT_AMBULATORY_CARE_PROVIDER_SITE_OTHER): Payer: Medicare Other | Admitting: Family Medicine

## 2017-07-17 VITALS — BP 110/70 | HR 66 | Temp 97.9°F | Resp 16 | Wt 136.0 lb

## 2017-07-17 DIAGNOSIS — J0140 Acute pansinusitis, unspecified: Secondary | ICD-10-CM | POA: Diagnosis not present

## 2017-07-17 MED ORDER — AMOXICILLIN 875 MG PO TABS: 875.0000 mg | ORAL_TABLET | Freq: Two times a day (BID) | ORAL | 0 refills | Status: DC

## 2017-07-17 NOTE — Progress Notes (Signed)
Chief Complaint  Patient presents with  . sick    started last friday, sore throat,  sinuses, pressure, coughing, going into chest. had pneumonia last year just want to make sure hes not getting it again    Subjective:  Edwin Jimenez is a 66 y.o. male who presents for a one week history of nasal congestion, sore throat, sinus pain, and feels like he is progressively worsening. Now has a congested cough. States he ran a marathon over the weekend in [place]. Has not been hydrating.   Denies fever, chills, chest pain, shortness of breath, wheezing, abdominal pain, N/V/D.   States he had pneumonia last year in January. Does not feel like he has this now.   Is not a smoker. No recent antibiotics.   Treatment to date: Sudafed.  Denies sick contacts.  No other aggravating or relieving factors.  No other c/o.  ROS as in subjective.   Objective: Vitals:   07/17/17 0851  BP: 110/70  Pulse: 66  Resp: 16  Temp: 97.9 F (36.6 C)  SpO2: 99%    General appearance: Alert, WD/WN, no distress, mildly ill appearing                             Skin: warm, no rash                           Head: mild frontal and maxillary sinus tenderness                            Eyes: conjunctiva normal, corneas clear, PERRLA                            Ears: pearly TMs, external ear canals normal                          Nose: septum midline, turbinates swollen, with erythema and clear discharge             Mouth/throat: MMM, tongue normal, mild pharyngeal erythema                           Neck: supple, no adenopathy, no thyromegaly, nontender                          Heart: RRR, normal S1, S2, no murmurs                         Lungs: CTA bilaterally, no wheezes, rales, or rhonchi      Assessment: Acute non-recurrent pansinusitis  Plan: Discussed diagnosis and treatment of acute sinusitis. Amoxil prescribed.   Suggested symptomatic OTC remedies.Nasal saline spray for congestion.  Tylenol or Ibuprofen  OTC for fever and malaise.  Call/return if he worsens or if he is not back to baseline after completing the antibiotic.

## 2017-08-04 ENCOUNTER — Other Ambulatory Visit (INDEPENDENT_AMBULATORY_CARE_PROVIDER_SITE_OTHER): Payer: Medicare Other

## 2017-08-04 DIAGNOSIS — Z23 Encounter for immunization: Secondary | ICD-10-CM | POA: Diagnosis not present

## 2017-09-09 ENCOUNTER — Ambulatory Visit (INDEPENDENT_AMBULATORY_CARE_PROVIDER_SITE_OTHER): Payer: Medicare Other | Admitting: Sports Medicine

## 2017-09-09 ENCOUNTER — Encounter: Payer: Self-pay | Admitting: Sports Medicine

## 2017-09-09 DIAGNOSIS — S86812A Strain of other muscle(s) and tendon(s) at lower leg level, left leg, initial encounter: Secondary | ICD-10-CM

## 2017-09-09 DIAGNOSIS — S86112D Strain of other muscle(s) and tendon(s) of posterior muscle group at lower leg level, left leg, subsequent encounter: Secondary | ICD-10-CM | POA: Diagnosis not present

## 2017-09-10 ENCOUNTER — Encounter: Payer: Self-pay | Admitting: Sports Medicine

## 2017-09-10 NOTE — Progress Notes (Signed)
   Subjective:    Patient ID: CHARBEL LOS, male    DOB: 1951/04/05, 66 y.o.   MRN: [id number]  HPI chief complaint: Left lower leg pain  [name] comes in today complaining of one week of left lower leg pain. He is an avid runner. He does not recall any specific injury but while running one day he began to experience some discomfort around his Achilles tendon. Since then the pain has migrated more proximally into the calf. It is worse with ambulation and improves at rest. He has not noticed any swelling but has noticed some mild discoloration. No numbness or tingling. He's taking some intermittent Motrin which has been helpful. He's had previous injuries to his Achilles and calves in the past. Earlier today he was able to do a slow run although it was somewhat painful.  Past medical history reviewed Medications reviewed Allergies reviewed    Review of Systems as above   Objective:   Physical Exam   Well-developed, fit appearing. No acute distress. Awake alert and oriented 3. Vital signs reviewed  Left lower leg: Full ankle and knee range of motion. There is no obvious soft tissue swelling. No ecchymosis. He is tender to palpation along the medial gastrocnemius as well as along the area of the soleus muscle. No palpable defect. No tenderness more distally along the Achilles tendon. Good strength. Neurovascularly intact distally. Walking with a slight limp.   MSK ultrasound of the left lower leg was performed. Achilles tendon is intact and appears normal. Medial head of the gastrocnemius also appears normal but there are hypoechoic changes within the soleus muscle which may be chronic changes.      Assessment & Plan:   Left calf strain  5/16 inch heel lift on a green sports insole. We will give him a separate pair of heel lifts for his dress shoes. Patient has been doing some eccentric heel drop exercises which is actually worsening his symptoms. I recommended that he stop all stretching  and eccentric exercises at this time. Once his pain resolves he will start some generalized lower leg strengthening (heel walk and toe walks). I recommended that he try a compression sleeve while running and he can continue with his Motrin as needed. He may continue to increase activity as tolerated using pain as his guide. As long as he is not limping or the pain is severe, he may continue to run. He will follow-up for ongoing or recalcitrant issues.

## 2017-09-12 ENCOUNTER — Encounter: Payer: Self-pay | Admitting: Sports Medicine

## 2017-09-16 DIAGNOSIS — Z1283 Encounter for screening for malignant neoplasm of skin: Secondary | ICD-10-CM | POA: Diagnosis not present

## 2017-09-16 DIAGNOSIS — L308 Other specified dermatitis: Secondary | ICD-10-CM | POA: Diagnosis not present

## 2017-09-16 DIAGNOSIS — L718 Other rosacea: Secondary | ICD-10-CM | POA: Diagnosis not present

## 2017-09-18 ENCOUNTER — Encounter: Payer: Self-pay | Admitting: Sports Medicine

## 2017-09-18 ENCOUNTER — Ambulatory Visit (INDEPENDENT_AMBULATORY_CARE_PROVIDER_SITE_OTHER): Payer: Medicare Other | Admitting: Sports Medicine

## 2017-09-18 DIAGNOSIS — S86112D Strain of other muscle(s) and tendon(s) of posterior muscle group at lower leg level, left leg, subsequent encounter: Secondary | ICD-10-CM | POA: Diagnosis not present

## 2017-09-18 NOTE — Progress Notes (Signed)
   HPI  CC: Left leg pain Patient is here to follow-up regarding his left-sided leg pain.  He states that he continues to have pain within his calf with most exercise.  He has been attempting to train for a marathon but has been unable to over the past few weeks.  He denies any specific injury, trauma, or events which may have caused this pain to initiate.  He denies any specific swelling or erythema within this leg.  He was seen last week for the same issue and was provided heel lifts and a home exercise program.  Medications/Interventions Tried: heel lifts, HEP  See HPI and/or previous note for associated ROS.  Objective: BP 108/66   Ht 5\' 8"  (1.727 m)   Wt 135 lb (61.2 kg)   BMI 20.53 kg/m  Gen: NAD, well groomed, a/o x3, normal affect.  CV: Well-perfused. Warm.  Resp: Non-labored.  Neuro: Sensation intact throughout. No gross coordination deficits.  Gait: Nonpathologic posture, unremarkable stride without signs of limp or balance issues. Lower Leg, Left: Inspection is no evidence of erythema, ecchymosis, or swelling.  TTP along the midportion of the posterior lower leg near the myotendinous junction of the gastrocnemius.  Strength 5/5 bilaterally.  Tenderness with heel raises.  No tenderness at the Achilles tendon or calcaneus.  ROM full in all directions.  Neurovascular intact distally.  ULTRASOUND: Left Calf  Quick, limited diagnostic ultrasound obtained of patient's left calf.  -Gastrocnemius: some evidence of mild muscle fiber abnormalities within the distal aspect of the medial gastrocnemius.  No specific fluid collection. -Soleus: Significant disruption of muscle fiber integrity within the deep aspect of the soleus.  Numerous varicose vein integration within the muscle belly present.  A 6-7 cm long fluid collection present along the deep fascial plane of the soleus. -Achilles tendon: without disruption or fluid collection.   Assessment and plan:  Strain of soleus  muscle Patient appears to be dealing with some myofascial pain within the soleus and medial gastrocnemius muscles.  Some muscle integrity loss likely secondary to large varicose vein integration within the muscle body.  Fluid collection likely secondary to disruption of venous structure causing some bleeding and pain.  Will treat conservatively. -Continue conservative therapy: Compression, elevation, relative rest, ice, and heat. -Avoid stretching exercises, inclined exercises, and pushing through painful exercise. -Continue heel lifts. -Continue home exercise program. -Follow-up 1 month   Elberta Leatherwood, MD,MS [facility] 09/18/2017 12:49 PM

## 2017-09-18 NOTE — Assessment & Plan Note (Signed)
Patient appears to be dealing with some myofascial pain within the soleus and medial gastrocnemius muscles.  Some muscle integrity loss likely secondary to large varicose vein integration within the muscle body.  Fluid collection likely secondary to disruption of venous structure causing some bleeding and pain.  Will treat conservatively. -Continue conservative therapy: Compression, elevation, relative rest, ice, and heat. -Avoid stretching exercises, inclined exercises, and pushing through painful exercise. -Continue heel lifts. -Continue home exercise program. -Follow-up 1 month

## 2017-09-22 ENCOUNTER — Ambulatory Visit (INDEPENDENT_AMBULATORY_CARE_PROVIDER_SITE_OTHER): Payer: Medicare Other | Admitting: Family Medicine

## 2017-09-22 ENCOUNTER — Encounter: Payer: Self-pay | Admitting: Family Medicine

## 2017-09-22 VITALS — BP 120/72 | HR 55 | Temp 97.4°F | Ht 67.0 in | Wt 133.0 lb

## 2017-09-22 DIAGNOSIS — H259 Unspecified age-related cataract: Secondary | ICD-10-CM | POA: Diagnosis not present

## 2017-09-22 DIAGNOSIS — E785 Hyperlipidemia, unspecified: Secondary | ICD-10-CM

## 2017-09-22 DIAGNOSIS — L719 Rosacea, unspecified: Secondary | ICD-10-CM | POA: Diagnosis not present

## 2017-09-22 DIAGNOSIS — Z23 Encounter for immunization: Secondary | ICD-10-CM | POA: Diagnosis not present

## 2017-09-22 DIAGNOSIS — Z833 Family history of diabetes mellitus: Secondary | ICD-10-CM

## 2017-09-22 DIAGNOSIS — D692 Other nonthrombocytopenic purpura: Secondary | ICD-10-CM | POA: Diagnosis not present

## 2017-09-22 DIAGNOSIS — D179 Benign lipomatous neoplasm, unspecified: Secondary | ICD-10-CM

## 2017-09-22 DIAGNOSIS — N529 Male erectile dysfunction, unspecified: Secondary | ICD-10-CM

## 2017-09-22 DIAGNOSIS — Z8249 Family history of ischemic heart disease and other diseases of the circulatory system: Secondary | ICD-10-CM

## 2017-09-22 DIAGNOSIS — L309 Dermatitis, unspecified: Secondary | ICD-10-CM | POA: Diagnosis not present

## 2017-09-22 MED ORDER — SILDENAFIL CITRATE 20 MG PO TABS: ORAL_TABLET | ORAL | 0 refills | Status: DC

## 2017-09-22 NOTE — Progress Notes (Signed)
Edwin Jimenez is a 66 y.o. male who presents for welcome to Medicare visit and follow-up on chronic medical conditions.  He has the following concerns: He has been seen recently by Dr. Oneida Alar and is now being treated for a lower extremity injury.  He is having to limit his physical activities.  He continues to have difficulty with eczema.  He was also diagnosed with cataracts and is being followed by ophthalmology for this.  He also has rosacea.  He has a long history of lipomas that are quite visible.  There is a family history of heart disease and he is presently on a statin.  He does occasionally have difficulty with erectile dysfunction.  There is also family history of diabetes.   Immunizations and Health Maintenance Immunization History  Administered Date(s) Administered  . Hepatitis A 08/31/2002, 03/09/2003  . Hepatitis B 12/11/2006, 01/13/2007, 06/16/2007  . IPV 04/20/1999  . Influenza, High Dose Seasonal PF 08/04/2017  . Influenza,inj,Quad PF,6+ Mos 05/19/2014, 08/01/2015  . Influenza-Unspecified 07/30/2016  . Pneumococcal Conjugate-13 10/22/2016  . Td 12/09/2006  . Tdap 02/20/2011  . Typhoid Inactivated 02/20/2011  . Zoster 03/10/2013   Health Maintenance Due  Topic Date Due  . HIV Screening  09/24/1966    Last colonoscopy:2013Last PSA: Dentist:// Ophtho:? Exercise: Regular running  Other doctors caring for patient include: Danielle Dess  Advanced Directives: Does Patient Have a Medical Advance Directive?: Yes Type of Advance Directive: Living will Does patient want to make changes to medical advance directive?: Yes (ED - Information included in AVS)  Depression screen:  See questionnaire below.     Depression screen [facility] 2/9 09/22/2017 10/22/2016 01/31/2015 01/10/2015 01/10/2015  Decreased Interest 0 0 0 0 0  Down, Depressed, Hopeless 0 0 0 0 0  PHQ - 2 Score 0 0 0 0 0    Fall Screen: See Questionaire below.   Fall Risk  09/22/2017 10/22/2016 01/31/2015 01/10/2015  01/10/2015  Falls in the past year? No No No No No  Risk for fall due to : - - Other (Comment) Other (Comment) Other (Comment)    ADL screen:  See questionnaire below.  Functional Status Survey: Is the patient deaf or have difficulty hearing?: No Does the patient have difficulty seeing, even when wearing glasses/contacts?: Yes(wears glasses.) Does the patient have difficulty concentrating, remembering, or making decisions?: No Does the patient have difficulty walking or climbing stairs?: No Does the patient have difficulty dressing or bathing?: No Does the patient have difficulty doing errands alone such as visiting a doctor's office or shopping?: No   Review of Systems  Constitutional: -, -unexpected weight change, -anorexia, -fatigue Allergy: -sneezing, -itching, -congestion Dermatology: denies changing moles, rash, lumps ENT: -runny nose, -ear pain, -sore throat,  Cardiology:  -chest pain, -palpitations, -orthopnea, Respiratory: -cough, -shortness of breath, -dyspnea on exertion, -wheezing,  Gastroenterology: -abdominal pain, -nausea, -vomiting, -diarrhea, -constipation, -dysphagia Hematology: -bleeding or bruising problems Musculoskeletal: -arthralgias, -myalgias, -joint swelling, -back pain, - Ophthalmology: -vision changes,  Urology: -dysuria, -difficulty urinating,  -urinary frequency, -urgency, incontinence Neurology: -, -numbness, , -memory loss, -falls, -dizziness    PHYSICAL EXAM:  BP 120/72 (BP Location: Right Arm, Patient Position: Sitting)   Pulse (!) 55   Temp (!) 97.4 F (36.3 C) (Oral)   Ht 5\' 7"  (1.702 m)   Wt 133 lb (60.3 kg)   SpO2 98%   BMI 20.83 kg/m   General Appearance: Alert, cooperative, no distress, appears stated age Head: Normocephalic, without obvious abnormality, atraumatic.  Nose is  slightly erythematous from the rosacea Eyes: PERRL, conjunctiva/corneas clear, EOM's intact, fundi benign Ears: Normal TM's and external ear canals Nose: Nares  normal, mucosa normal, no drainage or sinus   tenderness Throat: Lips, mucosa, and tongue normal; teeth and gums normal Neck: Supple, no lymphadenopathy, thyroid:no enlargement/tenderness/nodules; no carotid bruit or JVD Lungs: Clear to auscultation bilaterally without wheezes, rales or ronchi; respirations unlabored Heart: Regular rate and rhythm, S1 and S2 normal, no murmur, rub or gallop Abdomen: Soft, non-tender, nondistended, normoactive bowel sounds, no masses, no hepatosplenomegaly Extremities: No clubbing, cyanosis or edema Pulses: 2+ and symmetric all extremities Skin: Multiple lipomas are noted on his arms as well as purpuric lesions.,  Lymph nodes: Cervical, supraclavicular, and axillary nodes normal Neurologic: CNII-XII intact, normal strength, sensation and gait; reflexes 2+ and symmetric throughout   Psych: Normal mood, affect, hygiene and grooming  ASSESSMENT/PLAN: Family history of ischemic heart disease (IHD) - Plan: CBC with Differential/Platelet, Comprehensive metabolic panel, Lipid panel  Senile purpura ([place]) - Plan: CBC with Differential/Platelet, Comprehensive metabolic panel  Rosacea - Plan: CBC with Differential/Platelet, Comprehensive metabolic panel  Eczema, unspecified type - Plan: CBC with Differential/Platelet, Comprehensive metabolic panel  Multiple lipomas  Erectile dysfunction, unspecified erectile dysfunction type - Plan: sildenafil (REVATIO) 20 MG tablet  Hyperlipidemia with target LDL less than 70 - Plan: Lipid panel  Family history of diabetes mellitus  Need for vaccination against Streptococcus pneumoniae - Plan: Pneumococcal polysaccharide vaccine 23-valent greater than or equal to 2yo subcutaneous/IM  Age-related cataract of both eyes, unspecified age-related cataract type He will continue on his present medication regimen as well as follow-up recommendations concerning his lower extremity.  No therapy needed for the lipomas.  Discussed the  senile purpura with him. Discussed treatment of the eczema and he is using emollients as well as antihistamines.      Medicare Attestation I have personally reviewed: The patient's medical and social history Their use of alcohol, tobacco or illicit drugs Their current medications and supplements The patient's functional ability including ADLs,fall risks, home safety risks, cognitive, and hearing and visual impairment Diet and physical activities Evidence for depression or mood disorders  The patient's weight, height, and BMI have been recorded in the chart.  I have made referrals, counseling, and provided education to the patient based on review of the above and I have provided the patient with a written personalized care plan for preventive services.     Jill Alexanders, MD   09/22/2017

## 2017-09-23 ENCOUNTER — Telehealth: Payer: Self-pay | Admitting: Family Medicine

## 2017-09-23 LAB — CBC WITH DIFFERENTIAL/PLATELET
Basophils Absolute: 51 cells/uL (ref 0–200)
Basophils Relative: 1.3 %
Eosinophils Absolute: 62 cells/uL (ref 15–500)
Eosinophils Relative: 1.6 %
HCT: 47.4 % (ref 38.5–50.0)
Hemoglobin: 16.3 g/dL (ref 13.2–17.1)
Lymphs Abs: 1583 cells/uL (ref 850–3900)
MCH: 32.2 pg (ref 27.0–33.0)
MCHC: 34.4 g/dL (ref 32.0–36.0)
MCV: 93.7 fL (ref 80.0–100.0)
MPV: 11.1 fL (ref 7.5–12.5)
Monocytes Relative: 9.6 %
Neutro Abs: 1829 cells/uL (ref 1500–7800)
Neutrophils Relative %: 46.9 %
Platelets: 175 10*3/uL (ref 140–400)
RBC: 5.06 10*6/uL (ref 4.20–5.80)
RDW: 12 % (ref 11.0–15.0)
Total Lymphocyte: 40.6 %
WBC mixed population: 374 cells/uL (ref 200–950)
WBC: 3.9 10*3/uL (ref 3.8–10.8)

## 2017-09-23 LAB — COMPREHENSIVE METABOLIC PANEL
AG Ratio: 1.8 (calc) (ref 1.0–2.5)
ALT: 26 U/L (ref 9–46)
AST: 29 U/L (ref 10–35)
Albumin: 4.4 g/dL (ref 3.6–5.1)
Alkaline phosphatase (APISO): 52 U/L (ref 40–115)
BUN: 20 mg/dL (ref 7–25)
CO2: 28 mmol/L (ref 20–32)
Calcium: 9.4 mg/dL (ref 8.6–10.3)
Chloride: 105 mmol/L (ref 98–110)
Creat: 0.9 mg/dL (ref 0.70–1.25)
Globulin: 2.5 g/dL (calc) (ref 1.9–3.7)
Glucose, Bld: 93 mg/dL (ref 65–99)
Potassium: 4.5 mmol/L (ref 3.5–5.3)
Sodium: 141 mmol/L (ref 135–146)
Total Bilirubin: 0.9 mg/dL (ref 0.2–1.2)
Total Protein: 6.9 g/dL (ref 6.1–8.1)

## 2017-09-23 LAB — LIPID PANEL
Cholesterol: 138 mg/dL (ref <200)
HDL: 63 mg/dL (ref >40)
LDL Cholesterol (Calc): 62 mg/dL (calc)
Non-HDL Cholesterol (Calc): 75 mg/dL (calc) (ref <130)
Total CHOL/HDL Ratio: 2.2 (calc) (ref <5.0)
Triglycerides: 45 mg/dL (ref <150)

## 2017-09-23 NOTE — Telephone Encounter (Signed)
Pt called & states refills go to [facility]

## 2017-10-21 ENCOUNTER — Ambulatory Visit (INDEPENDENT_AMBULATORY_CARE_PROVIDER_SITE_OTHER): Payer: Medicare Other | Admitting: Sports Medicine

## 2017-10-21 ENCOUNTER — Encounter: Payer: Self-pay | Admitting: Sports Medicine

## 2017-10-21 DIAGNOSIS — S86112D Strain of other muscle(s) and tendon(s) of posterior muscle group at lower leg level, left leg, subsequent encounter: Secondary | ICD-10-CM | POA: Diagnosis present

## 2017-10-21 DIAGNOSIS — S86819D Strain of other muscle(s) and tendon(s) at lower leg level, unspecified leg, subsequent encounter: Secondary | ICD-10-CM | POA: Diagnosis not present

## 2017-10-21 NOTE — Progress Notes (Signed)
CC: Left calf strain  Patient with deep calf pain on left after a long run Found with swelling and disruption in soleus Using alfredson exercises and heel lifts with sports insoles Much improved - 75% Has run 6 straight days with no significant pain Long run up to 6 miles now  ROS No new swelling Still slight bruising but minimal No night pain  PE Thin, WM in NAD BP 104/68   Ht 5' 7.5" (1.715 m)   Wt 133 lb (60.3 kg)   BMI 20.52 kg/m   Normal calf appearance x mild discoloration along medial distal 1/3 of calf Localized area of TTP deep in distal calf No TTP at medial head or lat head gastroc No defects noted Good strength No limp  [place] of Left Calf Medial gastrocnemius shows some hyperechoic scar tissue with no swelling This is near medial head insertion to fascia Involves 10% of distal gastrocnemius  Soleus shows hypoechoic change surrounding muscle area 2 cm distal to medial head of gastrocnemius LAX view shows this is within fascial plane between bundles of soleus muscle The amount of fluid is reduced by > 50%  Impression: continued healing of structural damage to distal soleus  Ultrasound and interpretation by Wolfgang Phoenix. Oneida Alar, MD

## 2017-10-21 NOTE — Assessment & Plan Note (Signed)
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

## 2017-10-23 DIAGNOSIS — H2513 Age-related nuclear cataract, bilateral: Secondary | ICD-10-CM | POA: Diagnosis not present

## 2017-11-06 DIAGNOSIS — H25811 Combined forms of age-related cataract, right eye: Secondary | ICD-10-CM | POA: Diagnosis not present

## 2017-11-06 DIAGNOSIS — H25812 Combined forms of age-related cataract, left eye: Secondary | ICD-10-CM | POA: Diagnosis not present

## 2017-11-06 DIAGNOSIS — H04123 Dry eye syndrome of bilateral lacrimal glands: Secondary | ICD-10-CM | POA: Diagnosis not present

## 2017-11-06 DIAGNOSIS — L719 Rosacea, unspecified: Secondary | ICD-10-CM | POA: Diagnosis not present

## 2017-11-10 ENCOUNTER — Other Ambulatory Visit: Payer: Self-pay | Admitting: Family Medicine

## 2017-12-15 DIAGNOSIS — K219 Gastro-esophageal reflux disease without esophagitis: Secondary | ICD-10-CM | POA: Diagnosis not present

## 2017-12-15 DIAGNOSIS — R740 Nonspecific elevation of levels of transaminase and lactic acid dehydrogenase [LDH]: Secondary | ICD-10-CM | POA: Diagnosis not present

## 2018-01-05 DIAGNOSIS — H25812 Combined forms of age-related cataract, left eye: Secondary | ICD-10-CM | POA: Diagnosis not present

## 2018-01-05 DIAGNOSIS — Z01818 Encounter for other preprocedural examination: Secondary | ICD-10-CM | POA: Diagnosis not present

## 2018-01-12 DIAGNOSIS — K219 Gastro-esophageal reflux disease without esophagitis: Secondary | ICD-10-CM | POA: Diagnosis not present

## 2018-01-15 DIAGNOSIS — H25812 Combined forms of age-related cataract, left eye: Secondary | ICD-10-CM | POA: Diagnosis not present

## 2018-01-15 DIAGNOSIS — H2512 Age-related nuclear cataract, left eye: Secondary | ICD-10-CM | POA: Diagnosis not present

## 2018-01-26 ENCOUNTER — Ambulatory Visit (INDEPENDENT_AMBULATORY_CARE_PROVIDER_SITE_OTHER): Payer: Medicare Other | Admitting: Sports Medicine

## 2018-01-26 ENCOUNTER — Ambulatory Visit
Admission: RE | Admit: 2018-01-26 | Discharge: 2018-01-26 | Disposition: A | Discharge status: Home / Self Care | Payer: Medicare Other | Source: Ambulatory Visit | Attending: Sports Medicine | Admitting: Sports Medicine

## 2018-01-26 VITALS — BP 120/72 | Ht 67.5 in | Wt 135.0 lb

## 2018-01-26 DIAGNOSIS — M25551 Pain in right hip: Secondary | ICD-10-CM

## 2018-01-27 ENCOUNTER — Encounter: Payer: Self-pay | Admitting: Sports Medicine

## 2018-01-27 ENCOUNTER — Other Ambulatory Visit: Payer: Self-pay | Admitting: *Deleted

## 2018-01-27 MED ORDER — MELOXICAM 15 MG PO TABS: ORAL_TABLET | ORAL | 0 refills | Status: DC

## 2018-01-27 NOTE — Progress Notes (Signed)
   Subjective:    Patient ID: Edwin Jimenez, male    DOB: 1951/01/14, 67 y.o.   MRN: [id number]  HPI chief complaint: Right hip pain  Edwin Jimenez comes in today complaining of one week of right hip pain. Pain began acutely while running. He has been unable to return to running without pain. Pain is diffuse around the hip. He denies any specific injury leading up to his pain. He does have a history of a prior adductor muscle injury remotely but his current pain is different in nature than what he experienced with that injury. He describes his discomfort as achy. His hip will get stiff if he sits for long periods of time. Pain will occasionally radiate down into his thigh. No numbness or tingling. No low back pain.  Interim medical history reviewed Medications reviewed Allergies reviewed    Review of Systems    as above Objective:   Physical Exam  Well-developed, well-nourished. No acute distress. Awake alert and oriented 3. Vital signs reviewed  Right hip: Patient has good passive and active range of motion. No tenderness over the greater trochanteric bursa. No pain with resisted hip flexion. Good strength. Neurovascular intact distally.  X-rays of the right hip show no significant degenerative changes other than some incidental degenerative changes at the pubic symphysis. Nothing acute is seen.      Assessment & Plan:   Right hip pain secondary to synovitis versus occult osteoarthritis  Patient's symptoms sound intra-articular. X-ray is reassuring. Meloxicam 15 mg daily for 7 days then as needed. Patient may need to cross train for the next week or so but I will allow him to return to running as his pain allows. He will follow-up with me next week via telephone or email with an update on his progress. If symptoms persist, further diagnostic imaging may be necessary.

## 2018-02-01 ENCOUNTER — Encounter: Payer: Self-pay | Admitting: Sports Medicine

## 2018-02-09 ENCOUNTER — Encounter: Payer: Self-pay | Admitting: Sports Medicine

## 2018-02-24 ENCOUNTER — Encounter: Payer: Self-pay | Admitting: Sports Medicine

## 2018-04-06 DIAGNOSIS — J8410 Pulmonary fibrosis, unspecified: Secondary | ICD-10-CM | POA: Diagnosis not present

## 2018-04-06 DIAGNOSIS — E785 Hyperlipidemia, unspecified: Secondary | ICD-10-CM | POA: Diagnosis not present

## 2018-04-06 DIAGNOSIS — R079 Chest pain, unspecified: Secondary | ICD-10-CM | POA: Diagnosis not present

## 2018-04-06 DIAGNOSIS — R911 Solitary pulmonary nodule: Secondary | ICD-10-CM | POA: Diagnosis not present

## 2018-04-06 DIAGNOSIS — R918 Other nonspecific abnormal finding of lung field: Secondary | ICD-10-CM | POA: Diagnosis not present

## 2018-04-07 ENCOUNTER — Other Ambulatory Visit: Payer: Self-pay | Admitting: Family Medicine

## 2018-04-07 DIAGNOSIS — Z8249 Family history of ischemic heart disease and other diseases of the circulatory system: Secondary | ICD-10-CM

## 2018-04-07 DIAGNOSIS — E785 Hyperlipidemia, unspecified: Secondary | ICD-10-CM

## 2018-04-07 NOTE — Progress Notes (Signed)
He was recently seen in the emergency room in [facility] for evaluation of midsternal chest pain while running.  Apparently the evaluation was negative but follow-up cardiology/treadmill was recommended.  He states that he had the discomfort while running and it did not keep him from continuing to run.

## 2018-04-30 ENCOUNTER — Encounter: Payer: Self-pay | Admitting: Physician Assistant

## 2018-04-30 ENCOUNTER — Ambulatory Visit (INDEPENDENT_AMBULATORY_CARE_PROVIDER_SITE_OTHER): Payer: Medicare Other | Admitting: Physician Assistant

## 2018-04-30 VITALS — BP 124/70 | HR 50 | Ht 67.5 in | Wt 134.0 lb

## 2018-04-30 DIAGNOSIS — Z8249 Family history of ischemic heart disease and other diseases of the circulatory system: Secondary | ICD-10-CM

## 2018-04-30 DIAGNOSIS — R079 Chest pain, unspecified: Secondary | ICD-10-CM | POA: Diagnosis not present

## 2018-04-30 DIAGNOSIS — R001 Bradycardia, unspecified: Secondary | ICD-10-CM

## 2018-04-30 DIAGNOSIS — R911 Solitary pulmonary nodule: Secondary | ICD-10-CM | POA: Diagnosis not present

## 2018-04-30 DIAGNOSIS — R011 Cardiac murmur, unspecified: Secondary | ICD-10-CM | POA: Diagnosis not present

## 2018-04-30 NOTE — Patient Instructions (Signed)
Medication Instructions:  Your physician recommends that you continue on your current medications as directed. Please refer to the Current Medication list given to you today.  Labwork: NONE   Testing/Procedures: Your physician has requested that you have an echocardiogram. Echocardiography is a painless test that uses sound waves to create images of your heart. It provides your doctor with information about the size and shape of your heart and how well your heart's chambers and valves are working. This procedure takes approximately one hour. There are no restrictions for this procedure. [facility] recommends that you schedule a follow-up appointment in: [place].  Any Other Special Instructions Will Be Listed Below (If Applicable). If you need a refill on your cardiac medications before your next appointment, please call your pharmacy.

## 2018-04-30 NOTE — Progress Notes (Signed)
Cardiology Office Note    Date:  05/01/2018   ID:  Edwin Jimenez, DOB 03-Dec-1950, MRN [id number]  PCP:  Denita Lung, MD  Cardiologist:  New - case discussed with Dr. Harrell Gave, previously patient of Dr. Rex Kras who retired.  Chief Complaint  Patient presents with  . New Patient (Initial Visit)    was seen years back, had chest burning at beach, went to ER nothing was found, waited to be seen when he returned, denies chest pains now    History of Present Illness:  Edwin Jimenez is a 67 y.o. male with PMH of calcified lung nodule (R base, likely granuloma by CT in [name]) and HLD.  Patient is a long-distance runner and usually runs at least 5 miles a day, sometimes up to 50 miles a week.  He denies any previous chest pain or shortness of breath.  He had a cardiac evaluation in [place] and underwent stress echo and POET around 2000 which was normal.  He says right before he went to the ED, he took 2 pills and subsequently had a burning sensation in his chest.  Troponin obtained in [facility] ED was negative.  Chest x-ray was concerning for a right lower lobe nodule measuring 1 cm.  Subsequent CT angiogram of the chest was negative for PE, right lower lobe nodule was calcified and likely granuloma.  Since his discharge, he had one more episode of chest burning sensation, this also occurred immediately after he took his pills.  However he continued to run to this day and able to run at least 5 miles on a daily basis without any issue.  The last time he run was yesterday morning.  He did not have any exertional chest pain or exertional shortness of breath.  His EKG when compared to the 2018 EKG does show the resolution of T wave inversion in lead I and aVL, however has new T wave inversion in lead III and aVF.    He presents today for cardiology evaluation.  On physical exam, he also noticed to have a continuous murmur near the right upper sternal border.  I am not entirely sure if this  represent atrial septal defect or patent ductus arteriosus.  I will obtain an echocardiogram to further assess.  Interestingly, he does not have any obvious dyspnea on exertion to suggest structural heart disease.  His chest discomfort is fairly atypical, given the fact that he can continue to exercise, if echocardiogram shows normal ejection fraction without wall motion, I would not recommend any further ischemic work-up.  The case has been discussed with DOD Dr. Harrell Gave who also agrees.    Past Medical History:  Diagnosis Date  . Dyslipidemia   . ED (erectile dysfunction)   . Hyperlipidemia     Past Surgical History:  Procedure Laterality Date  . COLONOSCOPY     x2  . HERNIA REPAIR  06/16/12   LIH  . INGUINAL HERNIA REPAIR  06/16/2012   Procedure: HERNIA REPAIR INGUINAL ADULT;  Surgeon: Pedro Earls, MD;  Location: [facility] [place];  Service: General;  Laterality: Left;  left inguinal hernia repair with mesh    Current Medications: Outpatient Medications Prior to Visit  Medication Sig Dispense Refill  . atorvastatin (LIPITOR) 20 MG tablet TAKE 1 TABLET DAILY 90 tablet 3  . Azelaic Acid (FINACEA) 15 % cream Apply topically 2 (two) times daily. After skin is thoroughly washed and patted dry, gently but thoroughly massage a  thin film of azelaic acid cream into the affected area twice daily, in the morning and evening.    Marland Kitchen co-enzyme Q-10 30 MG capsule Take 200 mg by mouth daily.     . fish oil-omega-3 fatty acids 1000 MG capsule Take 2 g by mouth daily.      . halobetasol (ULTRAVATE) 0.05 % cream Apply topically 2 (two) times daily.    . sildenafil (REVATIO) 20 MG tablet Use between 1-5 pills as needed daily 20 tablet 0  . meloxicam (MOBIC) 15 MG tablet Take one a day with food for seven days then as needed (Patient not taking: Reported on 04/30/2018) 40 tablet 0   No facility-administered medications prior to visit.      Allergies:   Patient has no known allergies.    Social History   Socioeconomic History  . Marital status: Married    Spouse name: Not on file  . Number of children: Not on file  . Years of education: Not on file  . Highest education level: Not on file  Occupational History  . Not on file  Social Needs  . Financial resource strain: Not on file  . Food insecurity:    Worry: Not on file    Inability: Not on file  . Transportation needs:    Medical: Not on file    Non-medical: Not on file  Tobacco Use  . Smoking status: Never Smoker  . Smokeless tobacco: Never Used  Substance and Sexual Activity  . Alcohol use: Yes    Comment: wine of weekends  . Drug use: No  . Sexual activity: Yes  Lifestyle  . Physical activity:    Days per week: Not on file    Minutes per session: Not on file  . Stress: Not on file  Relationships  . Social connections:    Talks on phone: Not on file    Gets together: Not on file    Attends religious service: Not on file    Active member of club or organization: Not on file    Attends meetings of clubs or organizations: Not on file    Relationship status: Not on file  Other Topics Concern  . Not on file  Social History Narrative  . Not on file     Family History:  The patient's family history includes Diabetes in his sister; Heart disease (age of onset: 49) in his father; Stroke in his mother; Transient ischemic attack in his father.   ROS:   Please see the history of present illness.    ROS All other systems reviewed and are negative.   PHYSICAL EXAM:   VS:  BP 124/70 (BP Location: Left Arm, Patient Position: Sitting)   Pulse (!) 50   Ht 5' 7.5" (1.715 m)   Wt 134 lb (60.8 kg)   BMI 20.68 kg/m    GEN: Well nourished, well developed, in no acute distress  HEENT: normal  Neck: no JVD, carotid bruits, or masses Cardiac: RRR; no rubs, or gallops,no edema  Persistent murmur throughout both systolic and diastolic face near the right upper sternal border. Respiratory:  clear to  auscultation bilaterally, normal work of breathing GI: soft, nontender, nondistended, + BS MS: no deformity or atrophy  Skin: warm and dry, no rash Neuro:  Alert and Oriented x 3, Strength and sensation are intact Psych: euthymic mood, full affect  Wt Readings from Last 3 Encounters:  04/30/18 134 lb (60.8 kg)  01/26/18 135 lb (61.2 kg)  10/21/17 133 lb (60.3 kg)      Studies/Labs Reviewed:   EKG:  EKG is ordered today.  The ekg ordered today demonstrates sinus bradycardia, resolved T wave inversion in lead I and aVL 1 compared to the previous EKG, new T wave inversion in lead III and aVF.  Recent Labs: 09/22/2017: ALT 26; BUN 20; Creat 0.90; Hemoglobin 16.3; Platelets 175; Potassium 4.5; Sodium 141   Lipid Panel    Component Value Date/Time   CHOL 138 09/22/2017 1111   TRIG 45 09/22/2017 1111   HDL 63 09/22/2017 1111   CHOLHDL 2.2 09/22/2017 1111   VLDL 8 10/22/2016 0943   LDLCALC 62 09/22/2017 1111    Additional studies/ records that were reviewed today include:   Records from [facility] ED CT of chest obtained at [facility] ED showed no evidence of PE, right lower lobe nodule likely calcified granuloma Troponin negative    ASSESSMENT:    1. Heart murmur   2. Family history of ischemic heart disease (IHD)   3. Chest pain, unspecified type   4. Lung nodule   5. Bradycardia      PLAN:  In order of problems listed above:  1. Heart murmur: Interesting finding, he has a persistent murmur throughout the entire systolic and diastolic phase near the right upper border of the sternum.  Possible diagnoses include atrial septal defect, patent ductus arteriosus, ruptured sinus of Valsalva aneurysm, etc. I recommended echocardiogram for now.  Recent CT angiogram of the chest did not mention any obvious vascular issue.  He also does not have any symptom of dyspnea on exertion despite being a long-distance runner  2. Chest pain: His father has heart disease at age 47.   Otherwise, he has no obvious cardiac risk factor other than age.  So far he only had 2 episodes of chest pain, both occurred right after he took some pill, he described the sensation as a burning sensation.  Symptom sounds more GI in nature consistent with acid reflux.  Since he was ruled out of MI by enzyme in [facility] ED, he continued to run at least 5 miles a day.  The last time he runs that distance was yesterday morning and that he did not have any exertional chest discomfort.  Given lack of symptom, we will hold off on ischemic work-up.  3. Relative bradycardia: Consistent with his active lifestyle.  He runs about 5 miles per day and up to 50 miles per week.  4. Lung nodule: Initially was seen on the chest x-ray done in [facility] ED, follow-up CT angiogram of the chest did not show any PE, right lower lobe nodule consistent with calcified granuloma.    Medication Adjustments/Labs and Tests Ordered: Current medicines are reviewed at length with the patient today.  Concerns regarding medicines are outlined above.  Medication changes, Labs and Tests ordered today are listed in the Patient Instructions below. Patient Instructions  Medication Instructions:  Your physician recommends that you continue on your current medications as directed. Please refer to the Current Medication list given to you today.  Labwork: NONE   Testing/Procedures: Your physician has requested that you have an echocardiogram. Echocardiography is a painless test that uses sound waves to create images of your heart. It provides your doctor with information about the size and shape of your heart and how well your heart's chambers and valves are working. This procedure takes approximately one hour. There are no restrictions for this procedure. [place]  [address]  Follow-Up: Your PROVIDER recommends that you schedule a follow-up appointment in: [place].  Any Other Special Instructions Will  Be Listed Below (If Applicable). If you need a refill on your cardiac medications before your next appointment, please call your pharmacy.     Hilbert Corrigan, [place]  05/01/2018 12:03 AM    [address], [place], [place]  [address] Phone: [phone]; Fax: [phone]

## 2018-05-04 ENCOUNTER — Other Ambulatory Visit: Payer: Self-pay

## 2018-05-04 ENCOUNTER — Ambulatory Visit (HOSPITAL_COMMUNITY): Payer: Medicare Other | Attending: Internal Medicine

## 2018-05-04 DIAGNOSIS — E785 Hyperlipidemia, unspecified: Secondary | ICD-10-CM | POA: Insufficient documentation

## 2018-05-04 DIAGNOSIS — I10 Essential (primary) hypertension: Secondary | ICD-10-CM | POA: Insufficient documentation

## 2018-05-04 DIAGNOSIS — R011 Cardiac murmur, unspecified: Secondary | ICD-10-CM | POA: Diagnosis not present

## 2018-05-04 DIAGNOSIS — Z8249 Family history of ischemic heart disease and other diseases of the circulatory system: Secondary | ICD-10-CM | POA: Diagnosis not present

## 2018-05-05 ENCOUNTER — Encounter: Payer: Self-pay | Admitting: Physician Assistant

## 2018-05-05 NOTE — Telephone Encounter (Signed)
Spoke with the patient and gave him his echo results per his my chart request.

## 2018-07-20 ENCOUNTER — Encounter: Payer: Self-pay | Admitting: Family Medicine

## 2018-07-20 ENCOUNTER — Ambulatory Visit (INDEPENDENT_AMBULATORY_CARE_PROVIDER_SITE_OTHER): Payer: Medicare Other | Admitting: Family Medicine

## 2018-07-20 VITALS — BP 132/84 | HR 57 | Temp 97.4°F | Wt 132.6 lb

## 2018-07-20 DIAGNOSIS — N529 Male erectile dysfunction, unspecified: Secondary | ICD-10-CM

## 2018-07-20 DIAGNOSIS — R21 Rash and other nonspecific skin eruption: Secondary | ICD-10-CM

## 2018-07-20 MED ORDER — SILDENAFIL CITRATE 20 MG PO TABS: ORAL_TABLET | ORAL | 5 refills | Status: DC

## 2018-07-20 NOTE — Progress Notes (Signed)
   Subjective:    Patient ID: Edwin Jimenez, male    DOB: 1950/10/10, 67 y.o.   MRN: [id number]  HPI He is here for evaluation of a rash to the left submandibular area.  It started about 1 week ago.  He has itched but does not cause a lot of pain.  He does not note any pain or discomfort in the nerve root pattern distribution. He would also like a refill on his sildenafil.   Review of Systems     Objective:   Physical Exam Alert and in no distress.  Healing erythematous area is noted in the left submandibular area.  There is no dysesthesia in the C3 nerve root pattern.       Assessment & Plan:  Erectile dysfunction, unspecified erectile dysfunction type - Plan: sildenafil (REVATIO) 20 MG tablet  Rash He does not have the signs and symptoms that I would call shingles but certainly it is a possibility.  At this point since its one-week down the road, no further intervention needed.  He was comfortable with that. Prescription written for sildenafil.

## 2018-07-27 ENCOUNTER — Other Ambulatory Visit (INDEPENDENT_AMBULATORY_CARE_PROVIDER_SITE_OTHER): Payer: Medicare Other

## 2018-07-27 DIAGNOSIS — Z23 Encounter for immunization: Secondary | ICD-10-CM | POA: Diagnosis not present

## 2018-07-28 ENCOUNTER — Ambulatory Visit (INDEPENDENT_AMBULATORY_CARE_PROVIDER_SITE_OTHER): Payer: Medicare Other | Admitting: Cardiology

## 2018-07-28 ENCOUNTER — Encounter: Payer: Self-pay | Admitting: Cardiology

## 2018-07-28 DIAGNOSIS — Z7189 Other specified counseling: Secondary | ICD-10-CM | POA: Diagnosis not present

## 2018-07-28 DIAGNOSIS — Z8249 Family history of ischemic heart disease and other diseases of the circulatory system: Secondary | ICD-10-CM | POA: Diagnosis not present

## 2018-07-28 DIAGNOSIS — E782 Mixed hyperlipidemia: Secondary | ICD-10-CM

## 2018-07-28 DIAGNOSIS — R011 Cardiac murmur, unspecified: Secondary | ICD-10-CM | POA: Insufficient documentation

## 2018-07-28 NOTE — Patient Instructions (Signed)
Medication Instructions:  Your Physician recommend you continue on your current medication as directed.    If you need a refill on your cardiac medications before your next appointment, please call your pharmacy.   Lab work: None   Testing/Procedures: Calcium Score  [address]  Follow-Up: At [facility], you and your health needs are our priority.  As part of our continuing mission to provide you with exceptional heart care, we have created designated Provider Care Teams.  These Care Teams include your primary Cardiologist (physician) and Advanced Practice Providers (APPs -  Physician Assistants and Nurse Practitioners) who all work together to provide you with the care you need, when you need it. You will need a follow up appointment in 1 years.  Please call our office 2 months in advance to schedule this appointment.  You may see Dr. Harrell Gave or one of the following Advanced Practice Providers on your designated Care Team:   Rosaria Ferries, PA-C . Jory Sims, DNP, ANP  Any Other Special Instructions Will Be Listed Below (If Applicable).

## 2018-07-28 NOTE — Progress Notes (Signed)
Cardiology Office Note:    Date:  07/28/2018   ID:  Edwin Jimenez Edwin Jimenez, DOB 12-Sep-1951, MRN [id number]  PCP:  Denita Lung, MD  Cardiologist:  Buford Dresser, MD PhD  Referring MD: Denita Lung, MD   CC: follow up  History of Present Illness:    Edwin Jimenez is a 67 y.o. male with a hx of hyperlipidemia, family history of CAD who is seen as a follow up at the request of Denita Lung, MD for the evaluation and management of murmur and prevention strategy. Last visit with Almyra Deforest on 04/30/18.  Ran a race this weekend (one he had run before) and was 7 minutes slower. Hadn't trained as hard as previously, but worries what is a sign of aging/less training and what is a limitation from a heart perspective. Also had some hematochezia with bowel movement during the race, pending GI evaluation for this. No chest pain. Sometimes feels like his heart races initially when he runs, but then settles out. Does feel winded when running uphill, but not an unexpected amount.  Reviewed the echocardiogram results. We discussed his personal risk factors, history, and management of CAD today. See below.  Past Medical History:  Diagnosis Date  . Dyslipidemia   . ED (erectile dysfunction)   . Hyperlipidemia     Past Surgical History:  Procedure Laterality Date  . COLONOSCOPY     x2  . HERNIA REPAIR  06/16/12   [facility]  . INGUINAL HERNIA REPAIR  06/16/2012   Procedure: HERNIA REPAIR INGUINAL ADULT;  Surgeon: Pedro Earls, MD;  Location: [facility];  Service: General;  Laterality: Left;  left inguinal hernia repair with mesh    Current Medications: Current Outpatient Medications on File Prior to Visit  Medication Sig  . atorvastatin (LIPITOR) 20 MG tablet TAKE 1 TABLET DAILY  . augmented betamethasone dipropionate (DIPROLENE-AF) 0.05 % cream Apply 1 application topically as needed.  . Azelaic Acid (FINACEA) 15 % cream Apply topically 2 (two) times daily. After skin is  thoroughly washed and patted dry, gently but thoroughly massage a thin film of azelaic acid cream into the affected area twice daily, in the morning and evening.  . Coenzyme Q10 (CO Q10) 200 MG CAPS Take 1 capsule by mouth daily.  . halobetasol (ULTRAVATE) 0.05 % cream Apply topically 2 (two) times daily.  . sildenafil (REVATIO) 20 MG tablet Use between 1-5 pills as needed daily  . fish oil-omega-3 fatty acids 1000 MG capsule Take 2 g by mouth daily.     No current facility-administered medications on file prior to visit.      Allergies:   Patient has no known allergies.   Social History   Socioeconomic History  . Marital status: Married    Spouse name: Not on file  . Number of children: Not on file  . Years of education: Not on file  . Highest education level: Not on file  Occupational History  . Not on file  Social Needs  . Financial resource strain: Not on file  . Food insecurity:    Worry: Not on file    Inability: Not on file  . Transportation needs:    Medical: Not on file    Non-medical: Not on file  Tobacco Use  . Smoking status: Never Smoker  . Smokeless tobacco: Never Used  Substance and Sexual Activity  . Alcohol use: Yes    Comment: wine of weekends  . Drug use: No  .  Sexual activity: Yes  Lifestyle  . Physical activity:    Days per week: Not on file    Minutes per session: Not on file  . Stress: Not on file  Relationships  . Social connections:    Talks on phone: Not on file    Gets together: Not on file    Attends religious service: Not on file    Active member of club or organization: Not on file    Attends meetings of clubs or organizations: Not on file    Relationship status: Not on file  Other Topics Concern  . Not on file  Social History Narrative  . Not on file     Family History: The patient's family history includes Diabetes in his sister; Heart disease (age of onset: 64) in his father; Stroke in his mother; Transient ischemic attack in  his father. There is no history of Hypertension. All of the men on his father's side died of Mis in adulthood. No early onset CAD.   ROS:   Please see the history of present illness.  Additional pertinent ROS:  Constitutional: Negative for chills, fever, night sweats, unintentional weight loss  HENT: Negative for ear pain and hearing loss.   Eyes: Negative for loss of vision and eye pain.  Respiratory: Negative for cough, sputum, shortness of breath, wheezing.   Cardiovascular: Negative for chest pain, palpitations , PND, orthopnea, lower extremity edema and claudication.  Gastrointestinal: Negative for abdominal pain, melena. Positive for hematochezia.  Genitourinary: Negative for dysuria and hematuria.  Musculoskeletal: Negative for falls and myalgias.  Skin: Negative for itching and rash.  Neurological: Negative for focal weakness, focal sensory changes and loss of consciousness.  Endo/Heme/Allergies: Does bruise/bleed easily when on aspirin.    EKGs/Labs/Other Studies Reviewed:    The following studies were reviewed today: Echo 7.29.19 Study Conclusions  - Left ventricle: The cavity size was normal. Wall thickness was   normal. Systolic function was normal. The estimated ejection   fraction was in the range of 55% to 60%. Wall motion was normal;   there were no regional wall motion abnormalities. Doppler   parameters are consistent with abnormal left ventricular   relaxation (grade 1 diastolic dysfunction).  Impressions:  - Normal LV systolic function; mild diastolic dysfunction;   sclerotic aortic valve.  Reviewed [facility] as well.   EKG:  The ekg ordered previously demonstrates NSR, TWI in III with flat T waves in II/aVF  Recent Labs: 09/22/2017: ALT 26; BUN 20; Creat 0.90; Hemoglobin 16.3; Platelets 175; Potassium 4.5; Sodium 141  Recent Lipid Panel    Component Value Date/Time   CHOL 138 09/22/2017 1111   TRIG 45 09/22/2017 1111   HDL  63 09/22/2017 1111   CHOLHDL 2.2 09/22/2017 1111   VLDL 8 10/22/2016 0943   LDLCALC 62 09/22/2017 1111    Physical Exam:    VS:  Wt 134.4, BP 124/70, HR 60    Wt Readings from Last 3 Encounters:  07/20/18 132 lb 9.6 oz (60.1 kg)  04/30/18 134 lb (60.8 kg)  01/26/18 135 lb (61.2 kg)     GEN: Well nourished, well developed in no acute distress HEENT: Normal NECK: No JVD; No carotid bruits LYMPHATICS: No lymphadenopathy CARDIAC: regular rhythm, normal S1 and S2, no rubs, gallops. Radial and DP pulses 2+ bilaterally. Continuous flow murmur best heard at upper sternal border, audible throughout precordium RESPIRATORY:  Clear to auscultation without rales, wheezing or rhonchi  ABDOMEN: Soft, non-tender, non-distended  MUSCULOSKELETAL:  No edema; No deformity  SKIN: Warm and dry NEUROLOGIC:  Alert and oriented x 3 PSYCHIATRIC:  Normal affect   ASSESSMENT:    1. Family history of ischemic heart disease (IHD)   2. Mixed hyperlipidemia   3. Counseling on health promotion and disease prevention   4. Flow murmur    PLAN:    1. Flow murmur: not consistent with the aortic sclerosis seen on echo. Not machinelike, more of a flow murmur. I personally reviewed his echo images. No evidence of ASD/VSD. No flow acceleration for PA stenosis or coarc. No evidence of fistula. Given location, may be venous hum or AV flow, though cannot definitively say. No evidence for increased pressure/flow in the heart.  2. Prevention: personal history of hyperlipidemia, family history of CAD. ASCVD risk score below. Discussed adjunctive prevention stratification options, including hs-CRP and calcium score. Discussed method of testing and the information it provides. He wishes to pursue CT calcium score. I cannot see his images from [facility], but no mention of coronary calcium. However, this does not definitively exclude it.   His LDL is excellent on atorvastatin and fish oil. He is pursuing an optimum  lifestyle. If there is any coronary calcium, will likely need to increase intensity of medical therapy.   The 10-year ASCVD risk score Mikey Bussing DC Brooke Bonito., et al., 2013) is: 10%   Values used to calculate the score:     Age: 89 years     Sex: Male     Is Non-Hispanic African American: No     Diabetic: No     Tobacco smoker: No     Systolic Blood Pressure: 381 mmHg     Is BP treated: No     HDL Cholesterol: 63 mg/dL     Total Cholesterol: 138 mg/dL  Plan for follow up: follow up results of calcium score, appointment in 1 year or sooner PRN  Medication Adjustments/Labs and Tests Ordered: Current medicines are reviewed at length with the patient today.  Concerns regarding medicines are outlined above.  Orders Placed This Encounter  Procedures  . CT CARDIAC SCORING   No orders of the defined types were placed in this encounter.   Patient Instructions  Medication Instructions:  Your Physician recommend you continue on your current medication as directed.    If you need a refill on your cardiac medications before your next appointment, please call your pharmacy.   Lab work: None   Testing/Procedures: Calcium Score  [address]  Follow-Up: At [facility], you and your health needs are our priority.  As part of our continuing mission to provide you with exceptional heart care, we have created designated Provider Care Teams.  These Care Teams include your primary Cardiologist (physician) and Advanced Practice Providers (APPs -  Physician Assistants and Nurse Practitioners) who all work together to provide you with the care you need, when you need it. You will need a follow up appointment in 1 years.  Please call our office 2 months in advance to schedule this appointment.  You may see Dr. Harrell Gave or one of the following Advanced Practice Providers on your designated Care Team:   Rosaria Ferries, PA-C . Jory Sims, DNP, ANP  Any Other Special Instructions  Will Be Listed Below (If Applicable).        Signed, Buford Dresser, MD PhD 07/28/2018 12:07 PM    [place]

## 2018-07-29 DIAGNOSIS — K625 Hemorrhage of anus and rectum: Secondary | ICD-10-CM | POA: Diagnosis not present

## 2018-07-29 DIAGNOSIS — R197 Diarrhea, unspecified: Secondary | ICD-10-CM | POA: Diagnosis not present

## 2018-08-11 ENCOUNTER — Ambulatory Visit (INDEPENDENT_AMBULATORY_CARE_PROVIDER_SITE_OTHER)
Admission: RE | Admit: 2018-08-11 | Discharge: 2018-08-11 | Disposition: A | Discharge status: Home / Self Care | Payer: Self-pay | Source: Ambulatory Visit | Attending: Cardiology | Admitting: Cardiology

## 2018-08-11 DIAGNOSIS — Z8249 Family history of ischemic heart disease and other diseases of the circulatory system: Secondary | ICD-10-CM

## 2018-08-24 ENCOUNTER — Encounter: Payer: Self-pay | Admitting: Sports Medicine

## 2018-08-24 ENCOUNTER — Ambulatory Visit (INDEPENDENT_AMBULATORY_CARE_PROVIDER_SITE_OTHER): Payer: Medicare Other | Admitting: Sports Medicine

## 2018-08-24 VITALS — BP 122/72 | Ht 67.5 in | Wt 135.0 lb

## 2018-08-24 DIAGNOSIS — M25551 Pain in right hip: Secondary | ICD-10-CM

## 2018-08-24 DIAGNOSIS — S76312A Strain of muscle, fascia and tendon of the posterior muscle group at thigh level, left thigh, initial encounter: Secondary | ICD-10-CM

## 2018-08-25 ENCOUNTER — Encounter: Payer: Self-pay | Admitting: Sports Medicine

## 2018-08-25 ENCOUNTER — Ambulatory Visit: Payer: Medicare Other | Admitting: Sports Medicine

## 2018-08-25 NOTE — Progress Notes (Signed)
   Subjective:    Patient ID: Edwin Jimenez, male    DOB: 1950/12/04, 67 y.o.   MRN: [id number]  HPI chief complaint: Left hamstring and posterior right hip pain  Edwin Jimenez comes in today complaining of left hamstring pain.  He acutely injured the hamstring while running several days ago.  He felt a pull that he localizes to the posterior left upper leg.  He has not noticed any bruising or swelling.  He has not been able to run due to his pain.  He does have some pain with walking as well.  He has a history of hamstring injuries in the past.  He is getting ready to train for the [facility] [employer]. He is also complaining of some chronic posterior right hip pain.  No injury.  No groin pain.  Pain is tolerable during running but is worse after activity.  Also bothersome at night.  No numbness or tingling.  Interim medical history reviewed Medications reviewed Allergies reviewed    Review of Systems    As above Objective:   Physical Exam  Well-developed, fit appearing.  No acute distress.  Awake alert and oriented x3.  Vital signs reviewed  Left hamstring: Minimal tenderness to palpation in the mid substance of the biceps femoris.  Good strength at 90 degrees of knee flexion and 20 degrees of knee flexion.  No palpable defect.  No soft tissue swelling.  No ecchymosis.  Right hip: Smooth painless hip range of motion with a negative logroll.  There is some tenderness to palpation along the piriformis with reproducible pain with piriformis stretching.  No tenderness over the greater trochanter.  Positive Trendelenburg.  4+/5 strength with resisted hip abduction.  Neurovascularly intact distally.      Assessment & Plan:  Acute left hamstring injury Chronic piriformis syndrome secondary to hip/pelvic stabilizer weakness  Home exercise program to include Askling exercises for his hamstring injury.  He has a compression sleeve at home.  I think his recovery will be quite quick given his absence of  weakness, ecchymosis, and swelling.  He may slowly resume running as his symptoms allow.  He will start hip abductor strengthening and pelvic stabilizer exercises for his chronic piriformis syndrome.  Follow-up with me again in 4 to 6 weeks.  Call with questions or concerns in the interim.

## 2018-09-21 ENCOUNTER — Encounter: Payer: Self-pay | Admitting: Sports Medicine

## 2018-09-21 ENCOUNTER — Ambulatory Visit (INDEPENDENT_AMBULATORY_CARE_PROVIDER_SITE_OTHER): Payer: Medicare Other | Admitting: Sports Medicine

## 2018-09-21 VITALS — BP 98/60 | Ht 67.5 in | Wt 135.0 lb

## 2018-09-21 DIAGNOSIS — S76312A Strain of muscle, fascia and tendon of the posterior muscle group at thigh level, left thigh, initial encounter: Secondary | ICD-10-CM | POA: Diagnosis not present

## 2018-09-21 NOTE — Progress Notes (Signed)
   Subjective:    Patient ID: Edwin Jimenez, male    DOB: Jul 21, 1951, 67 y.o.   MRN: [id number]  HPI   Edwin Jimenez comes in today for follow-up on left hamstring strain.  Overall, he is doing pretty well.  He has returned to some running but is not pushing the pace.  He denies any hamstring pain.  He has been diligent with his Askling exercises.  He is getting ready to train for the [facility]    Review of Systems As above    Objective:   Physical Exam Well-developed, fit appearing.  No acute distress.  Awake alert and oriented x3.  Vital signs reviewed  Left hamstring: No tenderness to palpation.  No obvious deformity.  No palpable defect.  Good strength at 90 degrees and 30 degrees of knee flexion.  No pain with H testing.  Neurovascular intact distally.       Assessment & Plan:   Improving hamstring strain, left leg  Patient will start Nordic exercises.  He may continue to increase his distance and pace using pain as his guide but I did caution about over striding and being careful with running downhill.  I think he should continue with his compression sleeve for the next several months and he understands the importance of continuing with Nordic exercises for maintenance.  As long as he is able to continue to improve and return to his previous level of activity without complication, I will see him on an as-needed basis.

## 2018-10-22 ENCOUNTER — Other Ambulatory Visit: Payer: Self-pay | Admitting: Family Medicine

## 2018-11-18 ENCOUNTER — Encounter: Payer: Self-pay | Admitting: Family Medicine

## 2018-11-18 ENCOUNTER — Ambulatory Visit (INDEPENDENT_AMBULATORY_CARE_PROVIDER_SITE_OTHER): Payer: Medicare Other | Admitting: Family Medicine

## 2018-11-18 VITALS — BP 114/76 | HR 56 | Temp 97.7°F | Ht 67.0 in | Wt 133.4 lb

## 2018-11-18 DIAGNOSIS — N529 Male erectile dysfunction, unspecified: Secondary | ICD-10-CM | POA: Diagnosis not present

## 2018-11-18 DIAGNOSIS — H259 Unspecified age-related cataract: Secondary | ICD-10-CM

## 2018-11-18 DIAGNOSIS — Z125 Encounter for screening for malignant neoplasm of prostate: Secondary | ICD-10-CM

## 2018-11-18 DIAGNOSIS — E785 Hyperlipidemia, unspecified: Secondary | ICD-10-CM

## 2018-11-18 DIAGNOSIS — L309 Dermatitis, unspecified: Secondary | ICD-10-CM

## 2018-11-18 DIAGNOSIS — D692 Other nonthrombocytopenic purpura: Secondary | ICD-10-CM

## 2018-11-18 DIAGNOSIS — L719 Rosacea, unspecified: Secondary | ICD-10-CM | POA: Diagnosis not present

## 2018-11-18 DIAGNOSIS — Z833 Family history of diabetes mellitus: Secondary | ICD-10-CM | POA: Diagnosis not present

## 2018-11-18 DIAGNOSIS — Z0000 Encounter for general adult medical examination without abnormal findings: Secondary | ICD-10-CM | POA: Diagnosis not present

## 2018-11-18 DIAGNOSIS — Z8249 Family history of ischemic heart disease and other diseases of the circulatory system: Secondary | ICD-10-CM | POA: Diagnosis not present

## 2018-11-18 DIAGNOSIS — R011 Cardiac murmur, unspecified: Secondary | ICD-10-CM

## 2018-11-18 LAB — POCT URINALYSIS DIP (PROADVANTAGE DEVICE)
Bilirubin, UA: NEGATIVE
Blood, UA: NEGATIVE
Glucose, UA: NEGATIVE mg/dL
Ketones, POC UA: NEGATIVE mg/dL
Leukocytes, UA: NEGATIVE
Nitrite, UA: NEGATIVE
Protein Ur, POC: NEGATIVE mg/dL
Specific Gravity, Urine: 1.03
Urobilinogen, Ur: 3.5
pH, UA: 5.5 (ref 5.0–8.0)

## 2018-11-18 NOTE — Addendum Note (Signed)
Addended by: Elyse Jarvis on: 11/18/2018 02:30 PM   Modules accepted: Orders

## 2018-11-18 NOTE — Progress Notes (Signed)
Subjective:    Patient ID: Edwin Jimenez, male    DOB: 1950-12-13, 68 y.o.   MRN: [id number]  HPI He is here for complete examination.  He does have a family history of heart disease and is presently on Lipitor.  He does use OTC omega-3. he did have a coronary calcium score which was apparently fairly good.  He does have a history of a flow murmur.  He is having no difficulty with that.  There is also family history of diabetes.  He did have 1 cataract surgery and is holding off on having the other cataract done.  Apparently the cataract surgery on the left eye made no difference in his vision.  His eczema gives him more trouble in the winter.  He also has rosacea but that is not causing much difficulty at the present time.  He does use sildenafil for erectile dysfunction.  He exercises regularly at a relatively high level.  He does run marathons.  Family and social history as well as health maintenance and immunizations was otherwise normal.   Review of Systems  All other systems reviewed and are negative.      Objective:   Physical Exam BP 114/76 (BP Location: Left Arm, Patient Position: Sitting)   Pulse (!) 56   Temp 97.7 F (36.5 C)   Ht 5\' 7"  (1.702 m)   Wt 133 lb 6.4 oz (60.5 kg)   SpO2 97%   BMI 20.89 kg/m   General Appearance:    Alert, cooperative, no distress, appears stated age  Head:    Normocephalic, without obvious abnormality, atraumatic  Eyes:    PERRL, conjunctiva/corneas clear, EOM's intact, fundi    benign  Ears:    Normal TM's and external ear canals  Nose:   Nares normal, mucosa normal, no drainage or sinus   tenderness  Throat:   Lips, mucosa, and tongue normal; teeth and gums normal  Neck:   Supple, no lymphadenopathy;  thyroid:  no   enlargement/tenderness/nodules; no carotid   bruit or JVD     Lungs:     Clear to auscultation bilaterally without wheezes, rales or     ronchi; respirations unlabored      Heart:    Regular rate and rhythm, S1 and S2  normal, no murmur, rub   or gallop     Abdomen:     Soft, non-tender, nondistended, normoactive bowel sounds,    no masses, no hepatosplenomegaly  Genitalia:   Deferred  Rectal:   Deferred  Extremities:   No clubbing, cyanosis or edema  Pulses:   2+ and symmetric all extremities  Skin:   Skin color, texture, turgor normal, purpuric lesions noted on the forearms.  Lymph nodes:   Cervical, supraclavicular, and axillary nodes normal  Neurologic:   CNII-XII intact, normal strength, sensation and gait; reflexes 2+ and symmetric throughout          Psych:   Normal mood, affect, hygiene and grooming.           Assessment & Plan:  Routine general medical examination at a health care facility - Plan: CBC with Differential/Platelet, Comprehensive metabolic panel, Lipid panel  Age-related cataract of both eyes, unspecified age-related cataract type - Plan: CBC with Differential/Platelet, Comprehensive metabolic panel  Eczema, unspecified type - Plan: CBC with Differential/Platelet, Comprehensive metabolic panel  Erectile dysfunction, unspecified erectile dysfunction type - Plan: CBC with Differential/Platelet, Comprehensive metabolic panel  Family history of diabetes mellitus - Plan: CBC with Differential/Platelet,  Comprehensive metabolic panel, Lipid panel  Family history of ischemic heart disease (IHD) - Plan: CBC with Differential/Platelet, Comprehensive metabolic panel, Lipid panel  Flow murmur  Hyperlipidemia with target LDL less than 70 - Plan: Lipid panel  Screening for prostate cancer - Plan: PSA  Senile purpura ([place]) - Plan: CBC with Differential/Platelet, Comprehensive metabolic panel  Rosacea - Plan: CBC with Differential/Platelet, Comprehensive metabolic panel  Continue on present medications.  Discussed the possibility of placing him on Vascepa pending blood results.

## 2018-11-19 LAB — CBC WITH DIFFERENTIAL/PLATELET
Basophils Absolute: 0 10*3/uL (ref 0.0–0.2)
Basos: 1 %
EOS (ABSOLUTE): 0.1 10*3/uL (ref 0.0–0.4)
Eos: 3 %
Hematocrit: 44.5 % (ref 37.5–51.0)
Hemoglobin: 14.9 g/dL (ref 13.0–17.7)
Immature Grans (Abs): 0 10*3/uL (ref 0.0–0.1)
Immature Granulocytes: 0 %
Lymphocytes Absolute: 1.4 10*3/uL (ref 0.7–3.1)
Lymphs: 39 %
MCH: 32 pg (ref 26.6–33.0)
MCHC: 33.5 g/dL (ref 31.5–35.7)
MCV: 96 fL (ref 79–97)
Monocytes Absolute: 0.3 10*3/uL (ref 0.1–0.9)
Monocytes: 9 %
Neutrophils Absolute: 1.8 10*3/uL (ref 1.4–7.0)
Neutrophils: 48 %
Platelets: 201 10*3/uL (ref 150–450)
RBC: 4.65 x10E6/uL (ref 4.14–5.80)
RDW: 12.5 % (ref 11.6–15.4)
WBC: 3.7 10*3/uL (ref 3.4–10.8)

## 2018-11-19 LAB — COMPREHENSIVE METABOLIC PANEL
ALT: 70 IU/L — ABNORMAL HIGH (ref 0–44)
AST: 49 IU/L — ABNORMAL HIGH (ref 0–40)
Albumin/Globulin Ratio: 1.8 (ref 1.2–2.2)
Albumin: 4 g/dL (ref 3.8–4.8)
Alkaline Phosphatase: 100 IU/L (ref 39–117)
BUN/Creatinine Ratio: 24 (ref 10–24)
BUN: 20 mg/dL (ref 8–27)
Bilirubin Total: 0.5 mg/dL (ref 0.0–1.2)
CO2: 23 mmol/L (ref 20–29)
Calcium: 9.2 mg/dL (ref 8.6–10.2)
Chloride: 107 mmol/L — ABNORMAL HIGH (ref 96–106)
Creatinine, Ser: 0.84 mg/dL (ref 0.76–1.27)
GFR calc Af Amer: 105 mL/min/{1.73_m2} (ref >59)
GFR calc non Af Amer: 91 mL/min/{1.73_m2} (ref >59)
Globulin, Total: 2.2 g/dL (ref 1.5–4.5)
Glucose: 87 mg/dL (ref 65–99)
Potassium: 5 mmol/L (ref 3.5–5.2)
Sodium: 142 mmol/L (ref 134–144)
Total Protein: 6.2 g/dL (ref 6.0–8.5)

## 2018-11-19 LAB — PSA: Prostate Specific Ag, Serum: 0.9 ng/mL (ref 0.0–4.0)

## 2018-11-19 LAB — LIPID PANEL
Chol/HDL Ratio: 2.5 ratio (ref 0.0–5.0)
Cholesterol, Total: 138 mg/dL (ref 100–199)
HDL: 55 mg/dL (ref >39)
LDL Calculated: 76 mg/dL (ref 0–99)
Triglycerides: 37 mg/dL (ref 0–149)
VLDL Cholesterol Cal: 7 mg/dL (ref 5–40)

## 2018-12-08 DIAGNOSIS — H2513 Age-related nuclear cataract, bilateral: Secondary | ICD-10-CM | POA: Diagnosis not present

## 2019-02-01 ENCOUNTER — Other Ambulatory Visit: Payer: Self-pay

## 2019-02-01 ENCOUNTER — Ambulatory Visit (INDEPENDENT_AMBULATORY_CARE_PROVIDER_SITE_OTHER): Payer: Medicare Other | Admitting: Family Medicine

## 2019-02-01 ENCOUNTER — Encounter: Payer: Self-pay | Admitting: Family Medicine

## 2019-02-01 VITALS — BP 110/72 | HR 61 | Temp 97.6°F | Wt 133.0 lb

## 2019-02-01 DIAGNOSIS — H6123 Impacted cerumen, bilateral: Secondary | ICD-10-CM

## 2019-02-01 DIAGNOSIS — L989 Disorder of the skin and subcutaneous tissue, unspecified: Secondary | ICD-10-CM

## 2019-02-01 DIAGNOSIS — D485 Neoplasm of uncertain behavior of skin: Secondary | ICD-10-CM | POA: Diagnosis not present

## 2019-02-01 DIAGNOSIS — D1722 Benign lipomatous neoplasm of skin and subcutaneous tissue of left arm: Secondary | ICD-10-CM | POA: Diagnosis not present

## 2019-02-01 MED ORDER — LIDOCAINE-EPINEPHRINE 2 %-1:100000 IJ SOLN
1.7000 mL | Freq: Once | INTRAMUSCULAR | Status: AC
  Administered 2019-02-01: 14:00:00 1.7 mL via INTRADERMAL

## 2019-02-01 NOTE — Progress Notes (Signed)
   Subjective:    Patient ID: Mariel Gaudin, male    DOB: May 31, 1951, 68 y.o.   MRN: [id number]  HPI Approximately 5 days ago he noted some swelling just proximal to the medial epicondyles of his left elbow.  He does have a lipoma near this but this was a separate issue.  It apparently quieted down but then yesterday he noted more discomfort in that area.   Review of Systems     Objective:   Physical Exam Exam of the left elbow does show a large lipoma near the medial epicondyle and proximal to that a 1 x 2 cm firm lobulated mobile lesion. Also wax present in both ears.      Assessment & Plan:  Skin lesion I discussed the fact that it actually felt like a sebaceous cyst because it was much more firm than the lipoma.  We decided to investigate this further.  The area was injected with Xylocaine and epinephrine.  A 1-1/2 cm incision was made.  The mobile lesion( 1X1.5cm) was able to be expressed out of the lesion.  The lesion was reddish-purple in color and rubbery in texture; one 5-0 Ethilon suture was applied with compression dressing on that.  It will be sent off for pathology. The ears were lavaged.

## 2019-02-01 NOTE — Addendum Note (Signed)
Addended by: Elyse Jarvis on: 02/01/2019 01:48 PM   Modules accepted: Orders

## 2019-02-08 ENCOUNTER — Other Ambulatory Visit: Payer: Self-pay

## 2019-02-08 ENCOUNTER — Ambulatory Visit (INDEPENDENT_AMBULATORY_CARE_PROVIDER_SITE_OTHER): Payer: Medicare Other | Admitting: Family Medicine

## 2019-02-08 VITALS — BP 102/68 | HR 59 | Temp 97.5°F | Wt 131.4 lb

## 2019-02-08 DIAGNOSIS — Z4802 Encounter for removal of sutures: Secondary | ICD-10-CM

## 2019-02-08 NOTE — Progress Notes (Signed)
   Subjective:    Patient ID: Edwin Jimenez, male    DOB: 12-Oct-1950, 68 y.o.   MRN: [id number]  HPI He is here for suture removal.  He has had no difficulty since excision of the lesion.   Review of Systems     Objective:   Physical Exam Alert and in no distress.  The wound is healing nicely.       Assessment & Plan:  Visit for suture removal 1 suture was removed without difficulty.  The path report was benign.

## 2019-02-10 ENCOUNTER — Encounter: Payer: Self-pay | Admitting: Family Medicine

## 2019-04-21 DIAGNOSIS — L570 Actinic keratosis: Secondary | ICD-10-CM | POA: Diagnosis not present

## 2019-04-21 DIAGNOSIS — Z1283 Encounter for screening for malignant neoplasm of skin: Secondary | ICD-10-CM | POA: Diagnosis not present

## 2019-04-21 DIAGNOSIS — L821 Other seborrheic keratosis: Secondary | ICD-10-CM | POA: Diagnosis not present

## 2019-04-21 DIAGNOSIS — L308 Other specified dermatitis: Secondary | ICD-10-CM | POA: Diagnosis not present

## 2019-04-21 DIAGNOSIS — X32XXXD Exposure to sunlight, subsequent encounter: Secondary | ICD-10-CM | POA: Diagnosis not present

## 2019-04-21 DIAGNOSIS — B0089 Other herpesviral infection: Secondary | ICD-10-CM | POA: Diagnosis not present

## 2019-05-07 ENCOUNTER — Other Ambulatory Visit: Payer: Self-pay

## 2019-07-13 ENCOUNTER — Encounter: Payer: Self-pay | Admitting: Cardiology

## 2019-07-13 ENCOUNTER — Other Ambulatory Visit: Payer: Self-pay

## 2019-07-13 ENCOUNTER — Ambulatory Visit (INDEPENDENT_AMBULATORY_CARE_PROVIDER_SITE_OTHER): Payer: Medicare Other | Admitting: Cardiology

## 2019-07-13 VITALS — BP 134/81 | HR 66 | Temp 97.1°F | Ht 67.0 in | Wt 134.4 lb

## 2019-07-13 DIAGNOSIS — Z8249 Family history of ischemic heart disease and other diseases of the circulatory system: Secondary | ICD-10-CM | POA: Diagnosis not present

## 2019-07-13 DIAGNOSIS — R011 Cardiac murmur, unspecified: Secondary | ICD-10-CM | POA: Diagnosis not present

## 2019-07-13 DIAGNOSIS — I2510 Atherosclerotic heart disease of native coronary artery without angina pectoris: Secondary | ICD-10-CM

## 2019-07-13 DIAGNOSIS — Z7189 Other specified counseling: Secondary | ICD-10-CM | POA: Diagnosis not present

## 2019-07-13 NOTE — Progress Notes (Signed)
Cardiology Office Note:    Date:  07/13/2019   ID:  Edwin Jimenez, DOB 1951-01-24, MRN [id number]  PCP:  Edwin Lung, MD  Cardiologist:  Edwin Dresser, MD PhD  Referring MD: Edwin Lung, MD   CC: follow up  History of Present Illness:    Edwin Jimenez is a 68 y.o. male with a hx of hyperlipidemia, family history of CAD who is seen as a follow up at the request of Edwin Lung, MD for the evaluation and management of murmur and prevention strategy. Last visit with Edwin Jimenez on 04/30/18.  Today: Doing well overall. A few questions: -is ear crease predictive of CAD? (discussed) -still running, but because of Covid has not raced other than virtually. Feels he is getting slower as he ages -when he first starts running, feels his heart racing. Resolves with time or doesn't occur if he warms up. Has had for years. No chest pain. Feels a little short of breath when it happens. Lasts less than a minute.  Runs 6 days/week, runs 6-15 miles at a time. Runs in a group on Saturdays.  We discussed CV guidelines, symptoms of concern today, see below.  Denies chest pain, shortness of breath at rest or with normal exertion. No PND, orthopnea, LE edema or unexpected weight gain. No syncope or palpitations.   Past Medical History:  Diagnosis Date  . Dyslipidemia   . ED (erectile dysfunction)   . Hyperlipidemia     Past Surgical History:  Procedure Laterality Date  . COLONOSCOPY     x2  . EXCISION MASS ABDOMINAL Left    angiolipoma  . HERNIA REPAIR  06/16/12   LIH  . INGUINAL HERNIA REPAIR  06/16/2012   Procedure: HERNIA REPAIR INGUINAL ADULT;  Surgeon: Edwin Earls, MD;  Location: [facility];  Service: General;  Laterality: Left;  left inguinal hernia repair with mesh    Current Medications: Current Outpatient Medications on File Prior to Visit  Medication Sig  . atorvastatin (LIPITOR) 20 MG tablet TAKE 1 TABLET DAILY  . augmented  betamethasone dipropionate (DIPROLENE-AF) 0.05 % cream Apply 1 application topically as needed.  . Azelaic Acid (FINACEA) 15 % cream Apply topically 2 (two) times daily. After skin is thoroughly washed and patted dry, gently but thoroughly massage a thin film of azelaic acid cream into the affected area twice daily, in the morning and evening.  . Coenzyme Q10 (CO Q10) 200 MG CAPS Take 1 capsule by mouth daily.  . fish oil-omega-3 fatty acids 1000 MG capsule Take 2 g by mouth daily.    Marland Kitchen OMEPRAZOLE PO Take by mouth.  . sildenafil (REVATIO) 20 MG tablet Use between 1-5 pills as needed daily  . halobetasol (ULTRAVATE) 0.05 % cream Apply topically 2 (two) times daily.   No current facility-administered medications on file prior to visit.      Allergies:   Patient has no known allergies.   Social History   Socioeconomic History  . Marital status: Married    Spouse name: Not on file  . Number of children: Not on file  . Years of education: Not on file  . Highest education level: Not on file  Occupational History  . Not on file  Social Needs  . Financial resource strain: Not on file  . Food insecurity    Worry: Not on file    Inability: Not on file  . Transportation needs    Medical: Not on file  Non-medical: Not on file  Tobacco Use  . Smoking status: Never Smoker  . Smokeless tobacco: Never Used  Substance and Sexual Activity  . Alcohol use: Yes    Comment: wine of weekends  . Drug use: No  . Sexual activity: Yes  Lifestyle  . Physical activity    Days per week: Not on file    Minutes per session: Not on file  . Stress: Not on file  Relationships  . Social Herbalist on phone: Not on file    Gets together: Not on file    Attends religious service: Not on file    Active member of club or organization: Not on file    Attends meetings of clubs or organizations: Not on file    Relationship status: Not on file  Other Topics Concern  . Not on file  Social  History Narrative  . Not on file     Family History: The patient's family history includes Diabetes in his sister; Heart disease (age of onset: 22) in his father; Stroke in his mother; Transient ischemic attack in his father. There is no history of Hypertension. All of the men on his father's side died of Mis in adulthood. No early onset CAD.   ROS:   Please see the history of present illness.  Additional pertinent ROS: Constitutional: Negative for chills, fever, night sweats, unintentional weight loss  HENT: Negative for ear pain and hearing loss.   Eyes: Negative for loss of vision and eye pain.  Respiratory: Negative for cough, sputum, wheezing.   Cardiovascular: See HPI. Gastrointestinal: Negative for abdominal pain, melena, and hematochezia.  Genitourinary: Negative for dysuria and hematuria.  Musculoskeletal: Negative for falls and myalgias.  Skin: Negative for itching and rash.  Neurological: Negative for focal weakness, focal sensory changes and loss of consciousness.  Endo/Heme/Allergies: Does bruise/bleed easily when he takes aspirin.  EKGs/Labs/Other Studies Reviewed:    The following studies were reviewed today: Calcium score 08/11/2018 Calcium score 14.6.  Likely benign calcified nodule also noted, seen on prior CXR. See result note, communicated to patient, recommended PCP follow up.  Echo 7.29.19 Study Conclusions  - Left ventricle: The cavity size was normal. Wall thickness was   normal. Systolic function was normal. The estimated ejection   fraction was in the range of 55% to 60%. Wall motion was normal;   there were no regional wall motion abnormalities. Doppler   parameters are consistent with abnormal left ventricular   relaxation (grade 1 diastolic dysfunction).  Impressions:  - Normal LV systolic function; mild diastolic dysfunction;   sclerotic aortic valve.  Reviewed CTPE report from [facility] as well.   EKG:  ECG personally reviewed. The  ekg ordered today demonstrates sinus bradycardia at 56 bpm, TWI in III/avf with flat T waves in II. Similar to prior.  Recent Labs: 11/18/2018: ALT 70; BUN 20; Creatinine, Ser 0.84; Hemoglobin 14.9; Platelets 201; Potassium 5.0; Sodium 142  Recent Lipid Panel    Component Value Date/Time   CHOL 138 11/18/2018 0805   TRIG 37 11/18/2018 0805   HDL 55 11/18/2018 0805   CHOLHDL 2.5 11/18/2018 0805   CHOLHDL 2.2 09/22/2017 1111   VLDL 8 10/22/2016 0943   LDLCALC 76 11/18/2018 0805   LDLCALC 62 09/22/2017 1111    Physical Exam:    Vitals:   07/13/19 0756  BP: 134/81  Pulse: 66  Temp: (!) 97.1 F (36.2 C)  SpO2: 100%    Wt Readings from Last  3 Encounters:  07/13/19 134 lb 6.4 oz (61 kg)  02/08/19 131 lb 6.4 oz (59.6 kg)  02/01/19 133 lb (60.3 kg)    GEN: Well nourished, well developed in no acute distress HEENT: Normal, moist mucous membranes NECK: No JVD CARDIAC: regular rhythm, normal S1 and S2, no rubs or gallops. 1/6 continuous flow murmur. Does have faint mid systolic click. VASCULAR: Radial and DP pulses 2+ bilaterally. No carotid bruits RESPIRATORY:  Clear to auscultation without rales, wheezing or rhonchi  ABDOMEN: Soft, non-tender, non-distended MUSCULOSKELETAL:  Ambulates independently SKIN: Warm and dry, no edema NEUROLOGIC:  Alert and oriented x 3. No focal neuro deficits noted. PSYCHIATRIC:  Normal affect   ASSESSMENT:    1. Coronary artery calcification seen on CT scan   2. Heart murmur   3. Cardiac risk counseling   4. Family history of heart disease   5. Counseling on health promotion and disease prevention    PLAN:    Flow murmur: previously noted, echo without significant valve abnormality (murmur not consistent with aortic sclerosis). Mid systolic click noted as well  CV risk and Prevention: reviewed today. Does feel he is gradually slowing, but able to complete significant activity without limiting symptoms. Recommend continued excellent lifestyle.   -calcium score 14 -personal history of hyperlipidemia,Coronary calcium on CT, family history of CAD.  -LDL 76 11/2018, excellent control -continue atorvastatin -have discussed data re: OTC fish oil and CV disease, but ok to continue per personal preference  The 10-year ASCVD risk score Mikey Bussing DC Brooke Bonito., et al., 2013) is: 12.1%   Values used to calculate the score:     Age: 33 years     Sex: Male     Is Non-Hispanic African American: No     Diabetic: No     Tobacco smoker: No     Systolic Blood Pressure: Q000111Q mmHg     Is BP treated: No     HDL Cholesterol: 55 mg/dL     Total Cholesterol: 138 mg/dL  Plan for follow up: 2 years  Medication Adjustments/Labs and Tests Ordered: Current medicines are reviewed at length with the patient today.  Concerns regarding medicines are outlined above.  Orders Placed This Encounter  Procedures  . EKG 12-Lead   No orders of the defined types were placed in this encounter.   Patient Instructions  Medication Instructions:  Your Physician recommend you continue on your current medication as directed.    If you need a refill on your cardiac medications before your next appointment, please call your pharmacy.   Lab work: None  Testing/Procedures: None  Follow-Up: At [facility], you and your health needs are our priority.  As part of our continuing mission to provide you with exceptional heart care, we have created designated Provider Care Teams.  These Care Teams include your primary Cardiologist (physician) and Advanced Practice Providers (APPs -  Physician Assistants and Nurse Practitioners) who all work together to provide you with the care you need, when you need it. You will need a follow up appointment in 2 years.  Please call our office 2 months in advance to schedule this appointment.  You may see Edwin Dresser, MD or one of the following Advanced Practice Providers on your designated Care Team:   Rosaria Ferries, PA-C . Jory Sims, DNP, ANP      Signed, Edwin Dresser, MD PhD 07/13/2019  [facility]

## 2019-07-13 NOTE — Patient Instructions (Signed)
Medication Instructions:  Your Physician recommend you continue on your current medication as directed.    If you need a refill on your cardiac medications before your next appointment, please call your pharmacy.   Lab work: None  Testing/Procedures: None  Follow-Up: At [facility], you and your health needs are our priority.  As part of our continuing mission to provide you with exceptional heart care, we have created designated Provider Care Teams.  These Care Teams include your primary Cardiologist (physician) and Advanced Practice Providers (APPs -  Physician Assistants and Nurse Practitioners) who all work together to provide you with the care you need, when you need it. You will need a follow up appointment in 2 years.  Please call our office 2 months in advance to schedule this appointment.  You may see Buford Dresser, MD or one of the following Advanced Practice Providers on your designated Care Team:   Rosaria Ferries, PA-C . Jory Sims, DNP, ANP

## 2019-07-14 ENCOUNTER — Other Ambulatory Visit (INDEPENDENT_AMBULATORY_CARE_PROVIDER_SITE_OTHER): Payer: Medicare Other

## 2019-07-14 DIAGNOSIS — Z23 Encounter for immunization: Secondary | ICD-10-CM | POA: Diagnosis not present

## 2019-07-21 DIAGNOSIS — K219 Gastro-esophageal reflux disease without esophagitis: Secondary | ICD-10-CM | POA: Diagnosis not present

## 2019-07-21 DIAGNOSIS — R1310 Dysphagia, unspecified: Secondary | ICD-10-CM | POA: Diagnosis not present

## 2019-07-21 DIAGNOSIS — R7401 Elevation of levels of liver transaminase levels: Secondary | ICD-10-CM | POA: Diagnosis not present

## 2019-07-22 ENCOUNTER — Other Ambulatory Visit: Payer: Self-pay | Admitting: Gastroenterology

## 2019-07-22 DIAGNOSIS — K219 Gastro-esophageal reflux disease without esophagitis: Secondary | ICD-10-CM | POA: Diagnosis not present

## 2019-07-22 DIAGNOSIS — R7401 Elevation of levels of liver transaminase levels: Secondary | ICD-10-CM | POA: Diagnosis not present

## 2019-07-22 DIAGNOSIS — R1310 Dysphagia, unspecified: Secondary | ICD-10-CM | POA: Diagnosis not present

## 2019-07-22 DIAGNOSIS — R7989 Other specified abnormal findings of blood chemistry: Secondary | ICD-10-CM

## 2019-07-23 ENCOUNTER — Ambulatory Visit
Admission: RE | Admit: 2019-07-23 | Discharge: 2019-07-23 | Disposition: A | Discharge status: Home / Self Care | Payer: Medicare Other | Source: Ambulatory Visit | Attending: Gastroenterology | Admitting: Gastroenterology

## 2019-07-23 DIAGNOSIS — R7989 Other specified abnormal findings of blood chemistry: Secondary | ICD-10-CM | POA: Diagnosis not present

## 2019-07-23 IMAGING — US US ABDOMEN LIMITED
1 series · 14 of 25 positions shown · non-contrast
Comparison: [DATE] cardiac CT.

CLINICAL DATA: Elevated liver function tests.

EXAM:
ULTRASOUND ABDOMEN LIMITED RIGHT UPPER QUADRANT

[Series 1: us abdomen limited · 0.17mm/px · 14 of 51 slices shown]
[im 1/51]
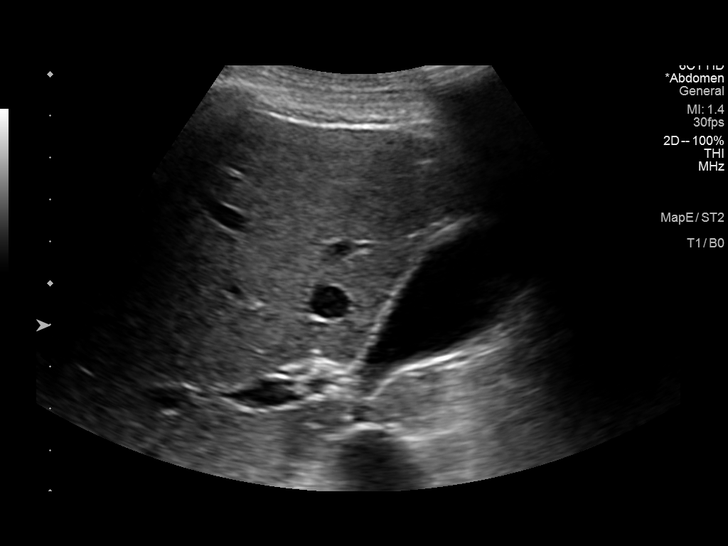
[im 5/51]
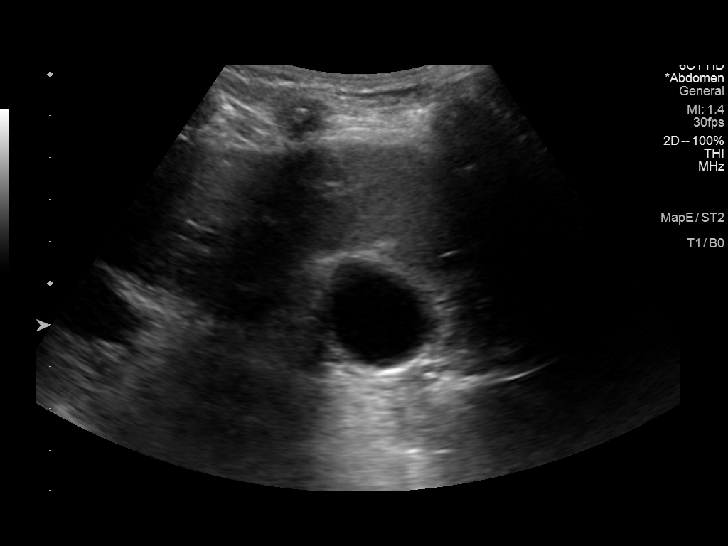
[im 9/51]
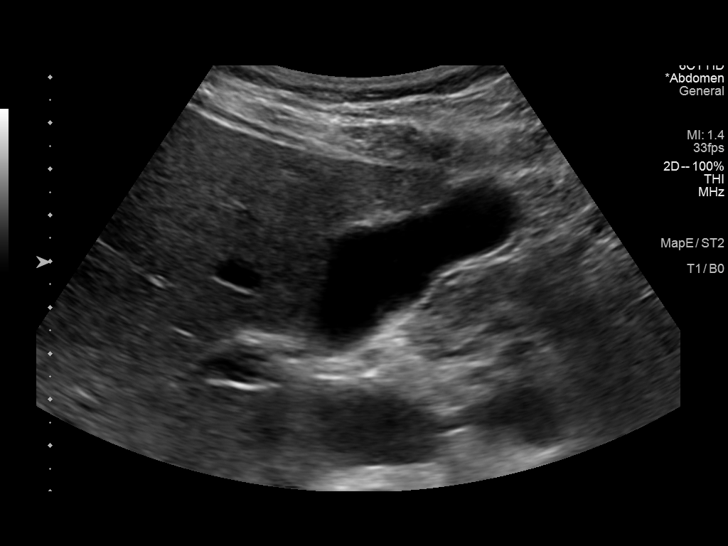
[im 13/51]
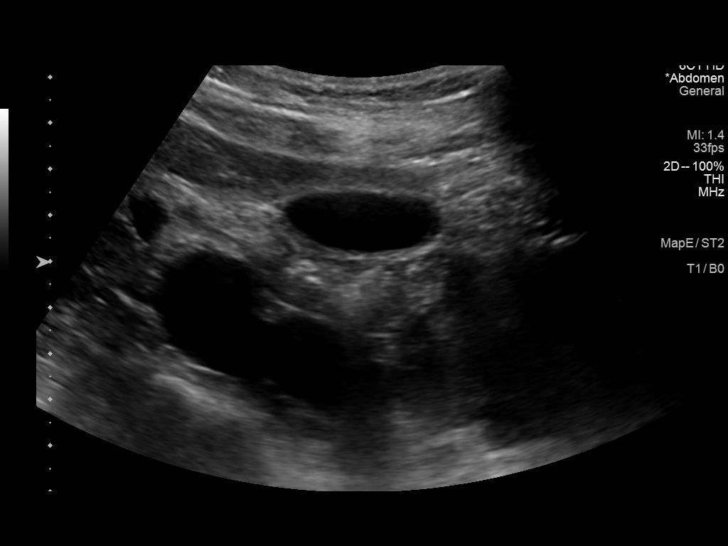
[im 17/51]
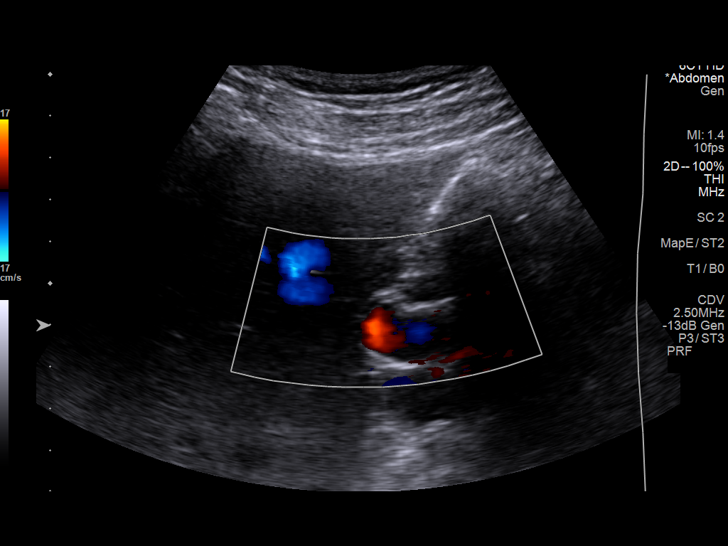
[im 19/51]
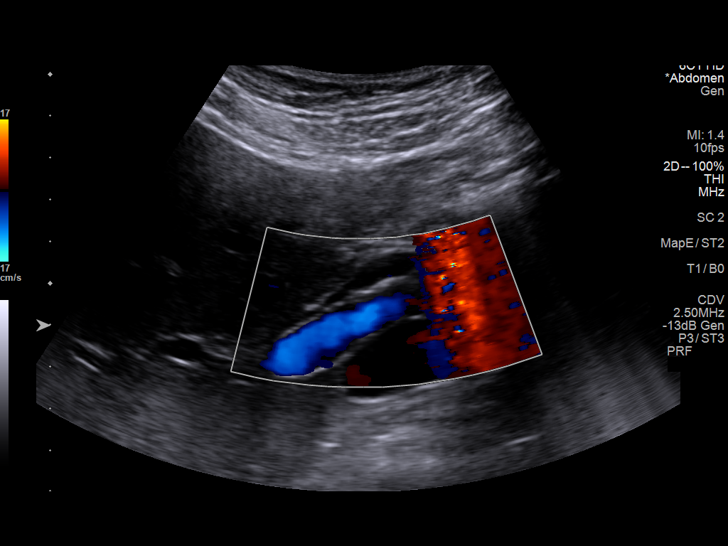
[im 23/51]
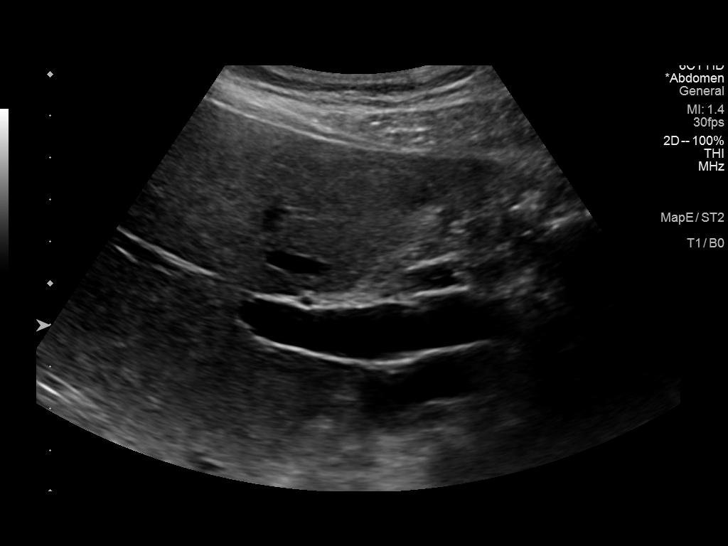
[im 28/51]
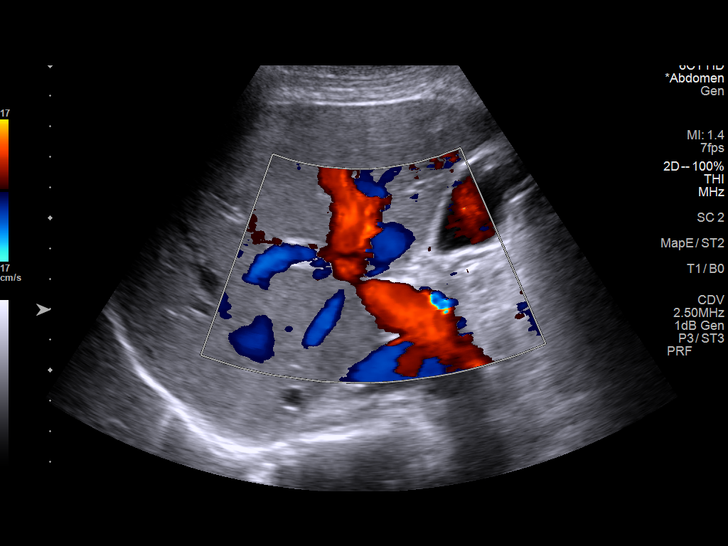
[im 32/51]
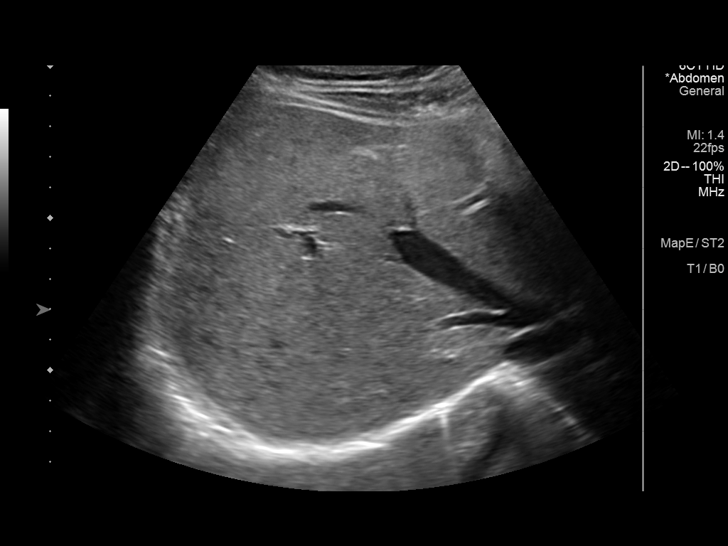
[im 34/51]
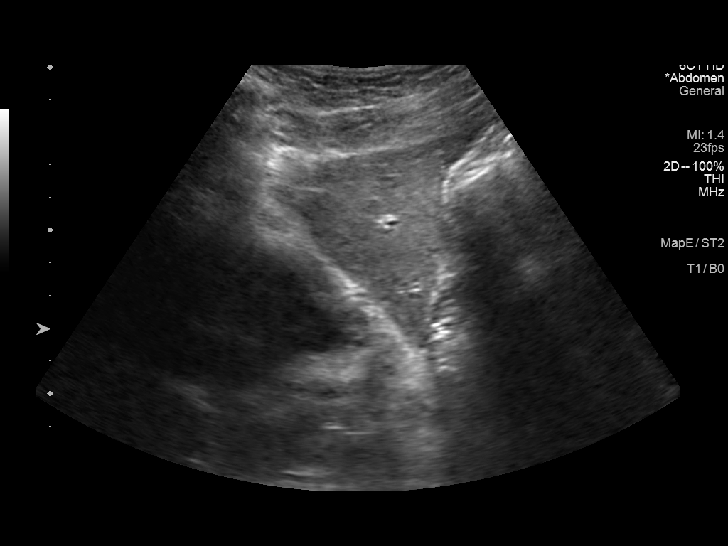
[im 38/51]
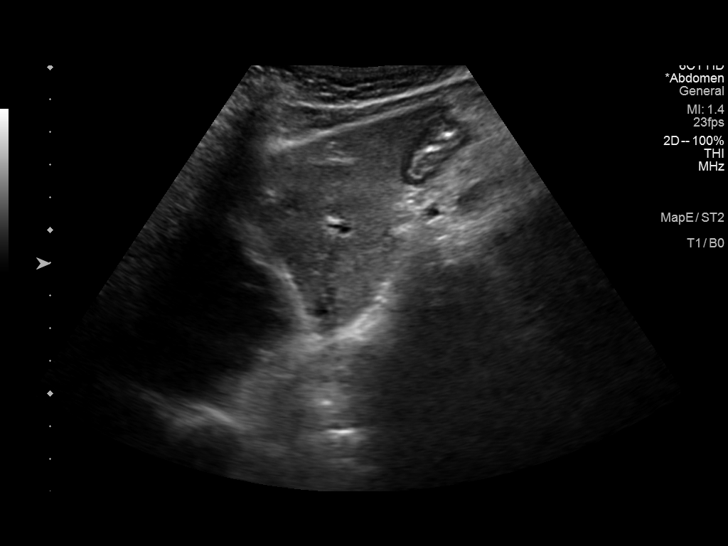
[im 42/51]
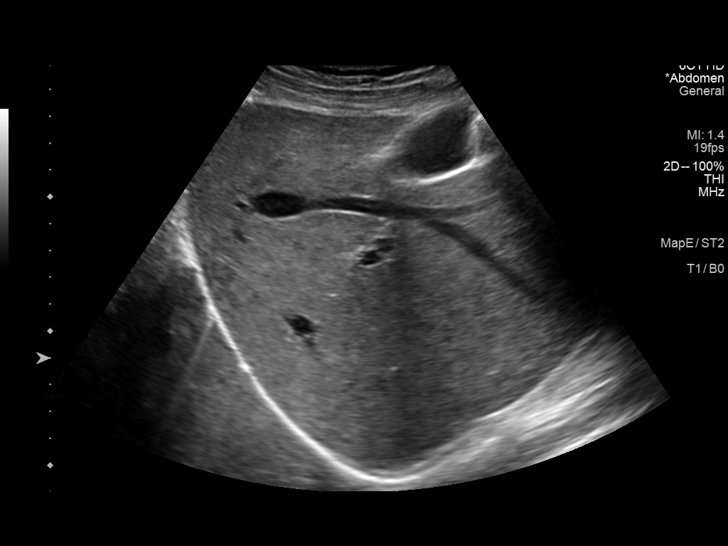
[im 46/51]
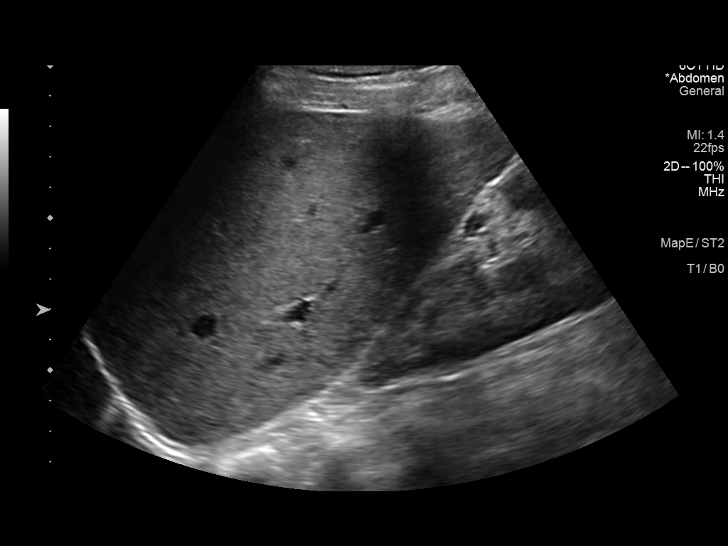
[im 51/51]
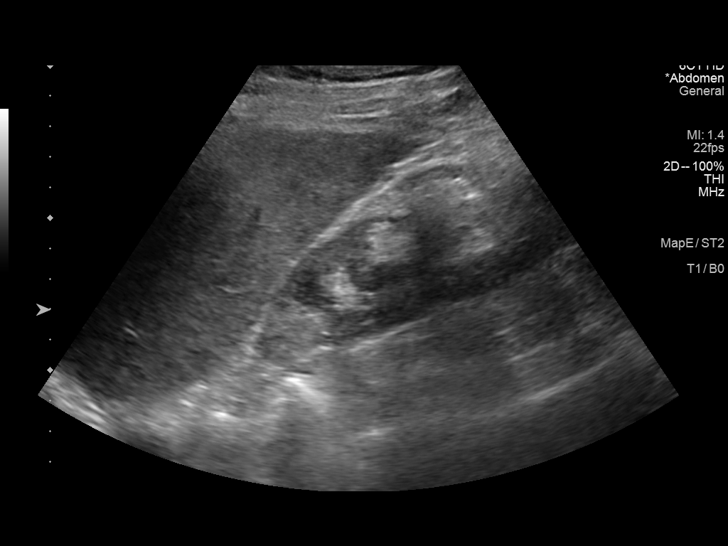

[14 of 25 positions shown; findings below may reference images not displayed]

FINDINGS: Gallbladder:

No gallstones or wall thickening visualized. No sonographic Murphy
sign noted by sonographer.

Common bile duct:

Diameter: Upper normal for age, 7 mm.

Liver:

No focal lesion identified. Within normal limits in parenchymal
echogenicity. Portal vein is patent on color Doppler imaging with
normal direction of blood flow towards the liver.

Other: None.
IMPRESSION: No acute process or explanation for liver function test elevation.

## 2019-08-04 ENCOUNTER — Encounter: Payer: Self-pay | Admitting: Cardiology

## 2019-10-28 ENCOUNTER — Other Ambulatory Visit: Payer: Self-pay

## 2019-10-28 ENCOUNTER — Encounter: Payer: Self-pay | Admitting: Sports Medicine

## 2019-10-28 ENCOUNTER — Ambulatory Visit (INDEPENDENT_AMBULATORY_CARE_PROVIDER_SITE_OTHER): Payer: Medicare Other | Admitting: Sports Medicine

## 2019-10-28 DIAGNOSIS — S86819D Strain of other muscle(s) and tendon(s) at lower leg level, unspecified leg, subsequent encounter: Secondary | ICD-10-CM | POA: Diagnosis not present

## 2019-10-28 DIAGNOSIS — S86112D Strain of other muscle(s) and tendon(s) of posterior muscle group at lower leg level, left leg, subsequent encounter: Secondary | ICD-10-CM | POA: Diagnosis not present

## 2019-10-28 NOTE — Assessment & Plan Note (Signed)
Patient has a history of calf muscle strains in both legs.  Prior to this was recuperating from right calf muscle strain.  Physical exam is normal except for mild tenderness palpation in the lateral and medial gastrocnemius.  Ultrasound was negative for muscle tear or inflammation.  Significant amount of scar tissue noted throughout the musculature. -Home exercises/stretching -Rest, with gradual increase in activity as tolerated. -Follow-up as needed

## 2019-10-28 NOTE — Progress Notes (Signed)
   HPI  CC: Left calf pain  Patient is a long-distance runner with a history of multiple episodes of calf pain bilaterally.  Presenting today after feeling a sudden pain in his left calf while running last Saturday.  He was 8 miles into it run and felt a sudden sharp pain in his calf muscle.  He stopped and has not resumed running since.  He put ice on it that day, but has not used ice or any pain medication since that time.  He states he is not having any pain currently and it does not hurt when he walks, stands or rests.  No numbness, no paresthesia, no weakness.  No swelling.  Medications/Interventions Tried: Ice  See HPI and/or previous note for associated ROS.  Objective: BP 108/70   Ht 5' 7.5" (1.715 m)   Wt 134 lb (60.8 kg)   BMI 20.68 kg/m  Gen: NAD, well groomed, a/o x3, normal affect.  CV: Well-perfused. Warm.  Resp: Non-labored.  Neuro: Sensation intact in lower extremities bilaterally.  Strength equal bilaterally. MSK: Normal gait.  No swelling in the legs.  No asymmetry.  Normal range of motion.  Mild tenderness to palpation medial and laterally in the mid posterior calf.  Minimal pain posteriorly with eversion of left foot.  Ultrasound left calf: Scar tissue noted throughout the medial lateral gastrocnemius muscles.  No swelling/inflammation or muscle tear was noted.  Achilles normal.  Assessment and plan:  Strain of calf muscle, subsequent encounter Patient has a history of calf muscle strains in both legs.  Prior to this was recuperating from right calf muscle strain.  Physical exam is normal except for mild tenderness palpation in the lateral and medial gastrocnemius.  Ultrasound was negative for muscle tear or inflammation.  Significant amount of scar tissue noted throughout the musculature. -Home exercises/stretching -Rest, with gradual increase in activity as tolerated. -Follow-up as needed    Clemetine Marker, MD, PGY 2 [facility] 10/28/2019  11:12 AM   Patient seen and evaluated with the resident.  I agree with the above plan of care.  Ultrasound was performed by our sports medicine fellow in clinic today.  There is definite scar tissue throughout the gastrocnemius but I do not see any new tear.    Home exercises are to consist of heel walks and toe walks.  5/16 inch heel lifts as well.  He already has a compression sleeve to wear with running..  Slow, gradual return to running.  Follow-up as needed.

## 2019-10-29 ENCOUNTER — Other Ambulatory Visit: Payer: Self-pay | Admitting: Family Medicine

## 2019-10-29 ENCOUNTER — Ambulatory Visit: Payer: Medicare Other | Attending: Internal Medicine

## 2019-10-29 DIAGNOSIS — Z23 Encounter for immunization: Secondary | ICD-10-CM | POA: Insufficient documentation

## 2019-10-29 NOTE — Progress Notes (Signed)
   Covid-19 Vaccination Clinic  Name:  Edwin Jimenez    MRN: [id number] DOB: 1951-10-03  10/29/2019  [name] was observed post Covid-19 immunization for 15 minutes without incidence. He was provided with Vaccine Information Sheet and instruction to access the V-Safe system.   [name] was instructed to call 911 with any severe reactions post vaccine: Marland Kitchen Difficulty breathing  . Swelling of your face and throat  . A fast heartbeat  . A bad rash all over your body  . Dizziness and weakness    Immunizations Administered    Name Date Dose VIS Date Route   Pfizer COVID-19 Vaccine 10/29/2019  4:01 PM 0.3 mL 09/17/2019 Intramuscular   Manufacturer: [place]   Lot: [id number]   [place]: [id number]

## 2019-11-19 ENCOUNTER — Ambulatory Visit: Payer: Medicare Other | Attending: Internal Medicine

## 2019-11-19 DIAGNOSIS — Z23 Encounter for immunization: Secondary | ICD-10-CM

## 2019-11-19 NOTE — Progress Notes (Signed)
   Covid-19 Vaccination Clinic  Name:  Hyun Friese    MRN: [id number] DOB: 11-20-1950  11/19/2019  Mr. Sheets was observed post Covid-19 immunization for 15 minutes without incidence. He was provided with Vaccine Information Sheet and instruction to access the V-Safe system.   Mr. Bourne was instructed to call 911 with any severe reactions post vaccine: Marland Kitchen Difficulty breathing  . Swelling of your face and throat  . A fast heartbeat  . A bad rash all over your body  . Dizziness and weakness    Immunizations Administered    Name Date Dose VIS Date Route   Pfizer COVID-19 Vaccine 11/19/2019  2:30 PM 0.3 mL 09/17/2019 Intramuscular   Manufacturer: [place]   Lot: [id number]   [place]: [id number]

## 2019-11-30 ENCOUNTER — Ambulatory Visit: Payer: Medicare Other

## 2020-01-10 ENCOUNTER — Other Ambulatory Visit: Payer: Self-pay

## 2020-01-10 ENCOUNTER — Encounter: Payer: Self-pay | Admitting: Family Medicine

## 2020-01-10 ENCOUNTER — Ambulatory Visit (INDEPENDENT_AMBULATORY_CARE_PROVIDER_SITE_OTHER): Payer: Medicare Other | Admitting: Family Medicine

## 2020-01-10 VITALS — BP 108/68 | HR 59 | Temp 98.0°F | Ht 67.0 in | Wt 129.4 lb

## 2020-01-10 DIAGNOSIS — L719 Rosacea, unspecified: Secondary | ICD-10-CM | POA: Diagnosis not present

## 2020-01-10 DIAGNOSIS — H25013 Cortical age-related cataract, bilateral: Secondary | ICD-10-CM

## 2020-01-10 DIAGNOSIS — L308 Other specified dermatitis: Secondary | ICD-10-CM | POA: Diagnosis not present

## 2020-01-10 DIAGNOSIS — R011 Cardiac murmur, unspecified: Secondary | ICD-10-CM

## 2020-01-10 DIAGNOSIS — E785 Hyperlipidemia, unspecified: Secondary | ICD-10-CM

## 2020-01-10 DIAGNOSIS — K219 Gastro-esophageal reflux disease without esophagitis: Secondary | ICD-10-CM | POA: Diagnosis not present

## 2020-01-10 DIAGNOSIS — J301 Allergic rhinitis due to pollen: Secondary | ICD-10-CM

## 2020-01-10 DIAGNOSIS — D692 Other nonthrombocytopenic purpura: Secondary | ICD-10-CM | POA: Diagnosis not present

## 2020-01-10 DIAGNOSIS — Z8249 Family history of ischemic heart disease and other diseases of the circulatory system: Secondary | ICD-10-CM | POA: Diagnosis not present

## 2020-01-10 DIAGNOSIS — N528 Other male erectile dysfunction: Secondary | ICD-10-CM

## 2020-01-10 DIAGNOSIS — D179 Benign lipomatous neoplasm, unspecified: Secondary | ICD-10-CM

## 2020-01-10 LAB — COMPREHENSIVE METABOLIC PANEL
ALT: 25 IU/L (ref 0–44)
AST: 28 IU/L (ref 0–40)
Albumin/Globulin Ratio: 1.9 (ref 1.2–2.2)
Albumin: 4.3 g/dL (ref 3.8–4.8)
Alkaline Phosphatase: 68 IU/L (ref 39–117)
BUN/Creatinine Ratio: 21 (ref 10–24)
BUN: 19 mg/dL (ref 8–27)
Bilirubin Total: 0.8 mg/dL (ref 0.0–1.2)
CO2: 24 mmol/L (ref 20–29)
Calcium: 9.5 mg/dL (ref 8.6–10.2)
Chloride: 106 mmol/L (ref 96–106)
Creatinine, Ser: 0.9 mg/dL (ref 0.76–1.27)
GFR calc Af Amer: 101 mL/min/{1.73_m2} (ref >59)
GFR calc non Af Amer: 87 mL/min/{1.73_m2} (ref >59)
Globulin, Total: 2.3 g/dL (ref 1.5–4.5)
Glucose: 100 mg/dL — ABNORMAL HIGH (ref 65–99)
Potassium: 4.8 mmol/L (ref 3.5–5.2)
Sodium: 143 mmol/L (ref 134–144)
Total Protein: 6.6 g/dL (ref 6.0–8.5)

## 2020-01-10 LAB — CBC WITH DIFFERENTIAL/PLATELET
Basophils Absolute: 0 10*3/uL (ref 0.0–0.2)
Basos: 1 %
EOS (ABSOLUTE): 0.1 10*3/uL (ref 0.0–0.4)
Eos: 2 %
Hematocrit: 47.2 % (ref 37.5–51.0)
Hemoglobin: 16.3 g/dL (ref 13.0–17.7)
Immature Grans (Abs): 0 10*3/uL (ref 0.0–0.1)
Immature Granulocytes: 0 %
Lymphocytes Absolute: 1.5 10*3/uL (ref 0.7–3.1)
Lymphs: 41 %
MCH: 32.2 pg (ref 26.6–33.0)
MCHC: 34.5 g/dL (ref 31.5–35.7)
MCV: 93 fL (ref 79–97)
Monocytes Absolute: 0.4 10*3/uL (ref 0.1–0.9)
Monocytes: 10 %
Neutrophils Absolute: 1.7 10*3/uL (ref 1.4–7.0)
Neutrophils: 46 %
Platelets: 195 10*3/uL (ref 150–450)
RBC: 5.06 x10E6/uL (ref 4.14–5.80)
RDW: 11.8 % (ref 11.6–15.4)
WBC: 3.7 10*3/uL (ref 3.4–10.8)

## 2020-01-10 LAB — LIPID PANEL
Chol/HDL Ratio: 2.2 ratio (ref 0.0–5.0)
Cholesterol, Total: 136 mg/dL (ref 100–199)
HDL: 62 mg/dL (ref >39)
LDL Chol Calc (NIH): 63 mg/dL (ref 0–99)
Triglycerides: 47 mg/dL (ref 0–149)
VLDL Cholesterol Cal: 11 mg/dL (ref 5–40)

## 2020-01-10 MED ORDER — ATORVASTATIN CALCIUM 20 MG PO TABS: 20.0000 mg | ORAL_TABLET | Freq: Every day | ORAL | 3 refills | Status: DC

## 2020-01-10 MED ORDER — TADALAFIL 20 MG PO TABS: 20.0000 mg | ORAL_TABLET | Freq: Every day | ORAL | 1 refills | Status: AC | PRN

## 2020-01-10 NOTE — Progress Notes (Signed)
Edwin Jimenez is a 69 y.o. male who presents for annual wellness visit and follow-up on chronic medical conditions.  He has underlying seasonal allergies and does use Flonase on an as-needed basis.  He is having no real problems with his reflux symptoms and does occasionally take a PPI.  He also will use a topical for his eczema and again does not use this very often.  He continues on atorvastatin as well as fish oil without aches or pains.  He follows up regularly with ophthalmology occasionally will use of sildenafil.  He does not smoke.  He runs regularly.  He is now retired.  His marriage is going quite well.  Otherwise family and social history was reviewed   Immunizations and Health Maintenance Immunization History  Administered Date(s) Administered  . Fluad Quad(high Dose 65+) 07/14/2019  . Hepatitis A 08/31/2002, 03/09/2003  . Hepatitis B 12/11/2006, 01/13/2007, 06/16/2007  . IPV 04/20/1999  . Influenza, High Dose Seasonal PF 08/04/2017, 07/27/2018  . Influenza,inj,Quad PF,6+ Mos 05/19/2014, 08/01/2015  . Influenza-Unspecified 07/30/2016  . PFIZER SARS-COV-2 Vaccination 10/29/2019, 11/19/2019  . Pneumococcal Conjugate-13 10/22/2016  . Pneumococcal Polysaccharide-23 09/22/2017  . Td 12/09/2006  . Tdap 02/20/2011  . Typhoid Inactivated 02/20/2011  . Zoster 03/10/2013   There are no preventive care reminders to display for this patient.  Last colonoscopy: 11/28/11 Last PSA: 11/18/18 Dentist: Q six months Ophtho: over one year Exercise: runner for a hour QD  Other doctors caring for patient include: Dr. Benson [place] [facility], Dr. Harrell Gave cardiology, Dr. Nicki Reaper eye  Advanced Directives: not on file Does Patient Have a Medical Advance Directive?: Yes Type of Advance Directive: [place] will Does patient want to make changes to medical advance directive?: No - Patient declined Copy of [place] in Chart?: No - copy requestedCopy asked  for  Depression screen:  See questionnaire below.     Depression screen [facility] 2/9 01/10/2020 11/18/2018 09/22/2017 10/22/2016 01/31/2015  Decreased Interest 0 0 0 0 0  Down, Depressed, Hopeless 0 0 0 0 0  PHQ - 2 Score 0 0 0 0 0    Fall Screen: See Questionaire below.   Fall Risk  01/10/2020 05/07/2019 09/22/2017 10/22/2016 01/31/2015  Falls in the past year? 0 0 No No No  Comment - Emmi Telephone Survey: data to providers prior to load - - -  Risk for fall due to : - - - - Other (Comment)    ADL screen:  See questionnaire below.  Functional Status Survey: Is the patient deaf or have difficulty hearing?: No Does the patient have difficulty seeing, even when wearing glasses/contacts?: No Does the patient have difficulty concentrating, remembering, or making decisions?: No Does the patient have difficulty walking or climbing stairs?: No Does the patient have difficulty dressing or bathing?: No Does the patient have difficulty doing errands alone such as visiting a doctor's office or shopping?: No   Review of Systems  Constitutional: -, -unexpected weight change, -anorexia, -fatigue  Dermatology: denies changing moles, rash, lumps  Cardiology:  -chest pain, -palpitations, -orthopnea, Respiratory: -cough, -shortness of breath, -dyspnea on exertion, -wheezing,  Gastroenterology: -abdominal pain, -nausea, -vomiting, -diarrhea, -constipation, -dysphagia Hematology: -bleeding or bruising problems Musculoskeletal: -arthralgias, -myalgias, -joint swelling, -back pain, - Ophthalmology: -vision changes,  Urology: -dysuria, -difficulty urinating,  -urinary frequency, -urgency, incontinence Neurology: -, -numbness, , -memory loss, -falls, -dizziness    PHYSICAL EXAM:  BP 108/68   Pulse (!) 59   Temp 98 F (36.7 C)  Ht 5\' 7"  (1.702 m)   Wt 129 lb 6.4 oz (58.7 kg)   SpO2 97%   BMI 20.27 kg/m   General Appearance: Alert, cooperative, no distress, appears stated age Head: Normocephalic,  without obvious abnormality, atraumatic Eyes: PERRL, conjunctiva/corneas clear, EOM's intact, fundi benign Ears: Normal TM's and external ear canals Nose: Nares normal, mucosa normal, no drainage or sinus   tenderness Throat: Lips, mucosa, and tongue normal; teeth and gums normal Neck: Supple, no lymphadenopathy, thyroid:no enlargement/tenderness/nodules; no carotid bruit or JVD Lungs: Clear to auscultation bilaterally without wheezes, rales or ronchi; respirations unlabored Heart: Regular rate and rhythm, S1 and S2 normal, no murmur, rub or gallop Abdomen: Soft, non-tender, nondistended, normoactive bowel sounds, no masses, no hepatosplenomegaly Extremities: No clubbing, cyanosis or edema Skin: Skin color, texture, turgor normal, purpuric lesions noted on forearms Lymph nodes: Cervical, supraclavicular, and axillary nodes normal Neurologic: CNII-XII intact, normal strength, sensation and gait; reflexes 2+ and symmetric throughout   Psych: Normal mood, affect, hygiene and grooming  ASSESSMENT/PLAN: Hyperlipidemia with target LDL less than 70 - Plan: Lipid Panel, atorvastatin (LIPITOR) 20 MG tablet  Rosacea  Multiple lipomas  Flow murmur  Family history of ischemic heart disease (IHD) - Plan: Lipid Panel, CBC with Differential, Comprehensive metabolic panel  Other male erectile dysfunction - Plan: tadalafil (CIALIS) 20 MG tablet  Other eczema - Plan: CBC with Differential  Cortical age-related cataract of both eyes  Seasonal allergic rhinitis due to pollen  Senile purpura ([place]) - Plan: CBC with Differential  Gastroesophageal reflux disease without esophagitis Continue on present medication regimen.  Immunization recommendations discussed.  Colonoscopy recommendations reviewed.   Medicare Attestation I have personally reviewed: The patient's medical and social history Their use of alcohol, tobacco or illicit drugs Their current medications and supplements The patient's  functional ability including ADLs,fall risks, home safety risks, cognitive, and hearing and visual impairment Diet and physical activities Evidence for depression or mood disorders  The patient's weight, height, and BMI have been recorded in the chart.  I have made referrals, counseling, and provided education to the patient based on review of the above and I have provided the patient with a written personalized care plan for preventive services.     Jill Alexanders, MD   01/10/2020

## 2020-03-01 DIAGNOSIS — L821 Other seborrheic keratosis: Secondary | ICD-10-CM | POA: Diagnosis not present

## 2020-03-01 DIAGNOSIS — Z1283 Encounter for screening for malignant neoplasm of skin: Secondary | ICD-10-CM | POA: Diagnosis not present

## 2020-03-01 DIAGNOSIS — D225 Melanocytic nevi of trunk: Secondary | ICD-10-CM | POA: Diagnosis not present

## 2020-03-01 DIAGNOSIS — X32XXXD Exposure to sunlight, subsequent encounter: Secondary | ICD-10-CM | POA: Diagnosis not present

## 2020-03-01 DIAGNOSIS — L570 Actinic keratosis: Secondary | ICD-10-CM | POA: Diagnosis not present

## 2020-03-01 DIAGNOSIS — D485 Neoplasm of uncertain behavior of skin: Secondary | ICD-10-CM | POA: Diagnosis not present

## 2020-03-21 ENCOUNTER — Encounter: Payer: Self-pay | Admitting: Family Medicine

## 2020-03-29 ENCOUNTER — Encounter: Payer: Self-pay | Admitting: Family Medicine

## 2020-03-29 ENCOUNTER — Other Ambulatory Visit: Payer: Self-pay

## 2020-03-29 ENCOUNTER — Ambulatory Visit (INDEPENDENT_AMBULATORY_CARE_PROVIDER_SITE_OTHER): Payer: Medicare Other | Admitting: Family Medicine

## 2020-03-29 VITALS — BP 108/70 | HR 54 | Temp 98.2°F | Wt 131.8 lb

## 2020-03-29 DIAGNOSIS — K4090 Unilateral inguinal hernia, without obstruction or gangrene, not specified as recurrent: Secondary | ICD-10-CM | POA: Diagnosis not present

## 2020-03-29 NOTE — Progress Notes (Signed)
   Subjective:    Patient ID: Edwin Jimenez, male    DOB: 18-Nov-1950, 69 y.o.   MRN: [id number]  HPI Last week he noted some swelling in the right inguinal area.  He does have a previous history of left inguinal hernia with repair.  He is in the process of starting to prepare for the [facility] marathon.   Review of Systems     Objective:   Physical Exam Inguinal exam does show a hernia present on the right.       Assessment & Plan:  Right inguinal hernia - Plan: Ambulatory referral to General Surgery I explained that the procedure can be done on an as-needed basis but since he is preparing for the [facility], it is worth discussing with the surgeon potential downtime now versus possibly having more trouble while he is trying to prepare for the [facility] marathon.

## 2020-04-20 DIAGNOSIS — Z01818 Encounter for other preprocedural examination: Secondary | ICD-10-CM | POA: Diagnosis not present

## 2020-04-24 DIAGNOSIS — K4090 Unilateral inguinal hernia, without obstruction or gangrene, not specified as recurrent: Secondary | ICD-10-CM | POA: Diagnosis not present

## 2020-05-09 DIAGNOSIS — N451 Epididymitis: Secondary | ICD-10-CM | POA: Diagnosis not present

## 2020-05-09 DIAGNOSIS — R351 Nocturia: Secondary | ICD-10-CM | POA: Diagnosis not present

## 2020-05-16 ENCOUNTER — Other Ambulatory Visit: Payer: Self-pay | Admitting: Urology

## 2020-05-16 DIAGNOSIS — N451 Epididymitis: Secondary | ICD-10-CM

## 2020-05-18 ENCOUNTER — Ambulatory Visit
Admission: RE | Admit: 2020-05-18 | Discharge: 2020-05-18 | Disposition: A | Discharge status: Home / Self Care | Payer: Medicare Other | Source: Ambulatory Visit | Attending: Urology | Admitting: Urology

## 2020-05-18 ENCOUNTER — Other Ambulatory Visit: Payer: Self-pay

## 2020-05-18 DIAGNOSIS — N451 Epididymitis: Secondary | ICD-10-CM

## 2020-05-18 DIAGNOSIS — N503 Cyst of epididymis: Secondary | ICD-10-CM | POA: Diagnosis not present

## 2020-05-18 DIAGNOSIS — N433 Hydrocele, unspecified: Secondary | ICD-10-CM | POA: Diagnosis not present

## 2020-05-18 DIAGNOSIS — I861 Scrotal varices: Secondary | ICD-10-CM | POA: Diagnosis not present

## 2020-05-31 DIAGNOSIS — N4341 Spermatocele of epididymis, single: Secondary | ICD-10-CM | POA: Diagnosis not present

## 2020-05-31 DIAGNOSIS — R351 Nocturia: Secondary | ICD-10-CM | POA: Diagnosis not present

## 2020-06-27 ENCOUNTER — Encounter: Payer: Self-pay | Admitting: Family Medicine

## 2020-07-03 DIAGNOSIS — R3915 Urgency of urination: Secondary | ICD-10-CM | POA: Diagnosis not present

## 2020-07-03 DIAGNOSIS — N4341 Spermatocele of epididymis, single: Secondary | ICD-10-CM | POA: Diagnosis not present

## 2020-07-03 DIAGNOSIS — R351 Nocturia: Secondary | ICD-10-CM | POA: Diagnosis not present

## 2020-07-06 ENCOUNTER — Ambulatory Visit (INDEPENDENT_AMBULATORY_CARE_PROVIDER_SITE_OTHER): Payer: Medicare Other

## 2020-07-06 ENCOUNTER — Other Ambulatory Visit: Payer: Self-pay

## 2020-07-06 DIAGNOSIS — Z23 Encounter for immunization: Secondary | ICD-10-CM

## 2020-08-04 DIAGNOSIS — R3915 Urgency of urination: Secondary | ICD-10-CM | POA: Diagnosis not present

## 2020-08-04 DIAGNOSIS — R351 Nocturia: Secondary | ICD-10-CM | POA: Diagnosis not present

## 2020-08-14 ENCOUNTER — Other Ambulatory Visit (INDEPENDENT_AMBULATORY_CARE_PROVIDER_SITE_OTHER): Payer: Medicare Other

## 2020-08-14 ENCOUNTER — Other Ambulatory Visit: Payer: Self-pay

## 2020-08-14 DIAGNOSIS — Z23 Encounter for immunization: Secondary | ICD-10-CM | POA: Diagnosis not present

## 2020-09-06 DIAGNOSIS — R351 Nocturia: Secondary | ICD-10-CM | POA: Diagnosis not present

## 2020-09-06 DIAGNOSIS — R3915 Urgency of urination: Secondary | ICD-10-CM | POA: Diagnosis not present

## 2020-09-22 DIAGNOSIS — X32XXXD Exposure to sunlight, subsequent encounter: Secondary | ICD-10-CM | POA: Diagnosis not present

## 2020-09-22 DIAGNOSIS — L570 Actinic keratosis: Secondary | ICD-10-CM | POA: Diagnosis not present

## 2020-09-22 DIAGNOSIS — L820 Inflamed seborrheic keratosis: Secondary | ICD-10-CM | POA: Diagnosis not present

## 2020-09-22 DIAGNOSIS — D225 Melanocytic nevi of trunk: Secondary | ICD-10-CM | POA: Diagnosis not present

## 2020-09-22 DIAGNOSIS — Z1283 Encounter for screening for malignant neoplasm of skin: Secondary | ICD-10-CM | POA: Diagnosis not present

## 2020-10-19 DIAGNOSIS — H2511 Age-related nuclear cataract, right eye: Secondary | ICD-10-CM | POA: Diagnosis not present

## 2020-12-22 DIAGNOSIS — Q632 Ectopic kidney: Secondary | ICD-10-CM | POA: Diagnosis not present

## 2020-12-22 DIAGNOSIS — N2889 Other specified disorders of kidney and ureter: Secondary | ICD-10-CM | POA: Diagnosis not present

## 2020-12-22 DIAGNOSIS — R112 Nausea with vomiting, unspecified: Secondary | ICD-10-CM | POA: Diagnosis not present

## 2020-12-22 DIAGNOSIS — N1330 Unspecified hydronephrosis: Secondary | ICD-10-CM | POA: Diagnosis not present

## 2020-12-22 DIAGNOSIS — Z8719 Personal history of other diseases of the digestive system: Secondary | ICD-10-CM | POA: Diagnosis not present

## 2020-12-22 DIAGNOSIS — R1084 Generalized abdominal pain: Secondary | ICD-10-CM | POA: Diagnosis not present

## 2020-12-22 DIAGNOSIS — R1031 Right lower quadrant pain: Secondary | ICD-10-CM | POA: Diagnosis not present

## 2021-01-02 ENCOUNTER — Other Ambulatory Visit: Payer: Self-pay | Admitting: Family Medicine

## 2021-01-02 DIAGNOSIS — R9389 Abnormal findings on diagnostic imaging of other specified body structures: Secondary | ICD-10-CM

## 2021-01-04 DIAGNOSIS — N130 Hydronephrosis with ureteropelvic junction obstruction: Secondary | ICD-10-CM | POA: Diagnosis not present

## 2021-01-08 ENCOUNTER — Other Ambulatory Visit: Payer: Self-pay | Admitting: Urology

## 2021-01-08 ENCOUNTER — Other Ambulatory Visit (HOSPITAL_COMMUNITY): Payer: Self-pay | Admitting: Urology

## 2021-01-08 DIAGNOSIS — N130 Hydronephrosis with ureteropelvic junction obstruction: Secondary | ICD-10-CM

## 2021-01-10 ENCOUNTER — Other Ambulatory Visit: Payer: Self-pay | Admitting: Family Medicine

## 2021-01-10 DIAGNOSIS — R9389 Abnormal findings on diagnostic imaging of other specified body structures: Secondary | ICD-10-CM

## 2021-01-10 NOTE — Progress Notes (Unsigned)
   Subjective:    Patient ID: Edwin Jimenez, male    DOB: 1951/08/29, 70 y.o.   MRN: [id number]  HPI    Review of Systems     Objective:   Physical Exam        Assessment & Plan:

## 2021-01-11 ENCOUNTER — Telehealth: Payer: Self-pay | Admitting: Family Medicine

## 2021-01-11 NOTE — Telephone Encounter (Signed)
Been talking with Santa Lighter with [facility] for the last couple of days.  They will not be able to upload the cd of the CT into Epic.  They can only upload into [employer].  Advised Dr. [employer] of the same.  He advised he wants a Radiologist to look at the CT and give him an interpretation report.  Santa Lighter will begin the process.  Just spoke with Junie Panning in IT at [facility] and gave him further information for the patient.  He is working on process.  CD is now in line for a radiologist to read and will fax [place] the report.

## 2021-01-11 NOTE — Telephone Encounter (Signed)
Check with me tomorrow

## 2021-01-12 ENCOUNTER — Other Ambulatory Visit: Payer: Self-pay | Admitting: Family Medicine

## 2021-01-12 ENCOUNTER — Other Ambulatory Visit: Payer: Self-pay

## 2021-01-12 ENCOUNTER — Encounter: Payer: Self-pay | Admitting: Family Medicine

## 2021-01-12 ENCOUNTER — Ambulatory Visit
Admission: RE | Admit: 2021-01-12 | Discharge: 2021-01-12 | Disposition: A | Discharge status: Home / Self Care | Payer: Medicare Other | Source: Ambulatory Visit | Attending: Family Medicine | Admitting: Family Medicine

## 2021-01-12 DIAGNOSIS — R9389 Abnormal findings on diagnostic imaging of other specified body structures: Secondary | ICD-10-CM

## 2021-01-12 DIAGNOSIS — N281 Cyst of kidney, acquired: Secondary | ICD-10-CM | POA: Diagnosis not present

## 2021-01-12 DIAGNOSIS — Z0389 Encounter for observation for other suspected diseases and conditions ruled out: Secondary | ICD-10-CM | POA: Diagnosis not present

## 2021-01-12 DIAGNOSIS — I2510 Atherosclerotic heart disease of native coronary artery without angina pectoris: Secondary | ICD-10-CM | POA: Diagnosis not present

## 2021-01-12 DIAGNOSIS — J8410 Pulmonary fibrosis, unspecified: Secondary | ICD-10-CM | POA: Diagnosis not present

## 2021-01-12 MED ORDER — IOPAMIDOL (ISOVUE-370) INJECTION 76%
75.0000 mL | Freq: Once | INTRAVENOUS | Status: AC | PRN
  Administered 2021-01-12: 75 mL via INTRAVENOUS

## 2021-01-12 NOTE — Progress Notes (Signed)
He was seen in [facility] ER for evaluation of right lower quadrant pain.  CT scan showed possible pulmonary artery filling defect however follow-up CT scan was not done.  He had no pulmonary symptoms.  A copy of the CT was burned and he brought it back with him.  It took several days working with radiology to finally get it downloaded and have it read.  It was then read as bilateral pulmonary emboli.  Another CT scan was ordered to clear this up and the scan was entirely negative for pulmonary embolus.

## 2021-01-15 NOTE — Telephone Encounter (Signed)
I took care of it 

## 2021-01-15 NOTE — Telephone Encounter (Signed)
Did you receive the overread?

## 2021-01-23 ENCOUNTER — Ambulatory Visit (HOSPITAL_COMMUNITY): Payer: Medicare Other

## 2021-01-24 ENCOUNTER — Ambulatory Visit (HOSPITAL_COMMUNITY)
Admission: RE | Admit: 2021-01-24 | Discharge: 2021-01-24 | Disposition: A | Discharge status: Home / Self Care | Payer: Medicare Other | Source: Ambulatory Visit | Attending: Urology | Admitting: Urology

## 2021-01-24 ENCOUNTER — Other Ambulatory Visit: Payer: Self-pay

## 2021-01-24 DIAGNOSIS — N2889 Other specified disorders of kidney and ureter: Secondary | ICD-10-CM | POA: Diagnosis not present

## 2021-01-24 DIAGNOSIS — N130 Hydronephrosis with ureteropelvic junction obstruction: Secondary | ICD-10-CM | POA: Diagnosis not present

## 2021-01-24 DIAGNOSIS — N19 Unspecified kidney failure: Secondary | ICD-10-CM | POA: Diagnosis not present

## 2021-01-24 MED ORDER — FUROSEMIDE 10 MG/ML IJ SOLN: 30.0000 mg | Freq: Once | INTRAMUSCULAR | Status: AC

## 2021-01-24 MED ORDER — FUROSEMIDE 10 MG/ML IJ SOLN
INTRAMUSCULAR | Status: AC
  Administered 2021-01-24: 30 mg via INTRAVENOUS
  Filled 2021-01-24: qty 4

## 2021-02-09 ENCOUNTER — Other Ambulatory Visit: Payer: Self-pay | Admitting: Family Medicine

## 2021-02-09 DIAGNOSIS — E785 Hyperlipidemia, unspecified: Secondary | ICD-10-CM

## 2021-02-22 ENCOUNTER — Ambulatory Visit
Admission: RE | Admit: 2021-02-22 | Discharge: 2021-02-22 | Disposition: A | Discharge status: Home / Self Care | Payer: Medicare Other | Source: Ambulatory Visit | Attending: Sports Medicine | Admitting: Sports Medicine

## 2021-02-22 ENCOUNTER — Other Ambulatory Visit: Payer: Self-pay

## 2021-02-22 ENCOUNTER — Ambulatory Visit (INDEPENDENT_AMBULATORY_CARE_PROVIDER_SITE_OTHER): Payer: Medicare Other | Admitting: Sports Medicine

## 2021-02-22 VITALS — BP 110/54 | Ht 67.0 in | Wt 133.0 lb

## 2021-02-22 DIAGNOSIS — M79662 Pain in left lower leg: Secondary | ICD-10-CM

## 2021-02-22 DIAGNOSIS — M79661 Pain in right lower leg: Secondary | ICD-10-CM | POA: Diagnosis not present

## 2021-02-22 DIAGNOSIS — M545 Low back pain, unspecified: Secondary | ICD-10-CM | POA: Diagnosis not present

## 2021-02-23 ENCOUNTER — Encounter: Payer: Self-pay | Admitting: Sports Medicine

## 2021-02-23 NOTE — Progress Notes (Signed)
   Subjective:    Patient ID: Edwin Jimenez, male    DOB: 24-Apr-1951, 70 y.o.   MRN: [id number]  HPI chief complaint: Bilateral calf pain  Edwin Jimenez comes in today complaining of bilateral calf pain.  He has a history of reoccurring calf strains/tears.  He has been unable to return to running the way he would like due to these reoccurring injuries.  He most recently experienced right calf pain when running a few weeks ago.  He took some time off and tried to return to running but injured the left calf about 5 miles into a run earlier this week.  He is clearly frustrated by these reoccurring injuries.  He describes a cramping sensation in both calves prior to a sharp stabbing pain.  He localizes his pain to the mid calf region.  He has not noticed any swelling or bruising.  He does limp for several days afterwards but, in general, denies any numbness, tingling, or cramping with simple ambulation.  No low back pain.  He has a 5/16th inch heel lift but it is uncomfortable.  He does use compression sleeves when running and was wearing them earlier this week when he injured his left calf.  Interim medical history reviewed Medications reviewed Allergies reviewed    Review of Systems    As above Objective:   Physical Exam  Well-developed, well-nourished.  No acute distress.  Awake alert and oriented x3.  Vital signs reviewed  Examination of both calves show no swelling.  He is tender to palpation in the mid calf region of the left calf but no palpable defect.  There is hair loss from the distal third of the tibia down in both legs.  Good pulses.  Good color.  Walking with a slight limp.  MSK ultrasound of both calves was performed.  Right calf appears fairly unremarkable.  Do not see any evidence of injury.  Left calf shows diffuse scar tissue and disorganization of the medial head of the gastroc but it is difficult to tell if this is chronic or acute.      Assessment & Plan:  Reoccurring calf  cramping and injury-rule out peripheral vascular disease  Although this would be an atypical presentation, peripheral vascular disease is in the differential.  Given the distal hair loss in both lower legs I would like to get ABIs to rule this out.  There may also be a neurogenic component to his injuries so I would like to get an x-ray of his lumbar spine.  Phone follow-up with the results of both of those studies when available.  In the meantime I have given him a smaller 3/16th inch heel lift to wear in his running shoes.  He has a good understanding of home exercises to rehabilitate this injury.  I do think he can increase activity as tolerated.  I have also recommended that he continue crosstraining on his spin bike.

## 2021-02-26 ENCOUNTER — Ambulatory Visit (HOSPITAL_COMMUNITY): Payer: Medicare Other

## 2021-02-27 ENCOUNTER — Ambulatory Visit (HOSPITAL_COMMUNITY)
Admission: RE | Admit: 2021-02-27 | Discharge: 2021-02-27 | Disposition: A | Discharge status: Home / Self Care | Payer: Medicare Other | Source: Ambulatory Visit | Attending: Sports Medicine | Admitting: Sports Medicine

## 2021-02-27 ENCOUNTER — Other Ambulatory Visit: Payer: Self-pay

## 2021-02-27 DIAGNOSIS — M79662 Pain in left lower leg: Secondary | ICD-10-CM

## 2021-02-27 DIAGNOSIS — M79661 Pain in right lower leg: Secondary | ICD-10-CM | POA: Diagnosis not present

## 2021-02-27 NOTE — Progress Notes (Signed)
ABI's have been completed. Preliminary results can be found in CV Proc through chart review.   02/27/21 3:00 PM Edwin Jimenez RVT

## 2021-03-01 ENCOUNTER — Telehealth: Payer: Self-pay | Admitting: Sports Medicine

## 2021-03-01 NOTE — Telephone Encounter (Signed)
  I spoke with Edwin Jimenez on the phone today after I reviewed his lumbar spine x-rays and his ABIs.  His lumbar spine shows some mild spondylosis but not severe.  His ABIs are within normal limits.  I discussed his case with Dr. Oneida Alar who recommends that [facility] consider custom orthotics for the extra cushioning that they may provide.  I also spoke with Edwin Jimenez about the importance of eccentric heel drops. It sounds like he may be doing them incorrectly.  I once again reiterated to him the importance of concentrating on the eccentric portion of the exercise, not the concentric.  He would like to proceed with custom orthotics so he will schedule an appointment with me at his convenience.  In the meantime, I think he may slowly return to running with his 3/16 inch heel lifts in his shoes and using pain as his guide.

## 2021-03-06 ENCOUNTER — Ambulatory Visit (INDEPENDENT_AMBULATORY_CARE_PROVIDER_SITE_OTHER): Payer: Medicare Other | Admitting: Sports Medicine

## 2021-03-06 ENCOUNTER — Other Ambulatory Visit: Payer: Self-pay

## 2021-03-06 VITALS — BP 108/62 | Ht 67.0 in | Wt 133.0 lb

## 2021-03-06 DIAGNOSIS — M79662 Pain in left lower leg: Secondary | ICD-10-CM

## 2021-03-06 DIAGNOSIS — M79661 Pain in right lower leg: Secondary | ICD-10-CM

## 2021-03-06 NOTE — Patient Instructions (Signed)
One-week follow-up -- Any patient with a severe gastrocnemius strain should follow up within one week. A gastrocnemius strain is considered severe if the patient cannot walk (ie, requires crutches), or the injury is associated with marked swelling or severe pain. At the first follow-up visit, the clinician determines if swelling has subsided and can repeat an ultrasound examination to assess the size of the tear and any hematoma. Identification of a significant hematoma on ultrasound indicates a need to continue regular icing and use of a compression sleeve (picture 7), and to use a more gradual timetable for introducing full weight-bearing and rehabilitation exercises.  Additional interventions depend upon the patient's symptoms and functional capacity. If possible, the patient should stop using crutches. Once they can walk without a significant limp, the patient should start taking walks three times each day for 10 minutes at a time. If they can perform a heel raise standing on both legs without undue pain, the patient should start performing this exercise regularly, starting with a single set of five to eight repetitions performed with the knees bent, and then repeating this with the knees straight. Gradually, the patient may increase the number of sets and repetitions, assuming these can be performed pain-free, with the ultimate goal of being able to perform three sets of 15 repetitions with the knees straight, and then repeating this with the knees bent. The patient should continue wearing a compression sleeve on the affected calf, using heel lifts, and applying ice four times per day for 20 minutes at a time until swelling subsides.  Stretching exercises do not aid healing and should be avoided during rehabilitation; stretching can exacerbate the muscle tears sustained at the time of injury.  Three-week follow-up -- If the patient demonstrates steady improvement in symptoms and function at the  three-week follow-up visit, they should begin performing the Alfredson protocol for Achilles tendon injury. This protocol is described in detail separately and summarized in the accompanying table (table 1). (See "Achilles tendinopathy and tendon rupture", section on 'Rehabilitation using resistance exercise'.)  When the patient is able to perform eccentric heel drops for three sets of 15 repetitions on one leg, he or she may begin easy running every other day on a level surface, not to exceed 15 to 20 minutes per session. Additional aerobic exercise can be done on a bicycle or stationary bicycle. Knowledgeable clinicians can monitor a patient's clinical progress with ultrasound.  Six-week follow-up -- If the patient continues to demonstrate meaningful progress at the six-week follow-up visit, allow them to start running for longer times and distances, but still on a level surface. As a rule of thumb, increases in running volume should not exceed five minutes per session during a given week, and no more than one additional running day should be added each week. As an example, if the patient ran three times for 20 minutes each session during week one, they could increase this to four times for 25 minutes each session during week two.  The patient should continue to perform heel raise exercises as per the final stage of the Alfredson protocol. Ultrasound can be used to establish if the injury is fully healed. Be sure to observe the patient's running gait to be sure there is no limp or other sign of discomfort. In most cases, 8 to 12 weeks are required for the patient to return to full sports activity. The patient should continue to use heel lifts and compression sleeves during activity for the first three months  after their return. Thereafter, we leave it up to the patient whether they wish

## 2021-03-06 NOTE — Progress Notes (Signed)
Patient ID: Edwin Jimenez, male   DOB: 1950-10-10, 70 y.o.   MRN: [id number]   Edwin Jimenez comes in today for custom orthotics.  Please see his previous office notes for details regarding history and physical exam findings.  In short, Edwin Jimenez continues to struggle with reoccurring calf strains.  In fact, he reinjured his calf over the weekend.  Previous ultrasound showed scar tissue in the left calf but no evidence of acute calf injury in either calf.  He has been using compression and performing Alfredson heel drop exercises but these exercises are painful.  Custom orthotics were created for [facility] as below.  He found them to be comfortable prior to leaving the office.  I think it would be wise for [place] to shut him down from running for a while.  We will follow the up-to-date return to running protocol for calf injuries.  This will allow him to do some very light walking when he is able to ambulate pain-free.  He will also substitute simple calf raises off of a level ground for his Alfredson exercises.  He will follow-up with me again in 2 weeks for a check on his progress.  It is going to be a while before he is able to return to recreational running.  I think he is okay to bike or use a rowing machine as long as pain allows.  Patient was fitted for a : standard, cushioned, semi-rigid orthotic. The orthotic was heated and afterward the patient stood on the orthotic blank positioned on the orthotic stand. The patient was positioned in subtalar neutral position and 10 degrees of ankle dorsiflexion in a weight bearing stance. After completion of molding, a stable base was applied to the orthotic blank. The blank was ground to a stable position for weight bearing. Size: 8 Base: Blue EVA Posting: None Additional orthotic padding: None

## 2021-03-13 ENCOUNTER — Ambulatory Visit: Payer: Medicare Other | Admitting: Sports Medicine

## 2021-03-22 ENCOUNTER — Ambulatory Visit (INDEPENDENT_AMBULATORY_CARE_PROVIDER_SITE_OTHER): Payer: Medicare Other | Admitting: Sports Medicine

## 2021-03-22 ENCOUNTER — Other Ambulatory Visit: Payer: Self-pay

## 2021-03-22 VITALS — BP 96/60 | Ht 67.0 in | Wt 133.0 lb

## 2021-03-22 DIAGNOSIS — S86819D Strain of other muscle(s) and tendon(s) at lower leg level, unspecified leg, subsequent encounter: Secondary | ICD-10-CM | POA: Diagnosis not present

## 2021-03-22 NOTE — Patient Instructions (Signed)
One-week follow-up -- Any patient with a severe gastrocnemius strain should follow up within one week. A gastrocnemius strain is considered severe if the patient cannot walk (ie, requires crutches), or the injury is associated with marked swelling or severe pain. At the first follow-up visit, the clinician determines if swelling has subsided and can repeat an ultrasound examination to assess the size of the tear and any hematoma. Identification of a significant hematoma on ultrasound indicates a need to continue regular icing and use of a compression sleeve (picture 7), and to use a more gradual timetable for introducing full weight-bearing and rehabilitation exercises.  Additional interventions depend upon the patient's symptoms and functional capacity. If possible, the patient should stop using crutches. Once they can walk without a significant limp, the patient should start taking walks three times each day for 10 minutes at a time. If they can perform a heel raise standing on both legs without undue pain, the patient should start performing this exercise regularly, starting with a single set of five to eight repetitions performed with the knees bent, and then repeating this with the knees straight. Gradually, the patient may increase the number of sets and repetitions, assuming these can be performed pain-free, with the ultimate goal of being able to perform three sets of 15 repetitions with the knees straight, and then repeating this with the knees bent. The patient should continue wearing a compression sleeve on the affected calf, using heel lifts, and applying ice four times per day for 20 minutes at a time until swelling subsides.  Stretching exercises do not aid healing and should be avoided during rehabilitation; stretching can exacerbate the muscle tears sustained at the time of injury.  Three-week follow-up -- If the patient demonstrates steady improvement in symptoms and function at the  three-week follow-up visit, they should begin performing the Alfredson protocol for Achilles tendon injury. This protocol is described in detail separately and summarized in the accompanying table (table 1). (See "Achilles tendinopathy and tendon rupture", section on 'Rehabilitation using resistance exercise'.)  When the patient is able to perform eccentric heel drops for three sets of 15 repetitions on one leg, he or she may begin easy running every other day on a level surface, not to exceed 15 to 20 minutes per session. Additional aerobic exercise can be done on a bicycle or stationary bicycle. Knowledgeable clinicians can monitor a patient's clinical progress with ultrasound.  Six-week follow-up -- If the patient continues to demonstrate meaningful progress at the six-week follow-up visit, allow them to start running for longer times and distances, but still on a level surface. As a rule of thumb, increases in running volume should not exceed five minutes per session during a given week, and no more than one additional running day should be added each week. As an example, if the patient ran three times for 20 minutes each session during week one, they could increase this to four times for 25 minutes each session during week two.  The patient should continue to perform heel raise exercises as per the final stage of the Alfredson protocol. Ultrasound can be used to establish if the injury is fully healed. Be sure to observe the patient's running gait to be sure there is no limp or other sign of discomfort. In most cases, 8 to 12 weeks are required for the patient to return to full sports activity. The patient should continue to use heel lifts and compression sleeves during activity for the first three months  after their return. Thereafter, we leave it up to the patient whether they wish to continue using them.

## 2021-03-23 NOTE — Progress Notes (Signed)
Patient ID: Edwin Jimenez, male   DOB: Feb 11, 1951, 70 y.o.   MRN: [id number]  [name] comes in today for follow-up on left calf strain.  Right calf is feeling better.  He has not been running.  He has been exercising on his spin bike as well as walking his dog for about 30 minutes at a time.  He does still feel a "twinge" when walking.  He is able to do heel lifts without pain. He is anxious to return to running but is concerned about reinjury.  Physical exam today still show some tenderness to palpation in the midportion of the left calf.  No palpable defect.  No swelling. Limited MSK ultrasound of the left calf once again shows scar tissue in the area of pain but no new tearing.  I printed a copy of the up-to-date return to running protocol for calf injuries for [name] to follow.  I educated him about the proper way to do Alfredson heel drop exercises and explained to him that if he is having pain with this that he is not ready to return to running yet.  I encouraged him to try to be patient as these types of injuries are notorious for healing slowly.  He has a body helix compression sleeve as well as new custom orthotics.  He also has heel lifts that he can use.  I think at this point he can progress per the protocol as his symptoms allow.  If he continues to struggle we could consider a referral for physical therapy.  Follow-up as needed.

## 2021-04-18 DIAGNOSIS — D1722 Benign lipomatous neoplasm of skin and subcutaneous tissue of left arm: Secondary | ICD-10-CM | POA: Diagnosis not present

## 2021-04-18 DIAGNOSIS — L570 Actinic keratosis: Secondary | ICD-10-CM | POA: Diagnosis not present

## 2021-04-18 DIAGNOSIS — L820 Inflamed seborrheic keratosis: Secondary | ICD-10-CM | POA: Diagnosis not present

## 2021-04-18 DIAGNOSIS — X32XXXD Exposure to sunlight, subsequent encounter: Secondary | ICD-10-CM | POA: Diagnosis not present

## 2021-04-18 DIAGNOSIS — Z1283 Encounter for screening for malignant neoplasm of skin: Secondary | ICD-10-CM | POA: Diagnosis not present

## 2021-05-15 ENCOUNTER — Encounter: Payer: Self-pay | Admitting: Family Medicine

## 2021-05-15 ENCOUNTER — Ambulatory Visit (INDEPENDENT_AMBULATORY_CARE_PROVIDER_SITE_OTHER): Payer: Medicare Other | Admitting: Family Medicine

## 2021-05-15 VITALS — BP 102/64 | HR 64 | Temp 97.5°F | Wt 132.6 lb

## 2021-05-15 DIAGNOSIS — J029 Acute pharyngitis, unspecified: Secondary | ICD-10-CM

## 2021-05-15 MED ORDER — AZITHROMYCIN 500 MG PO TABS: 500.0000 mg | ORAL_TABLET | Freq: Every day | ORAL | 0 refills | Status: DC

## 2021-05-15 NOTE — Progress Notes (Signed)
   Subjective:    Patient ID: Edwin Jimenez, male    DOB: April 17, 1951, 70 y.o.   MRN: [id number]  HPI He states that he has had a 2-week history of sore throat and malaise but no earache fever, chills, cough or congestion.  He is tested several times for COVID and all of them have been negative.   Review of Systems     Objective:   Physical Exam Alert and in no distress. Tympanic membranes and canals are normal. Pharyngeal area is normal. Neck is supple without adenopathy or thyromegaly. Cardiac exam shows a regular sinus rhythm without murmurs or gallops. Lungs are clear to auscultation. Abdominal exam shows no masses or tenderness.  No axillary or inguinal adenopathy noted.       Assessment & Plan:  Sore throat - Plan: azithromycin (ZITHROMAX) 500 MG tablet After discussion with him, I will go ahead and treat presently doing a strep screen.  He is comfortable with that and if the symptoms do not go away he is to come back in for further blood work and evaluation.

## 2021-06-28 ENCOUNTER — Other Ambulatory Visit (INDEPENDENT_AMBULATORY_CARE_PROVIDER_SITE_OTHER): Payer: Medicare Other

## 2021-06-28 ENCOUNTER — Other Ambulatory Visit: Payer: Medicare Other

## 2021-06-28 ENCOUNTER — Other Ambulatory Visit: Payer: Self-pay

## 2021-06-28 DIAGNOSIS — Z23 Encounter for immunization: Secondary | ICD-10-CM | POA: Diagnosis not present

## 2021-07-02 ENCOUNTER — Encounter: Payer: Self-pay | Admitting: Family Medicine

## 2021-07-17 ENCOUNTER — Ambulatory Visit (HOSPITAL_BASED_OUTPATIENT_CLINIC_OR_DEPARTMENT_OTHER): Payer: Medicare Other | Admitting: Cardiology

## 2021-07-27 ENCOUNTER — Encounter: Payer: Self-pay | Admitting: Family Medicine

## 2021-07-28 MED ORDER — AMOXICILLIN-POT CLAVULANATE 875-125 MG PO TABS: 1.0000 | ORAL_TABLET | Freq: Two times a day (BID) | ORAL | 0 refills | Status: DC

## 2021-07-31 ENCOUNTER — Encounter (HOSPITAL_BASED_OUTPATIENT_CLINIC_OR_DEPARTMENT_OTHER): Payer: Self-pay | Admitting: Cardiology

## 2021-07-31 ENCOUNTER — Ambulatory Visit (INDEPENDENT_AMBULATORY_CARE_PROVIDER_SITE_OTHER): Payer: Medicare Other | Admitting: Cardiology

## 2021-07-31 ENCOUNTER — Other Ambulatory Visit: Payer: Self-pay

## 2021-07-31 VITALS — BP 124/76 | HR 56 | Ht 67.5 in | Wt 131.6 lb

## 2021-07-31 DIAGNOSIS — Z8249 Family history of ischemic heart disease and other diseases of the circulatory system: Secondary | ICD-10-CM | POA: Diagnosis not present

## 2021-07-31 DIAGNOSIS — Z7189 Other specified counseling: Secondary | ICD-10-CM

## 2021-07-31 DIAGNOSIS — I2510 Atherosclerotic heart disease of native coronary artery without angina pectoris: Secondary | ICD-10-CM

## 2021-07-31 DIAGNOSIS — Z79899 Other long term (current) drug therapy: Secondary | ICD-10-CM | POA: Diagnosis not present

## 2021-07-31 DIAGNOSIS — R011 Cardiac murmur, unspecified: Secondary | ICD-10-CM | POA: Diagnosis not present

## 2021-07-31 LAB — LIPID PANEL
Chol/HDL Ratio: 3.1 ratio (ref 0.0–5.0)
Cholesterol, Total: 149 mg/dL (ref 100–199)
HDL: 48 mg/dL (ref >39)
LDL Chol Calc (NIH): 90 mg/dL (ref 0–99)
Triglycerides: 53 mg/dL (ref 0–149)
VLDL Cholesterol Cal: 11 mg/dL (ref 5–40)

## 2021-07-31 NOTE — Progress Notes (Signed)
Cardiology Office Note:    Date:  07/31/2021   ID:  Edwin Jimenez, DOB 1950/12/06, MRN [id number]  PCP:  Denita Lung, MD  Cardiologist:  Buford Dresser, MD PhD  Referring MD: Denita Lung, MD   CC: follow up  History of Present Illness:    Edwin Jimenez is a 70 y.o. male with a hx of hyperlipidemia, family history of CAD who is seen as a follow up at the request of Denita Lung, MD for the evaluation and management of murmur and prevention strategy. Last visit with Almyra Deforest on 04/30/18.  Today: Overall he is feeling fine aside from a current sinus infection, being treated with Augmentin.  He has been suffering from worsening orthostatic lightheadedness and dizziness. Typically needs to steady himself for a few seconds until his symptoms resolve.   This past year he has not been able to run for exercise as often as he did previously, mostly due to physical injuries and not cardiovascular symptoms.   Endorses occasional LE edema in his feet by the end of the day, especially while flying in an airplane. Usually his swelling improves by the next morning.  He denies any palpitations, chest pain, or shortness of breath. No headaches, syncope, orthopnea, PND, or exertional symptoms. He is not fasting today.  Recently returned from a trip to [place].   Past Medical History:  Diagnosis Date   Dyslipidemia    ED (erectile dysfunction)    Hyperlipidemia     Past Surgical History:  Procedure Laterality Date   COLONOSCOPY     x2   EXCISION MASS ABDOMINAL Left    angiolipoma   HERNIA REPAIR  06/16/12   [facility]   INGUINAL HERNIA REPAIR  06/16/2012   Procedure: HERNIA REPAIR INGUINAL ADULT;  Surgeon: Pedro Earls, MD;  Location: [place];  Service: General;  Laterality: Left;  left inguinal hernia repair with mesh    Current Medications: Current Outpatient Medications on File Prior to Visit  Medication Sig   amoxicillin-clavulanate  (AUGMENTIN) 875-125 MG tablet Take 1 tablet by mouth 2 (two) times daily.   atorvastatin (LIPITOR) 20 MG tablet TAKE 1 TABLET DAILY   augmented betamethasone dipropionate (DIPROLENE-AF) 0.05 % cream Apply 1 application topically as needed.   Azelaic Acid 15 % gel Apply topically as needed. After skin is thoroughly washed and patted dry, gently but thoroughly massage a thin film of azelaic acid cream into the affected area twice daily, in the morning and evening.   Coenzyme Q10 (CO Q10) 200 MG CAPS Take 1 capsule by mouth daily.   fish oil-omega-3 fatty acids 1000 MG capsule Take 2 g by mouth daily.     tadalafil (CIALIS) 20 MG tablet Take 1 tablet (20 mg total) by mouth daily as needed for erectile dysfunction.   finasteride (PROSCAR) 5 MG tablet Take 5 mg by mouth daily.   No current facility-administered medications on file prior to visit.     Allergies:   Patient has no known allergies.   Social History   Socioeconomic History   Marital status: Married    Spouse name: Not on file   Number of children: Not on file   Years of education: Not on file   Highest education level: Not on file  Occupational History   Not on file  Tobacco Use   Smoking status: Never   Smokeless tobacco: Never  Vaping Use   Vaping Use: Never used  Substance and Sexual  Activity   Alcohol use: Yes    Comment: wine of weekends   Drug use: No   Sexual activity: Yes  Other Topics Concern   Not on file  Social History Narrative   Not on file   Social Determinants of Health   Financial Resource Strain: Not on file  Food Insecurity: Not on file  Transportation Needs: Not on file  Physical Activity: Not on file  Stress: Not on file  Social Connections: Not on file     Family History: The patient's family history includes Diabetes in his sister; Heart disease (age of onset: 67) in his father; Stroke in his mother; Transient ischemic attack in his father. There is no history of Hypertension. All of the  men on his father's side died of Mis in adulthood. No early onset CAD.   ROS:   Please see the history of present illness.  Additional pertinent ROS otherwise unremarkable.  EKGs/Labs/Other Studies Reviewed:    The following studies were reviewed today:  ABI 02/27/2021: Summary:  Right: Resting right ankle-brachial index is within normal range. No  evidence of significant right lower extremity arterial disease.   Left: Resting left ankle-brachial index is within normal range. No  evidence of significant left lower extremity arterial disease.   CTA Chest 01/12/2021: COMPARISON:  Cardiac CT examination including portions of pulmonary arteries and lung parenchyma December 22, 2020   FINDINGS: Cardiovascular: On current examination, pulmonary arterial vessels are well opacified without evident pulmonary embolus. There is no thoracic aortic aneurysm or dissection. Visualized great vessels appear unremarkable. There are a few small foci of coronary artery calcification. No pericardial effusion or pericardial thickening evident.   Mediastinum/Nodes: Thyroid appears normal. No evident thoracic adenopathy. No evident hiatal hernia. Note that there is a focal area of air in the upper thoracic region to the right of the esophagus and posterior to the trachea, measuring 1.0 x 1.0 cm. No findings indicative of pneumomediastinum.   Lungs/Pleura: There is a calcified granuloma in the right lower lobe. Lungs otherwise are clear. No evident pleural effusions. No pneumothorax. Trachea and major bronchial structures appear patent.   Upper Abdomen: There is a cyst arising from the right kidney posteriorly measuring 3.0 x 3.0 cm. Visualized upper abdominal structures otherwise appear unremarkable.   Musculoskeletal: No blastic or lytic bone lesions. No evident chest wall lesions.   Review of the MIP images confirms the above findings.   IMPRESSION: 1. No evident pulmonary embolus. No thoracic  aortic aneurysm or dissection. Occasional foci of coronary artery calcification noted.   2.  Calcified granuloma right lower lobe.  Lungs otherwise clear.   3. 1 x 1 cm focus of air immediately to the right of the esophagus in the upper thoracic region at the T3 level. Question air containing esophageal diverticulum in this area. Nonemergent barium swallow could confirm. No findings felt to represent pneumomediastinum. No pneumothorax. This focus of air appears separate from the nearby lung.   4.  No evident thoracic adenopathy.  Calcium score 08/11/2018 Calcium score 14.6.  Likely benign calcified nodule also noted, seen on prior CXR. See result note, communicated to patient, recommended PCP follow up.  Echo 7.29.19 Study Conclusions   - Left ventricle: The cavity size was normal. Wall thickness was   normal. Systolic function was normal. The estimated ejection   fraction was in the range of 55% to 60%. Wall motion was normal;   there were no regional wall motion abnormalities. Doppler   parameters  are consistent with abnormal left ventricular   relaxation (grade 1 diastolic dysfunction).   Impressions:   - Normal LV systolic function; mild diastolic dysfunction;   sclerotic aortic valve.  Reviewed CTPE report from [facility] as well.   EKG:  ECG personally reviewed.  07/31/2021: sinus bradycardia at 55 bpm, TWI unchanged from prior 07/13/2019: sinus bradycardia at 56 bpm, TWI in III/avf with flat T waves in II. Similar to prior.  Recent Labs: No results found for requested labs within last 8760 hours.  Recent Lipid Panel    Component Value Date/Time   CHOL 136 01/10/2020 1024   TRIG 47 01/10/2020 1024   HDL 62 01/10/2020 1024   CHOLHDL 2.2 01/10/2020 1024   CHOLHDL 2.2 09/22/2017 1111   VLDL 8 10/22/2016 0943   LDLCALC 63 01/10/2020 1024   LDLCALC 62 09/22/2017 1111    Physical Exam:    Vitals:   07/31/21 1007  BP: 124/76  Pulse: (!) 56  SpO2: 98%      Wt Readings from Last 3 Encounters:  07/31/21 131 lb 9.6 oz (59.7 kg)  05/15/21 132 lb 9.6 oz (60.1 kg)  03/22/21 133 lb (60.3 kg)    GEN: Well nourished, well developed in no acute distress HEENT: Normal, moist mucous membranes NECK: No JVD CARDIAC: regular rhythm, normal S1 and S2, no rubs or gallops. 1/6 systolic murmur. VASCULAR: Radial and DP pulses 2+ bilaterally. No carotid bruits RESPIRATORY:  Clear to auscultation without rales, wheezing or rhonchi  ABDOMEN: Soft, non-tender, non-distended MUSCULOSKELETAL:  Ambulates independently SKIN: Warm and dry, no edema NEUROLOGIC:  Alert and oriented x 3. No focal neuro deficits noted. PSYCHIATRIC:  Normal affect   ASSESSMENT:    1. Coronary artery calcification seen on CT scan   2. Flow murmur   3. Medication management   4. Family history of ischemic heart disease (IHD)   5. Cardiac risk counseling   6. Counseling on health promotion and disease prevention     PLAN:    Soft murmur: previously noted, echo without significant valve abnormality (murmur not consistent with aortic sclerosis).   Orthostasis: no syncope, reported history of symptoms. Discussed gradual position changes.  CV risk and Prevention:  -Recommend continued excellent lifestyle.  -calcium score 14, consistent with nonobstructive CAD -personal history of hyperlipidemia,Coronary calcium on CT, family history of CAD.  -LDL has had excellent control, recheck today (not fasting but TG have been fine) -continue atorvastatin -have discussed data re: OTC fish oil and CV disease, but ok to continue per personal preference  Plan for follow up: 2 years  Medication Adjustments/Labs and Tests Ordered: Current medicines are reviewed at length with the patient today.  Concerns regarding medicines are outlined above.  Orders Placed This Encounter  Procedures   Lipid panel   EKG 12-Lead    No orders of the defined types were placed in this  encounter.   Patient Instructions  Medication Instructions:  Your Physician recommend you continue on your current medication as directed.    *If you need a refill on your cardiac medications before your next appointment, please call your pharmacy*   Lab Work: Lipid Panel on 3rd floor  If you have labs (blood work) drawn today and your tests are completely normal, you will receive your results only by: [place] (if you have MyChart) OR A paper copy in the mail If you have any lab test that is abnormal or we need to change your treatment, we will call you to  review the results.   Testing/Procedures: None ordered   Follow-Up: At [facility], you and your health needs are our priority.  As part of our continuing mission to provide you with exceptional heart care, we have created designated Provider Care Teams.  These Care Teams include your primary Cardiologist (physician) and Advanced Practice Providers (APPs -  Physician Assistants and Nurse Practitioners) who all work together to provide you with the care you need, when you need it.  We recommend signing up for the patient portal called "MyChart".  Sign up information is provided on this After Visit Summary.  MyChart is used to connect with patients for Virtual Visits (Telemedicine).  Patients are able to view lab/test results, encounter notes, upcoming appointments, etc.  Non-urgent messages can be sent to your provider as well.   To learn more about what you can do with MyChart, go to [email or web address].    Your next appointment:   2 year(s)  The format for your next appointment:   In Person  Provider:   Buford Dresser, MD   Other Instructions    I,Mathew Stumpf,acting as a scribe for Buford Dresser, MD.,have documented all relevant documentation on the behalf of Buford Dresser, MD,as directed by  Buford Dresser, MD while in the presence of Buford Dresser, MD.  I,  Buford Dresser, MD, have reviewed all documentation for this visit. The documentation on 07/31/21 for the exam, diagnosis, procedures, and orders are all accurate and complete.   Signed, Buford Dresser, MD PhD 07/31/2021  [place]

## 2021-07-31 NOTE — Patient Instructions (Signed)
Medication Instructions:  Your Physician recommend you continue on your current medication as directed.    *If you need a refill on your cardiac medications before your next appointment, please call your pharmacy*   Lab Work: Lipid Panel on 3rd floor  If you have labs (blood work) drawn today and your tests are completely normal, you will receive your results only by: [place] (if you have MyChart) OR A paper copy in the mail If you have any lab test that is abnormal or we need to change your treatment, we will call you to review the results.   Testing/Procedures: None ordered   Follow-Up: At [facility], you and your health needs are our priority.  As part of our continuing mission to provide you with exceptional heart care, we have created designated Provider Care Teams.  These Care Teams include your primary Cardiologist (physician) and Advanced Practice Providers (APPs -  Physician Assistants and Nurse Practitioners) who all work together to provide you with the care you need, when you need it.  We recommend signing up for the patient portal called "MyChart".  Sign up information is provided on this After Visit Summary.  MyChart is used to connect with patients for Virtual Visits (Telemedicine).  Patients are able to view lab/test results, encounter notes, upcoming appointments, etc.  Non-urgent messages can be sent to your provider as well.   To learn more about what you can do with MyChart, go to [email or web address].    Your next appointment:   2 year(s)  The format for your next appointment:   In Person  Provider:   Buford Dresser, MD   Other Instructions

## 2021-08-01 ENCOUNTER — Other Ambulatory Visit: Payer: Self-pay | Admitting: Family Medicine

## 2021-08-01 DIAGNOSIS — E785 Hyperlipidemia, unspecified: Secondary | ICD-10-CM

## 2021-08-17 ENCOUNTER — Other Ambulatory Visit: Payer: Self-pay

## 2021-08-17 ENCOUNTER — Ambulatory Visit (INDEPENDENT_AMBULATORY_CARE_PROVIDER_SITE_OTHER): Payer: Medicare Other | Admitting: Family Medicine

## 2021-08-17 ENCOUNTER — Encounter: Payer: Self-pay | Admitting: Family Medicine

## 2021-08-17 VITALS — BP 112/76 | HR 60 | Ht 67.5 in | Wt 134.0 lb

## 2021-08-17 DIAGNOSIS — H6123 Impacted cerumen, bilateral: Secondary | ICD-10-CM

## 2021-08-17 DIAGNOSIS — I2510 Atherosclerotic heart disease of native coronary artery without angina pectoris: Secondary | ICD-10-CM | POA: Diagnosis not present

## 2021-08-17 NOTE — Progress Notes (Signed)
   Subjective:    Patient ID: Edwin Jimenez, male    DOB: Jul 12, 1951, 70 y.o.   MRN: [id number]  HPI He is here for consult concerning difficulty hearing bilaterally.  He feels as if he has cerumen impaction.  No earache, sore throat.   Review of Systems     Objective:   Physical Exam Initial exam of the ears does show cerumen impaction.  After lavage the tympanic membranes and canals are normal.       Assessment & Plan:  Bilateral impacted cerumen

## 2021-08-21 ENCOUNTER — Encounter: Payer: Self-pay | Admitting: Family Medicine

## 2021-10-09 ENCOUNTER — Encounter: Payer: Self-pay | Admitting: Family Medicine

## 2021-11-01 ENCOUNTER — Encounter: Payer: Self-pay | Admitting: Family Medicine

## 2021-11-01 ENCOUNTER — Other Ambulatory Visit: Payer: Self-pay

## 2021-11-01 ENCOUNTER — Ambulatory Visit: Payer: Medicare Other | Admitting: Family Medicine

## 2021-11-01 VITALS — BP 134/80 | HR 70 | Temp 97.0°F | Wt 130.8 lb

## 2021-11-01 DIAGNOSIS — R3989 Other symptoms and signs involving the genitourinary system: Secondary | ICD-10-CM | POA: Diagnosis not present

## 2021-11-01 DIAGNOSIS — J0101 Acute recurrent maxillary sinusitis: Secondary | ICD-10-CM | POA: Diagnosis not present

## 2021-11-01 DIAGNOSIS — R310 Gross hematuria: Secondary | ICD-10-CM

## 2021-11-01 DIAGNOSIS — R351 Nocturia: Secondary | ICD-10-CM

## 2021-11-01 DIAGNOSIS — N401 Enlarged prostate with lower urinary tract symptoms: Secondary | ICD-10-CM

## 2021-11-01 LAB — POCT URINALYSIS DIP (PROADVANTAGE DEVICE)
Bilirubin, UA: NEGATIVE
Glucose, UA: NEGATIVE mg/dL
Ketones, POC UA: NEGATIVE mg/dL
Leukocytes, UA: NEGATIVE
Nitrite, UA: NEGATIVE
Protein Ur, POC: NEGATIVE mg/dL
Specific Gravity, Urine: 1.02
Urobilinogen, Ur: 0.2
pH, UA: 6 (ref 5.0–8.0)

## 2021-11-01 MED ORDER — AMOXICILLIN-POT CLAVULANATE 875-125 MG PO TABS: 1.0000 | ORAL_TABLET | Freq: Two times a day (BID) | ORAL | 0 refills | Status: DC

## 2021-11-01 NOTE — Progress Notes (Signed)
° °  Subjective:    Patient ID: Edwin Jimenez, male    DOB: 06/24/1951, 71 y.o.   MRN: [id number]  HPI He states that on Monday he noted reddish-brown urine but no fever, chills, abdominal or flank pain, dysuria, discharge.  He has had difficulty with decreased stream and hesitancy and has been diagnosed with BPH in the past. He also notes a 1 week history that started with sore throat followed by rhinorrhea and dry cough that is now slightly productive with sinus pressure and upper tooth discomfort.  No fever chills or earache.  He is also noted decrease in his ability to taste.   Review of Systems     Objective:   Physical Exam Alert and in no distress.  Tender to palpation over maxillary sinuses tympanic membranes and canals are normal. Pharyngeal area is normal. Neck is supple without adenopathy or thyromegaly. Cardiac exam shows a regular sinus rhythm without murmurs or gallops. Lungs are clear to auscultation. Abdominal exam shows no masses or tenderness. Urine microscopic did show red cells but no white cells or bacteria.       Assessment & Plan:  Gross hematuria - Plan: Ambulatory referral to Urology  Abnormal urine color - Plan: POCT Urinalysis DIP (Proadvantage Device)  Benign prostatic hyperplasia with nocturia  Acute recurrent maxillary sinusitis - Plan: amoxicillin-clavulanate (AUGMENTIN) 875-125 MG tablet He will keep me informed as to when he gets the appointment with urology.  Explained that I can order a CT scan which will eventually need to be done especially if he has to wait a significant period of time before he can get in.  He was comfortable with that.

## 2021-11-13 ENCOUNTER — Other Ambulatory Visit: Payer: Self-pay | Admitting: Family Medicine

## 2021-11-13 DIAGNOSIS — E785 Hyperlipidemia, unspecified: Secondary | ICD-10-CM

## 2021-11-14 ENCOUNTER — Other Ambulatory Visit: Payer: Medicare Other

## 2021-11-16 ENCOUNTER — Ambulatory Visit: Payer: Medicare Other | Admitting: Family Medicine

## 2021-11-27 ENCOUNTER — Ambulatory Visit: Payer: Medicare Other | Admitting: Family Medicine

## 2021-11-27 ENCOUNTER — Encounter: Payer: Self-pay | Admitting: Family Medicine

## 2021-11-27 ENCOUNTER — Other Ambulatory Visit: Payer: Self-pay

## 2021-11-27 VITALS — BP 126/72 | HR 60 | Temp 97.9°F | Wt 133.4 lb

## 2021-11-27 DIAGNOSIS — R0781 Pleurodynia: Secondary | ICD-10-CM | POA: Diagnosis not present

## 2021-11-27 NOTE — Progress Notes (Addendum)
° °  Subjective:    Patient ID: Edwin Jimenez, male    DOB: 02-14-1951, 71 y.o.   MRN: [id number]  HPI He fell last Wednesday when he tripped over an object landing on his left rib area.  Since then he has continued to have left rib pain.  He has been using ibuprofen intermittently for this.  No shortness of breath or abdominal pain.   Review of Systems     Objective:   Physical Exam Alert and complaining of rib pain.  Splinting when he takes of breath.  Tender to palpation over the left lateral mid rib area involving several ribs.  Lungs are clear.  Abdominal exam shows no masses or tenderness       Assessment & Plan:  Rib pain on left side I explained that his symptoms really are consistent with probable fracture of several ribs.  Conservative care is the treatment of choice at this time since he is way past the initial most dangerous timeframe.800 mg of ibuprofen 3 times per day.  Tramadol as needed.  Splint the ribs with your arm or a pillow and you can also use a rib belt.  Listen to your body in terms of when you can return and that can take a couple weeks Cautioned against the use of tramadol while driving.

## 2021-11-27 NOTE — Patient Instructions (Signed)
800 mg of ibuprofen 3 times per day.  Tramadol as needed.  Splint the ribs with your arm or a pillow and you can also use a rib belt.  Listen to your body in terms of when you can return and that can take a couple weeks

## 2021-12-12 ENCOUNTER — Other Ambulatory Visit: Payer: Medicare Other

## 2021-12-12 ENCOUNTER — Other Ambulatory Visit: Payer: Self-pay

## 2021-12-12 DIAGNOSIS — Z0000 Encounter for general adult medical examination without abnormal findings: Secondary | ICD-10-CM

## 2021-12-12 DIAGNOSIS — E785 Hyperlipidemia, unspecified: Secondary | ICD-10-CM

## 2021-12-12 LAB — LIPID PANEL

## 2021-12-13 LAB — CBC WITH DIFFERENTIAL/PLATELET
Basophils Absolute: 0 10*3/uL (ref 0.0–0.2)
Basos: 1 %
EOS (ABSOLUTE): 0.1 10*3/uL (ref 0.0–0.4)
Eos: 2 %
Hematocrit: 44.3 % (ref 37.5–51.0)
Hemoglobin: 15.4 g/dL (ref 13.0–17.7)
Immature Grans (Abs): 0 10*3/uL (ref 0.0–0.1)
Immature Granulocytes: 0 %
Lymphocytes Absolute: 1.5 10*3/uL (ref 0.7–3.1)
Lymphs: 39 %
MCH: 33.2 pg — ABNORMAL HIGH (ref 26.6–33.0)
MCHC: 34.8 g/dL (ref 31.5–35.7)
MCV: 96 fL (ref 79–97)
Monocytes Absolute: 0.3 10*3/uL (ref 0.1–0.9)
Monocytes: 8 %
Neutrophils Absolute: 2 10*3/uL (ref 1.4–7.0)
Neutrophils: 50 %
Platelets: 178 10*3/uL (ref 150–450)
RBC: 4.64 x10E6/uL (ref 4.14–5.80)
RDW: 11.9 % (ref 11.6–15.4)
WBC: 4 10*3/uL (ref 3.4–10.8)

## 2021-12-13 LAB — COMPREHENSIVE METABOLIC PANEL
ALT: 48 IU/L — ABNORMAL HIGH (ref 0–44)
AST: 36 IU/L (ref 0–40)
Albumin/Globulin Ratio: 1.8 (ref 1.2–2.2)
Albumin: 4 g/dL (ref 3.8–4.8)
Alkaline Phosphatase: 105 IU/L (ref 44–121)
BUN/Creatinine Ratio: 25 — ABNORMAL HIGH (ref 10–24)
BUN: 21 mg/dL (ref 8–27)
Bilirubin Total: 0.5 mg/dL (ref 0.0–1.2)
CO2: 23 mmol/L (ref 20–29)
Calcium: 9.1 mg/dL (ref 8.6–10.2)
Chloride: 108 mmol/L — ABNORMAL HIGH (ref 96–106)
Creatinine, Ser: 0.83 mg/dL (ref 0.76–1.27)
Globulin, Total: 2.2 g/dL (ref 1.5–4.5)
Glucose: 95 mg/dL (ref 70–99)
Potassium: 4.8 mmol/L (ref 3.5–5.2)
Sodium: 143 mmol/L (ref 134–144)
Total Protein: 6.2 g/dL (ref 6.0–8.5)
eGFR: 94 mL/min/{1.73_m2} (ref >59)

## 2021-12-13 LAB — LIPID PANEL
Chol/HDL Ratio: 2.4 ratio (ref 0.0–5.0)
Cholesterol, Total: 129 mg/dL (ref 100–199)
HDL: 53 mg/dL (ref >39)
LDL Chol Calc (NIH): 66 mg/dL (ref 0–99)
Triglycerides: 43 mg/dL (ref 0–149)
VLDL Cholesterol Cal: 10 mg/dL (ref 5–40)

## 2021-12-13 LAB — PSA: Prostate Specific Ag, Serum: 0.7 ng/mL (ref 0.0–4.0)

## 2021-12-14 ENCOUNTER — Other Ambulatory Visit: Payer: Medicare Other

## 2021-12-19 ENCOUNTER — Ambulatory Visit (INDEPENDENT_AMBULATORY_CARE_PROVIDER_SITE_OTHER): Payer: Medicare Other | Admitting: Family Medicine

## 2021-12-19 ENCOUNTER — Other Ambulatory Visit: Payer: Self-pay

## 2021-12-19 VITALS — BP 110/70 | HR 61 | Temp 97.2°F | Ht 67.0 in | Wt 132.0 lb

## 2021-12-19 DIAGNOSIS — R351 Nocturia: Secondary | ICD-10-CM

## 2021-12-19 DIAGNOSIS — Z1211 Encounter for screening for malignant neoplasm of colon: Secondary | ICD-10-CM

## 2021-12-19 DIAGNOSIS — L308 Other specified dermatitis: Secondary | ICD-10-CM

## 2021-12-19 DIAGNOSIS — D179 Benign lipomatous neoplasm, unspecified: Secondary | ICD-10-CM

## 2021-12-19 DIAGNOSIS — R0781 Pleurodynia: Secondary | ICD-10-CM

## 2021-12-19 DIAGNOSIS — Z0000 Encounter for general adult medical examination without abnormal findings: Secondary | ICD-10-CM

## 2021-12-19 DIAGNOSIS — N528 Other male erectile dysfunction: Secondary | ICD-10-CM

## 2021-12-19 DIAGNOSIS — K219 Gastro-esophageal reflux disease without esophagitis: Secondary | ICD-10-CM

## 2021-12-19 DIAGNOSIS — E785 Hyperlipidemia, unspecified: Secondary | ICD-10-CM

## 2021-12-19 DIAGNOSIS — N401 Enlarged prostate with lower urinary tract symptoms: Secondary | ICD-10-CM

## 2021-12-19 DIAGNOSIS — Z8249 Family history of ischemic heart disease and other diseases of the circulatory system: Secondary | ICD-10-CM

## 2021-12-19 DIAGNOSIS — R0789 Other chest pain: Secondary | ICD-10-CM

## 2021-12-19 DIAGNOSIS — J301 Allergic rhinitis due to pollen: Secondary | ICD-10-CM

## 2021-12-19 LAB — POCT URINALYSIS DIP (PROADVANTAGE DEVICE)
Bilirubin, UA: NEGATIVE
Blood, UA: NEGATIVE
Glucose, UA: NEGATIVE mg/dL
Ketones, POC UA: NEGATIVE mg/dL
Leukocytes, UA: NEGATIVE
Nitrite, UA: NEGATIVE
Protein Ur, POC: NEGATIVE mg/dL
Specific Gravity, Urine: 1.025
Urobilinogen, Ur: 0.2
pH, UA: 6 (ref 5.0–8.0)

## 2021-12-19 NOTE — Progress Notes (Addendum)
Edwin Jimenez is a 71 y.o. male who presents for annual wellness visit and follow-up on chronic medical conditions.  He has recently had difficulty with hematuria and was evaluated by urology.  He does have BPH however no other problems were noted.  He continues on Proscar.  He also recently had a fall and is still suffering from some slight rib pain. ?Does have a family history of heart disease and is continuing on his Lipitor.  He does have underlying ED but presently is not taking any medications.  He does use topical medications for his eczema.  His allergies are causing no difficulty.  He does have reflux disease but presently is not taking any medications.  His home life is going quite well. ? ? ?Immunizations and Health Maintenance ?Immunization History  ?Administered Date(s) Administered  ? Fluad Quad(high Dose 65+) 07/14/2019, 08/14/2020, 06/28/2021  ? Hepatitis A 08/31/2002, 03/09/2003  ? Hepatitis B 12/11/2006, 01/13/2007, 06/16/2007  ? IPV 04/20/1999  ? Influenza, High Dose Seasonal PF 08/04/2017, 07/27/2018  ? Influenza,inj,Quad PF,6+ Mos 05/19/2014, 08/01/2015  ? Influenza-Unspecified 07/30/2016  ? PFIZER(Purple Top)SARS-COV-2 Vaccination 10/29/2019, 11/19/2019, 07/06/2020  ? Pneumococcal Conjugate-13 10/22/2016  ? Pneumococcal Polysaccharide-23 09/22/2017  ? Td 12/09/2006  ? Tdap 02/20/2011, 12/11/2021  ? Typhoid Inactivated 02/20/2011  ? Zoster, Live 03/10/2013  ? ?Health Maintenance Due  ?Topic Date Due  ? Zoster Vaccines- Shingrix (1 of 2) Never done  ? COVID-19 Vaccine (4 - Booster for [place] series) 08/31/2020  ? COLONOSCOPY (Pts 45-26yr Insurance coverage will need to be confirmed)  11/27/2021  ? ? ?Last colonoscopy: 11/28/11 Dr. HBenson Norway?Last PSA: 12/12/21 (0.7) ?Dentist: Q six months   ?Ophtho: Q year ?Exercise: walking and running QD ? ?Other doctors caring for patient include: Dr. HBenson NorwayGI ?           Dr. CHarrell Gave[facility]iology ?           Dr. [facility] ?      Dr. [facility]line [facility]  med[facility] ?      Dr. NM[facility] Cage[facility] ?  ? ? ?Advanced Directives: ?Does Patient Have a Medical Advance Directive?: Yes ?Type of Advance Directive: Living will ?Does patient want to make changes to medical advance directive?: No - Patient declined ? ?Depression screen:  See questionnaire below.   ?  ?[facility] 12/19/2021 08/17/2021 03/29/2020 01/10/2020 11/18/2018  ?Decreased Interest 0 0 0 0 0  ?Down, Depressed, Hopeless 0 0 0 0 0  ?PHQ - 2 Score 0 0 0 0 0  ? ? ?Fall Screen: See Questionaire below. ?  ?Fall Risk  12/19/2021 08/17/2021 01/10/2020 05/07/2019 09/22/2017  ?Falls in the past year? 0 0 0 0 No  ?Comment - - - Emmi Telephone Survey: data to providers prior to load -  ?Number falls in past yr: 0 0 - - -  ?Comment tripped running with dog - - - -  ?Injury with Fall? 1 0 - - -  ?Risk for fall due to : No Fall Risks No Fall Risks - - -  ?Follow up Falls evaluation completed Falls evaluation completed - - -  ? ? ?ADL screen:  See questionnaire below.  ?Functional Status Survey: ?Is the patient deaf or have difficulty hearing?: No ?Does the patient have difficulty seeing, even when wearing glasses/contacts?: No ?Does the patient have difficulty concentrating, remembering, or making decisions?: No ?Does the patient have difficulty walking or climbing stairs?: No ?Does the patient have difficulty dressing or bathing?: No ?Does the  patient have difficulty doing errands alone such as visiting a doctor's office or shopping?: No ? ? ?Review of Systems ? ?Constitutional: -, -unexpected weight change, -anorexia, -fatigue ?Allergy: -sneezing, -itching, -congestion ?Dermatology: denies changing moles, rash, lumps ?ENT: -runny nose, -ear pain, -sore throat,  ?Cardiology:  -chest pain, -palpitations, -orthopnea, ?Respiratory: -cough, -shortness of breath, -dyspnea on exertion, -wheezing,  ?Gastroenterology: -abdominal pain, -nausea, -vomiting, -diarrhea, -constipation, -dysphagia ?Hematology: -bleeding or bruising  problems ?Musculoskeletal: -arthralgias, -myalgias, -joint swelling, -back pain, - ?Ophthalmology: -vision changes,  ?Urology: -dysuria, -difficulty urinating,  -urinary frequency, -urgency, incontinence ?Neurology: -, -numbness, , -memory loss, -falls, -dizziness ? ? ? ?PHYSICAL EXAM: ? ?General Appearance: Alert, cooperative, no distress, appears stated age ?Head: Normocephalic, without obvious abnormality, atraumatic ?Eyes: PERRL, conjunctiva/corneas clear, EOM's intact ?Ears: Normal TM's and external ear canals ?Nose: Nares normal, mucosa normal, no drainage or sinus   tenderness ?Throat: Lips, mucosa, and tongue normal; teeth and gums normal ?Neck: Supple, no lymphadenopathy, thyroid:no enlargement/tenderness/nodules; no carotid bruit or JVD ?Lungs: Clear to auscultation bilaterally without wheezes, rales or ronchi; respirations unlabored ?Heart: Regular rate and rhythm, S1 and S2 normal, no murmur, rub or gallop ?Abdomen: Soft, non-tender, nondistended, normoactive bowel sounds, no masses, no hepatosplenomegaly ?Extremities: No clubbing, cyanosis or edema ?Pulses: 2+ and symmetric all extremities ?Skin: Skin color, texture, turgor normal, no rashes or lesions multiple lipomas noted. ?Lymph nodes: Cervical, supraclavicular, and axillary nodes normal ?Neurologic: CNII-XII intact, normal strength, sensation and gait; reflexes 2+ and symmetric throughout   ?Psych: Normal mood, affect, hygiene and grooming ? ?ASSESSMENT/PLAN: ?Routine general medical examination at a health care facility ? ?Rib pain on left side ? ?Benign prostatic hyperplasia with nocturia ? ?Family history of ischemic heart disease (IHD) ? ?Hyperlipidemia with target LDL less than 70 ? ?Multiple lipomas ? ?Screening for colon cancer - Plan: Cologuard ? ?Other male erectile dysfunction ? ?Other eczema ? ?Seasonal allergic rhinitis due to pollen ? ?Gastroesophageal reflux disease without esophagitis ?Recommend conservative care for the rib pain.   He seems comfortable with what is happening with the urinary and urologic issues.  Seem to be fairly stable.  He will continue on his present medication regimen.  The lab data was reviewed with him.  There was really nothing of any major concern there. ? ? ?Immunization recommendations discussed.  He is to get Shingrix at the drugstore.  Colonoscopy recommendations reviewed. ? ? ?Medicare Attestation ?I have personally reviewed: ?The patient's medical and social history ?Their use of alcohol, tobacco or illicit drugs ?Their current medications and supplements ?The patient's functional ability including ADLs,fall risks, home safety risks, cognitive, and hearing and visual impairment ?Diet and physical activities ?Evidence for depression or mood disorders ? ?The patient's weight, height, and BMI have been recorded in the chart.  I have made referrals, counseling, and provided education to the patient based on review of the above and I have provided the patient with a written personalized care plan for preventive services.   ? ? ?Jill Alexanders, MD   12/19/2021  ? ? ? ?

## 2021-12-19 NOTE — Addendum Note (Signed)
Addended by: Elyse Jarvis on: 12/19/2021 05:00 PM ? ? Modules accepted: Orders ? ?

## 2021-12-29 LAB — COLOGUARD: COLOGUARD: NEGATIVE

## 2022-06-12 ENCOUNTER — Encounter: Payer: Self-pay | Admitting: Internal Medicine

## 2022-06-14 ENCOUNTER — Ambulatory Visit: Payer: Self-pay | Admitting: Sports Medicine

## 2022-07-16 ENCOUNTER — Ambulatory Visit: Payer: Medicare Other | Admitting: Sports Medicine

## 2022-07-16 ENCOUNTER — Encounter: Payer: Self-pay | Admitting: Internal Medicine

## 2022-07-16 ENCOUNTER — Ambulatory Visit
Admission: RE | Admit: 2022-07-16 | Discharge: 2022-07-16 | Disposition: A | Discharge status: Home / Self Care | Payer: Medicare Other | Source: Ambulatory Visit | Attending: Sports Medicine | Admitting: Sports Medicine

## 2022-07-16 VITALS — BP 106/62 | Ht 67.5 in | Wt 132.0 lb

## 2022-07-16 DIAGNOSIS — M25551 Pain in right hip: Secondary | ICD-10-CM | POA: Diagnosis not present

## 2022-07-17 ENCOUNTER — Other Ambulatory Visit (INDEPENDENT_AMBULATORY_CARE_PROVIDER_SITE_OTHER): Payer: Medicare Other

## 2022-07-17 DIAGNOSIS — Z23 Encounter for immunization: Secondary | ICD-10-CM | POA: Diagnosis not present

## 2022-07-17 NOTE — Progress Notes (Signed)
   Subjective:    Patient ID: Edwin Jimenez, male    DOB: Sep 19, 1951, 71 y.o.   MRN: [id number]  HPI chief complaint: Right hip pain  Presents today with 2 months of intermittent right hip pain.  This pain is localized to the groin but he is also describing some lateral hip pain as well.  It is most noticeable with running.  However, he states that he has times where it is not too bothersome and he is able to run through it.  He does endorse some stiffness in the hip as well.  Pain will radiate into his thigh but not past the knee.  He typically runs 35 to 40 miles a week.  He has not tried anything specifically to try to treat his pain.  He denies any similar issues in the past.    Review of Systems As above    Objective:   Physical Exam  Well-developed, well-nourished.  No acute distress  Right hip: Smooth painless hip range of motion with a negative logroll but he does have reproducible groin pain with full internal rotation.  He is also somewhat tender to palpation over the posterior aspect of the greater trochanter.  He has 4+/5 strength with resisted hip abduction.  Slightly positive Trendelenburg.  Neurovascularly intact distally.  X-rays of the right hip show mild to moderate OA.  Nothing acute      Assessment & Plan:   Right hip pain likely secondary to DJD  I believe that the majority of Pat's symptoms are originating from some mild to moderate DJD in his right hip.  However, he does have some hip abductor weakness and pelvic stabilizer weakness so we will address those with home exercises.  He will follow-up with me again in 3 to 4 weeks for reevaluation.  If symptoms persist then we could consider merits of a diagnostic/therapeutic intra-articular hip injection.  I think he can continue with activity using pain as his guide but he may need to lower his weekly mileage just a bit  This note was dictated using Dragon naturally speaking software and may contain errors in  syntax, spelling, or content which have not been identified prior to signing this note.

## 2022-07-18 ENCOUNTER — Encounter: Payer: Self-pay | Admitting: Sports Medicine

## 2022-07-29 ENCOUNTER — Encounter: Payer: Self-pay | Admitting: Internal Medicine

## 2022-08-13 ENCOUNTER — Ambulatory Visit: Payer: Medicare Other | Admitting: Sports Medicine

## 2022-08-20 ENCOUNTER — Ambulatory Visit: Payer: Medicare Other | Admitting: Sports Medicine

## 2022-08-20 ENCOUNTER — Encounter: Payer: Self-pay | Admitting: Sports Medicine

## 2022-08-20 VITALS — BP 110/68 | Ht 67.5 in | Wt 132.0 lb

## 2022-08-20 DIAGNOSIS — M1611 Unilateral primary osteoarthritis, right hip: Secondary | ICD-10-CM

## 2022-08-21 ENCOUNTER — Encounter: Payer: Self-pay | Admitting: Sports Medicine

## 2022-08-21 ENCOUNTER — Other Ambulatory Visit: Payer: Self-pay

## 2022-08-21 DIAGNOSIS — M25561 Pain in right knee: Secondary | ICD-10-CM

## 2022-08-21 MED ORDER — DICLOFENAC SODIUM 75 MG PO TBEC: 75.0000 mg | DELAYED_RELEASE_TABLET | Freq: Two times a day (BID) | ORAL | 0 refills | Status: DC | PRN

## 2022-08-21 NOTE — Progress Notes (Signed)
   Subjective:    Patient ID: Edwin Jimenez, male    DOB: 06/30/1951, 71 y.o.   MRN: [id number]  HPI  Edwin Jimenez presents today for follow-up on right hip pain felt to be secondary to some mild DJD seen on x-ray.  He is doing better.  He has been able to continue running with minimal discomfort.  He did take some diclofenac as well as meloxicam.  He feels like those were both beneficial.  He has also been working on his hip strengthening exercises.    Review of Systems As above    Objective:   Physical Exam  Well-developed, well-nourished.  No acute distress  Right hip: There is some slight decreased internal rotation of the right hip compared to the left secondary to his mild DJD.  However, he has no pain with passive internal or external rotation.  Hip abductor strength is much improved when compared to his previous office visit.  Negative Trendelenburg today.      Assessment & Plan:   Improved right hip pain likely secondary to mild DJD  Edwin Jimenez has done an excellent job strengthening his right hip.  He understands the importance of continuing with this and hopefully his arthritis pain will continue to be easily manageable with occasional NSAIDs.  I will give him a refill on his diclofenac to take 75 mg twice daily as needed.  He may continue with activity using pain as his guide and follow-up with me as needed.  This note was dictated using Dragon naturally speaking software and may contain errors in syntax, spelling, or content which have not been identified prior to signing this note.

## 2022-10-09 ENCOUNTER — Encounter: Payer: Self-pay | Admitting: Sports Medicine

## 2022-10-19 ENCOUNTER — Telehealth: Payer: Self-pay | Admitting: Family Medicine

## 2022-10-19 DIAGNOSIS — U071 COVID-19: Secondary | ICD-10-CM

## 2022-10-19 MED ORDER — NIRMATRELVIR/RITONAVIR (PAXLOVID)TABLET: 3.0000 | ORAL_TABLET | Freq: Two times a day (BID) | ORAL | 0 refills | Status: AC

## 2022-10-19 NOTE — Telephone Encounter (Signed)
He called stating that on Tuesday he developed nasal congestion, cough has become productive as well as sore throat, feeling hot with headache, sinus pressure, PND and fatigue.  He did test himself for COVID and was positive.  He would like Paxlovid called in.

## 2022-10-22 ENCOUNTER — Other Ambulatory Visit: Payer: Self-pay | Admitting: Family Medicine

## 2022-10-22 DIAGNOSIS — E785 Hyperlipidemia, unspecified: Secondary | ICD-10-CM

## 2022-11-09 ENCOUNTER — Emergency Department (HOSPITAL_BASED_OUTPATIENT_CLINIC_OR_DEPARTMENT_OTHER)
Admission: EM | Admit: 2022-11-09 | Discharge: 2022-11-09 | Disposition: A | Discharge status: Home / Self Care | Payer: Medicare Other | Attending: Emergency Medicine | Admitting: Emergency Medicine

## 2022-11-09 ENCOUNTER — Encounter (HOSPITAL_BASED_OUTPATIENT_CLINIC_OR_DEPARTMENT_OTHER): Payer: Self-pay

## 2022-11-09 ENCOUNTER — Other Ambulatory Visit: Payer: Self-pay

## 2022-11-09 ENCOUNTER — Emergency Department (HOSPITAL_BASED_OUTPATIENT_CLINIC_OR_DEPARTMENT_OTHER): Payer: Medicare Other

## 2022-11-09 DIAGNOSIS — R1013 Epigastric pain: Secondary | ICD-10-CM | POA: Diagnosis present

## 2022-11-09 DIAGNOSIS — R1084 Generalized abdominal pain: Secondary | ICD-10-CM | POA: Diagnosis not present

## 2022-11-09 LAB — CBC
HCT: 46.5 % (ref 39.0–52.0)
Hemoglobin: 15.8 g/dL (ref 13.0–17.0)
MCH: 32.2 pg (ref 26.0–34.0)
MCHC: 34 g/dL (ref 30.0–36.0)
MCV: 94.7 fL (ref 80.0–100.0)
Platelets: 211 10*3/uL (ref 150–400)
RBC: 4.91 MIL/uL (ref 4.22–5.81)
RDW: 12.5 % (ref 11.5–15.5)
WBC: 6.8 10*3/uL (ref 4.0–10.5)
nRBC: 0 % (ref 0.0–0.2)

## 2022-11-09 LAB — COMPREHENSIVE METABOLIC PANEL
ALT: 28 U/L (ref 0–44)
AST: 31 U/L (ref 15–41)
Albumin: 4.3 g/dL (ref 3.5–5.0)
Alkaline Phosphatase: 47 U/L (ref 38–126)
Anion gap: 11 (ref 5–15)
BUN: 30 mg/dL — ABNORMAL HIGH (ref 8–23)
CO2: 24 mmol/L (ref 22–32)
Calcium: 10 mg/dL (ref 8.9–10.3)
Chloride: 106 mmol/L (ref 98–111)
Creatinine, Ser: 0.93 mg/dL (ref 0.61–1.24)
GFR, Estimated: >60 mL/min (ref >60)
Glucose, Bld: 94 mg/dL (ref 70–99)
Potassium: 4.3 mmol/L (ref 3.5–5.1)
Sodium: 141 mmol/L (ref 135–145)
Total Bilirubin: 0.4 mg/dL (ref 0.3–1.2)
Total Protein: 7.1 g/dL (ref 6.5–8.1)

## 2022-11-09 LAB — URINALYSIS, ROUTINE W REFLEX MICROSCOPIC
Bacteria, UA: NONE SEEN
Bilirubin Urine: NEGATIVE
Glucose, UA: NEGATIVE mg/dL
Ketones, ur: NEGATIVE mg/dL
Leukocytes,Ua: NEGATIVE
Nitrite: NEGATIVE
Protein, ur: NEGATIVE mg/dL
Specific Gravity, Urine: 1.022 (ref 1.005–1.030)
pH: 5.5 (ref 5.0–8.0)

## 2022-11-09 LAB — LIPASE, BLOOD: Lipase: 25 U/L (ref 11–51)

## 2022-11-09 MED ORDER — DICYCLOMINE HCL 20 MG PO TABS: 20.0000 mg | ORAL_TABLET | Freq: Two times a day (BID) | ORAL | 0 refills | Status: DC

## 2022-11-09 MED ORDER — IOHEXOL 300 MG/ML  SOLN
100.0000 mL | Freq: Once | INTRAMUSCULAR | Status: AC | PRN
  Administered 2022-11-09: 80 mL via INTRAVENOUS

## 2022-11-09 NOTE — ED Provider Notes (Signed)
[facility] Provider Note   CSN: [id number] Arrival date & time: 11/09/22  1504     History Chief Complaint  Patient presents with   Abdominal Pain    HPI Edwin Jimenez is a 72 y.o. male presenting for chief complaint of abdominal pain.  72 year old male with a minimal medical history coming in with 4 days of abdominal pain epigastric in nature however it started to radiate to his right lower quadrant called his PCP recommended come in to get a CT scan.  He is ambulatory tolerating p.o. intake and is currently asymptomatic.  He had 3 paroxysms of this pain over the last 72 hours. Episode on Thursday was associate with nausea and vomiting. Endorses a history of similar 2 years ago with a CT scan was ultimately nondiagnostic and symptoms that improved with muscle relaxers. Has not been seen by GI at this time.  Does state he follows with urology for narrowed ureters and intermittent hematuria.  Patient's recorded medical, surgical, social, medication list and allergies were reviewed in the Snapshot window as part of the initial history.   Review of Systems   Review of Systems  Constitutional:  Negative for chills and fever.  HENT:  Negative for ear pain and sore throat.   Eyes:  Negative for pain and visual disturbance.  Respiratory:  Negative for cough and shortness of breath.   Cardiovascular:  Negative for chest pain and palpitations.  Gastrointestinal:  Positive for abdominal pain, nausea and vomiting.  Genitourinary:  Negative for dysuria and hematuria.  Musculoskeletal:  Negative for arthralgias and back pain.  Skin:  Negative for color change and rash.  Neurological:  Negative for seizures and syncope.  All other systems reviewed and are negative.   Physical Exam Updated Vital Signs BP 137/75 (BP Location: Right Arm)   Pulse 62   Temp 97.9 F (36.6 C)   Resp 18   Ht 5' 7.5" (1.715 m)   Wt 59.9 kg   SpO2 100%   BMI  20.38 kg/m  Physical Exam Vitals and nursing note reviewed.  Constitutional:      General: He is not in acute distress.    Appearance: He is well-developed.  HENT:     Head: Normocephalic and atraumatic.  Eyes:     Conjunctiva/sclera: Conjunctivae normal.  Cardiovascular:     Rate and Rhythm: Normal rate and regular rhythm.     Heart sounds: No murmur heard. Pulmonary:     Effort: Pulmonary effort is normal. No respiratory distress.     Breath sounds: Normal breath sounds.  Abdominal:     Palpations: Abdomen is soft.     Tenderness: There is no abdominal tenderness.  Musculoskeletal:        General: No swelling.     Cervical back: Neck supple.  Skin:    General: Skin is warm and dry.     Capillary Refill: Capillary refill takes less than 2 seconds.  Neurological:     Mental Status: He is alert.  Psychiatric:        Mood and Affect: Mood normal.      ED Course/ Medical Decision Making/ A&P   Procedures Procedures   Medications Ordered in ED Medications  iohexol (OMNIPAQUE) 300 MG/ML solution 100 mL (80 mLs Intravenous Contrast Given 11/09/22 1635)   Medical Decision Making:   Xavior Niazi is a 72 y.o. male who presented to the ED today with abdominal pain, detailed above.  Additional history discussed with patient's family/caregivers.  Patient placed on continuous vitals and telemetry monitoring while in ED which was reviewed periodically.  Complete initial physical exam performed, notably the patient  was HDS in NAD no active tenderness.     Reviewed and confirmed nursing documentation for past medical history, family history, social history.    Initial Assessment:   With the patient's presentation of abdominal pain, most likely diagnosis is nonspecific etiology. Other diagnoses were considered including (but not limited to) gastroenteritis, colitis, small bowel obstruction, appendicitis, cholecystitis, pancreatitis, nephrolithiasis, UTI, pyleonephritis, .  These are considered less likely due to history of present illness and physical exam findings.   This is most consistent with an acute life/limb threatening illness complicated by underlying chronic conditions.   Initial Plan:  CBC/CMP to evaluate for underlying infectious/metabolic etiology for patient's abdominal pain  Lipase to evaluate for pancreatitis  CTAB/Pelvis with contrast to evaluate for structural/surgical etiology of patients' severe abdominal pain.  Urinalysis and repeat physical assessment to evaluate for UTI/Pyelonpehritis  Empiric management of symptoms with escalating pain control and antiemetics as needed.   Initial Study Results:   Laboratory  All laboratory results reviewed without evidence of clinically relevant pathology.   Radiology All images reviewed independently. Agree with radiology report at this time.   CT ABDOMEN PELVIS W CONTRAST  Result Date: 11/09/2022 CLINICAL DATA:  Abdominal pain. EXAM: CT ABDOMEN AND PELVIS WITH CONTRAST TECHNIQUE: Multidetector CT imaging of the abdomen and pelvis was performed using the standard protocol following bolus administration of intravenous contrast. RADIATION DOSE REDUCTION: This exam was performed according to the departmental dose-optimization program which includes automated exposure control, adjustment of the mA and/or kV according to patient size and/or use of iterative reconstruction technique. CONTRAST:  66m OMNIPAQUE IOHEXOL 300 MG/ML  SOLN COMPARISON:  February 3rd, 2023 FINDINGS: Lower chest: No acute abnormality. Hepatobiliary: No focal liver abnormality is seen. No gallstones, gallbladder wall thickening, or biliary dilatation. Pancreas: Unremarkable. No pancreatic ductal dilatation or surrounding inflammatory changes. Spleen: Normal in size without focal abnormality. Adrenals/Urinary Tract: 2 right renal cysts. Otherwise normal appearance of the kidneys, ureters and urinary bladder. Stomach/Bowel: Stomach is within  normal limits. Appendix appears normal. No evidence of bowel wall thickening, distention, or inflammatory changes. Vascular/Lymphatic: No significant vascular findings are present. No enlarged abdominal or pelvic lymph nodes. Reproductive: Irregular enlargement of the prostate gland, which measures up to 4 cm and extends into the base of the urinary bladder. Other: No abdominal wall hernia or abnormality. No abdominopelvic ascites. Musculoskeletal: No acute or significant osseous findings. IMPRESSION: 1. No acute findings within the abdomen or pelvis. 2. Irregular enlargement of the prostate gland, which measures up to 4 cm and extends into the base of the urinary bladder. Please correlate to serum PSA values. Electronically Signed   By: DFidela SalisburyM.D.   On: 11/09/2022 17:04    Final Reassessment and Plan:   Patient's history of present illness and physical exam findings are most consistent with no acute pathology at this time the context of these objective findings.  Symptoms remain resolved at this time. Informed patient of his elevated prostate size and importance of repeat PSA checking with primary urologist and patient expressed understanding.  Patient no acute distress stable for outpatient care and management this time.  Will trial Bentyl for symptomatic control as he had improvement with similar medications in the past.  Disposition:  I have considered need for hospitalization, however, considering all of the above, I  believe this patient is stable for discharge at this time.  Patient/family educated about specific return precautions for given chief complaint and symptoms.  Patient/family educated about follow-up with PCP.     Patient/family expressed understanding of return precautions and need for follow-up. Patient spoken to regarding all imaging and laboratory results and appropriate follow up for these results. All education provided in verbal form with additional information in  written form. Time was allowed for answering of patient questions. Patient discharged.    Emergency Department Medication Summary:   Medications  iohexol (OMNIPAQUE) 300 MG/ML solution 100 mL (80 mLs Intravenous Contrast Given 11/09/22 1635)           Clinical Impression:  1. Generalized abdominal pain      Discharge   Final Clinical Impression(s) / ED Diagnoses Final diagnoses:  Generalized abdominal pain    Rx / DC Orders ED Discharge Orders          Ordered    dicyclomine (BENTYL) 20 MG tablet  2 times daily        11/09/22 1722              Tretha Sciara, MD 11/09/22 2251

## 2022-11-09 NOTE — ED Notes (Signed)
Discharge instructions discussed with pt. Pt verbalized understanding. Pt stable and ambulatory.  °

## 2022-11-09 NOTE — ED Triage Notes (Signed)
Patient here POV from Home.  Endorses for 1.5 Weeks an Intermittent ABD Pain that is Right Sided and radiates downwards. Has had 3 Episodes of this Discomfort with last being this AM.   Some nausea with pain. No Diarrhea.   NAD Noted during Triage. A&Ox4. Gcs 15. Ambulatory.

## 2022-11-11 ENCOUNTER — Ambulatory Visit: Payer: Medicare Other | Admitting: Family Medicine

## 2022-11-11 ENCOUNTER — Encounter: Payer: Self-pay | Admitting: Family Medicine

## 2022-11-11 VITALS — BP 122/62 | HR 60 | Temp 97.4°F | Resp 14 | Wt 132.6 lb

## 2022-11-11 DIAGNOSIS — R319 Hematuria, unspecified: Secondary | ICD-10-CM | POA: Diagnosis not present

## 2022-11-11 DIAGNOSIS — R109 Unspecified abdominal pain: Secondary | ICD-10-CM

## 2022-11-11 LAB — POCT URINALYSIS DIP (PROADVANTAGE DEVICE)
Bilirubin, UA: NEGATIVE
Glucose, UA: NEGATIVE mg/dL
Leukocytes, UA: NEGATIVE
Nitrite, UA: NEGATIVE
Specific Gravity, Urine: 1.02
Urobilinogen, Ur: 0.2
pH, UA: 6 (ref 5.0–8.0)

## 2022-11-11 NOTE — Progress Notes (Signed)
   Subjective:    Patient ID: Edwin Jimenez, male    DOB: 1951/01/25, 72 y.o.   MRN: [id number]  HPI He is here for a follow-up visit.  He was seen recently in the emergency room for evaluation of right flank pain.  He has been having intermittent difficulty with flank pain over the last week.  They can last anywhere from 30 minutes to 2 hours.  It is constant in nature.  He did have 1 episode of vomiting.  He was seen in the emergency room.  Blood work and CT scan was done.  I did review all of this.  Essentially the workup was negative except for an enlarged prostate which is well-documented.  They did give him some hyoscyamine thinking this could be spasm related.  The only other symptom he does have a 2-week history of night sweats. He has also been having difficulty with hematuria over the last several months.  He has been seen by urology and cystoscopy was apparently negative.  Apparently the urologist said something about doing another cystoscopy when he is having actively bleeding.  Review of Systems     Objective:   Physical Exam Alert and in no distress. Tympanic membranes and canals are normal. Pharyngeal area is normal. Neck is supple without adenopathy or thyromegaly. Cardiac exam shows a regular sinus rhythm without murmurs or gallops. Lungs are clear to auscultation. Abdominal exam negative        Assessment & Plan:  Right flank pain - Plan: US Abdomen Limited RUQ (LIVER/GB)  Hematuria, unspecified type - Plan: POCT Urinalysis DIP (Proadvantage Device)

## 2022-11-14 ENCOUNTER — Encounter: Payer: Self-pay | Admitting: Family Medicine

## 2022-11-14 ENCOUNTER — Ambulatory Visit
Admission: RE | Admit: 2022-11-14 | Discharge: 2022-11-14 | Disposition: A | Discharge status: Home / Self Care | Payer: Medicare Other | Source: Ambulatory Visit | Attending: Family Medicine | Admitting: Family Medicine

## 2022-12-24 ENCOUNTER — Encounter: Payer: Self-pay | Admitting: Family Medicine

## 2022-12-24 ENCOUNTER — Ambulatory Visit (INDEPENDENT_AMBULATORY_CARE_PROVIDER_SITE_OTHER): Payer: Medicare Other | Admitting: Family Medicine

## 2022-12-24 VITALS — BP 130/62 | HR 48 | Ht 67.0 in | Wt 131.8 lb

## 2022-12-24 DIAGNOSIS — D179 Benign lipomatous neoplasm, unspecified: Secondary | ICD-10-CM | POA: Diagnosis not present

## 2022-12-24 DIAGNOSIS — J301 Allergic rhinitis due to pollen: Secondary | ICD-10-CM

## 2022-12-24 DIAGNOSIS — R351 Nocturia: Secondary | ICD-10-CM

## 2022-12-24 DIAGNOSIS — E785 Hyperlipidemia, unspecified: Secondary | ICD-10-CM

## 2022-12-24 DIAGNOSIS — M79645 Pain in left finger(s): Secondary | ICD-10-CM

## 2022-12-24 DIAGNOSIS — N401 Enlarged prostate with lower urinary tract symptoms: Secondary | ICD-10-CM

## 2022-12-24 DIAGNOSIS — N528 Other male erectile dysfunction: Secondary | ICD-10-CM

## 2022-12-24 DIAGNOSIS — K219 Gastro-esophageal reflux disease without esophagitis: Secondary | ICD-10-CM

## 2022-12-24 DIAGNOSIS — Z0000 Encounter for general adult medical examination without abnormal findings: Secondary | ICD-10-CM

## 2022-12-24 DIAGNOSIS — L719 Rosacea, unspecified: Secondary | ICD-10-CM

## 2022-12-24 DIAGNOSIS — Z8249 Family history of ischemic heart disease and other diseases of the circulatory system: Secondary | ICD-10-CM

## 2022-12-24 DIAGNOSIS — H25013 Cortical age-related cataract, bilateral: Secondary | ICD-10-CM

## 2022-12-24 NOTE — Progress Notes (Signed)
Edwin Jimenez is a 72 y.o. male who presents for annual wellness visit ,CPE and follow-up on chronic medical conditions.  He does complain of left thumb pain and is now interfering with his ADLs.  He continues to be followed by urology for the hematuria.  So far the evaluation has not shown any pathology.  He would like to have another PSA just to be on the safe side.  He did have 1 a year ago.  He does have evidence of BPH and has had some BPH related symptoms but none at the present time.  He is taking finasteride.  He also had difficulty with abdominal pain and did get an extensive workup on that blood again nothing was found.  He is no longer having abdominal pain.  He continues on Lipitor and is having no difficulty with that. He does have seasonal allergies and is to start medication soon.  Recommended nasal steroid spray.  His rosacea is not causing any difficulty.  Immunizations and Health Maintenance Immunization History  Administered Date(s) Administered   COVID-19, mRNA, vaccine(Comirnaty)12 years and older 06/28/2022   Fluad Quad(high Dose 65+) 07/14/2019, 08/14/2020, 06/28/2021, 07/17/2022   Hepatitis A 08/31/2002, 03/09/2003   Hepatitis B 12/11/2006, 01/13/2007, 06/16/2007   IPV 04/20/1999   Influenza, High Dose Seasonal PF 08/04/2017, 07/27/2018   Influenza,inj,Quad PF,6+ Mos 05/19/2014, 08/01/2015   Influenza-Unspecified 07/30/2016   PFIZER(Purple Top)SARS-COV-2 Vaccination 10/29/2019, 11/19/2019, 07/06/2020   Pneumococcal Conjugate-13 10/22/2016   Pneumococcal Polysaccharide-23 09/22/2017   Td 12/09/2006   Tdap 02/20/2011, 12/11/2021   Typhoid Inactivated 02/20/2011   Zoster, Live 03/10/2013   Health Maintenance Due  Topic Date Due   Zoster Vaccines- Shingrix (1 of 2) Never done   COVID-19 Vaccine (5 - 2023-24 season) 08/23/2022    Last colonoscopy: Cologuard 12/24/2021 Last PSA: 12/12/2021 Dentist: within the last year Ophtho: within the last year Exercise:  running 6 days a week  Other doctors caring for patient include:  Dr. [facility]  Dr. Harrell Gave cardiology  Dr. Nevada Crane dermatology  Dr. Micheline Chapman sport medicine Dr. Milford Cage urology   Advanced Directives: -on file    Depression screen:  See questionnaire below.        12/24/2022    8:15 AM 12/19/2021    3:02 PM 08/17/2021    8:29 AM 03/29/2020    1:47 PM 01/10/2020    9:45 AM  Depression screen PHQ 2/9  Decreased Interest 0 0 0 0 0  Down, Depressed, Hopeless 0 0 0 0 0  PHQ - 2 Score 0 0 0 0 0    Fall Screen: See Questionaire below.      12/24/2022    8:15 AM 12/19/2021    3:01 PM 08/17/2021    8:29 AM 01/10/2020    9:45 AM 05/07/2019    6:56 PM  [facility] in the past year? 0 0 0 0 0  Comment     Emmi Telephone Survey: data to providers prior to load  Number falls in past yr: 0 0 0    Comment  tripped running with dog     Injury with Fall? 0 1 0    Risk for fall due to : No Fall Risks No Fall Risks No Fall Risks    Follow up Falls evaluation completed Falls evaluation completed Falls evaluation completed      ADL screen:  See questionnaire below.  Functional Status Survey: Is the patient deaf or have difficulty hearing?: No Does the patient  have difficulty seeing, even when wearing glasses/contacts?: No Does the patient have difficulty concentrating, remembering, or making decisions?: No Does the patient have difficulty walking or climbing stairs?: No Does the patient have difficulty dressing or bathing?: No Does the patient have difficulty doing errands alone such as visiting a doctor's office or shopping?: No   Review of Systems  Constitutional: -, -unexpected weight change, -anorexia, -fatigue Allergy: -sneezing, -itching, -congestion Dermatology: denies changing moles, rash, lumps ENT: -runny nose, -ear pain, -sore throat,  Cardiology:  -chest pain, -palpitations, -orthopnea, Respiratory: -cough, -shortness of breath, -dyspnea on exertion, -wheezing,   Gastroenterology: -abdominal pain, -nausea, -vomiting, -diarrhea, -constipation, -dysphagia Hematology: -bleeding or bruising problems Musculoskeletal: -arthralgias, -myalgias, -joint swelling, -back pain, - Ophthalmology: -vision changes,  Urology: -dysuria, -difficulty urinating,  -urinary frequency, -urgency, incontinence Neurology: -, -numbness, , -memory loss, -falls, -dizziness    PHYSICAL EXAM:   General Appearance: Alert, cooperative, no distress, appears stated age Head: Normocephalic, without obvious abnormality, atraumatic Eyes: PERRL, conjunctiva/corneas clear, EOM's intact,  Ears: Normal TM's and external ear canals Nose: Nares normal, mucosa normal, no drainage or sinus   tenderness Throat: Lips, mucosa, and tongue normal; teeth and gums normal Neck: Supple, no lymphadenopathy, thyroid:no enlargement/tenderness/nodules; no carotid bruit or JVD Lungs: Clear to auscultation bilaterally without wheezes, rales or ronchi; respirations unlabored Heart: Regular rate and rhythm, S1 and S2 normal, no murmur, rub or gallop Abdomen: Soft, non-tender, nondistended, normoactive bowel sounds, no masses, no hepatosplenomegaly Skin: Skin color, texture, turgor normal, no rashes multiple lipomas noted Lymph nodes: Cervical, supraclavicular, and axillary nodes normal Neurologic: CNII-XII intact, normal strength, sensation and gait; reflexes 2+ and symmetric throughout   Psych: Normal mood, affect, hygiene and grooming  ASSESSMENT/PLAN: Routine general medical examination at a health care facility  Seasonal allergic rhinitis due to pollen  Gastroesophageal reflux disease without esophagitis  Rosacea  Multiple lipomas  Hyperlipidemia with target LDL less than 70 - Plan: Lipid panel  Family history of ischemic heart disease (IHD)  Other male erectile dysfunction  Benign prostatic hyperplasia with nocturia - Plan: PSA  Thumb pain, left - Plan: DG Finger Thumb Left  Discussed  the possible treatment of the thumb pain with an injection.  Recommend nasal steroid spray for his allergies.  He will continue on his other medications and follow-up with urology as previously scheduled.  Also recommend get COVID, RSV and shingles vaccine.  I will renew her Lipitor after review of the LDL to see if we need to increase the dosing.  Discussed PSA screening (risks/benefits), recommended at least 30 minutes of aerobic activity at least 5 days/week; proper sunscreen use reviewed; healthy diet and alcohol recommendations (less than or equal to 2 drinks/day) reviewed; regular seatbelt use; changing batteries in smoke detectors. Immunization recommendations discussed.  Colonoscopy recommendations reviewed.   Medicare Attestation I have personally reviewed: The patient's medical and social history Their use of alcohol, tobacco or illicit drugs Their current medications and supplements The patient's functional ability including ADLs,fall risks, home safety risks, cognitive, and hearing and visual impairment Diet and physical activities Evidence for depression or mood disorders  The patient's weight, height, and BMI have been recorded in the chart.  I have made referrals, counseling, and provided education to the patient based on review of the above and I have provided the patient with a written personalized care plan for preventive services.     Jill Alexanders, MD   12/24/2022

## 2022-12-25 ENCOUNTER — Other Ambulatory Visit: Payer: Self-pay | Admitting: Family Medicine

## 2022-12-25 ENCOUNTER — Encounter: Payer: Self-pay | Admitting: Family Medicine

## 2022-12-25 LAB — LIPID PANEL
Chol/HDL Ratio: 2.4 ratio (ref 0.0–5.0)
Cholesterol, Total: 146 mg/dL (ref 100–199)
HDL: 60 mg/dL (ref >39)
LDL Chol Calc (NIH): 74 mg/dL (ref 0–99)
Triglycerides: 58 mg/dL (ref 0–149)
VLDL Cholesterol Cal: 12 mg/dL (ref 5–40)

## 2022-12-25 LAB — PSA: Prostate Specific Ag, Serum: 0.4 ng/mL (ref 0.0–4.0)

## 2022-12-25 MED ORDER — ATORVASTATIN CALCIUM 40 MG PO TABS: 40.0000 mg | ORAL_TABLET | Freq: Every day | ORAL | 3 refills | Status: DC

## 2022-12-26 MED ORDER — ATORVASTATIN CALCIUM 40 MG PO TABS: 40.0000 mg | ORAL_TABLET | Freq: Every day | ORAL | 3 refills | Status: DC

## 2022-12-31 ENCOUNTER — Ambulatory Visit
Admission: RE | Admit: 2022-12-31 | Discharge: 2022-12-31 | Disposition: A | Discharge status: Home / Self Care | Payer: Medicare Other | Source: Ambulatory Visit | Attending: Family Medicine | Admitting: Family Medicine

## 2023-01-02 ENCOUNTER — Encounter: Payer: Self-pay | Admitting: Family Medicine

## 2023-01-14 ENCOUNTER — Encounter: Payer: Self-pay | Admitting: Family Medicine

## 2023-01-14 DIAGNOSIS — M79646 Pain in unspecified finger(s): Secondary | ICD-10-CM

## 2023-01-16 ENCOUNTER — Ambulatory Visit (INDEPENDENT_AMBULATORY_CARE_PROVIDER_SITE_OTHER): Payer: Medicare Other | Admitting: Sports Medicine

## 2023-01-16 ENCOUNTER — Encounter: Payer: Self-pay | Admitting: Sports Medicine

## 2023-01-16 DIAGNOSIS — M1812 Unilateral primary osteoarthritis of first carpometacarpal joint, left hand: Secondary | ICD-10-CM | POA: Diagnosis not present

## 2023-01-16 DIAGNOSIS — G8929 Other chronic pain: Secondary | ICD-10-CM

## 2023-01-16 DIAGNOSIS — M79645 Pain in left finger(s): Secondary | ICD-10-CM | POA: Diagnosis not present

## 2023-01-16 MED ORDER — LIDOCAINE HCL 1 % IJ SOLN
0.5000 mL | INTRAMUSCULAR | Status: AC | PRN
  Administered 2023-01-16: .5 mL

## 2023-01-16 MED ORDER — BETAMETHASONE SOD PHOS & ACET 6 (3-3) MG/ML IJ SUSP
6.0000 mg | INTRAMUSCULAR | Status: AC | PRN
  Administered 2023-01-16: 6 mg via INTRA_ARTICULAR

## 2023-01-16 NOTE — Progress Notes (Signed)
Left thumb pain; has gotten worse over the last few months Pain with gripping; weakness, drops things when he goes to grab them Has tried voltaren gel, and diclofenac tabs with no relief  Xrays done at PCP

## 2023-01-16 NOTE — Progress Notes (Addendum)
Edwin Jimenez - 72 y.o. male MRN [id number]  Date of birth: Jan 07, 1951  Office Visit Note: Visit Date: 01/16/2023 PCP: Ronnald Nian, MD Referred by: Ronnald Nian, MD  Subjective: Chief Complaint  Patient presents with   Left Hand - Pain   HPI: Edwin Jimenez is a pleasant 72 y.o. male who presents today for chronic left thumb pain.  "Edwin Jimenez" is a friend and patient of Dr. Sharlot Gowda who sent him here for evaluation.  He has had left thumb pain for few years but has gotten worse over the last few months.  Gets pain with gripping, has even been dropping a few objects because of the pain.  He has tried oral diclofenac and topical Voltaren gel without much relief.  He is right-hand dominant.  Pertinent ROS were reviewed with the patient and found to be negative unless otherwise specified above in HPI.   Assessment & Plan: Visit Diagnoses:  1. Arthritis of carpometacarpal (CMC) joint of left thumb   2. Chronic pain of left thumb    Plan: Discussed with Edwin Jimenez that his pain is from his [facility] joint osteoarthritis about the thumb.  He has failed topical and oral anti-inflammatory medication.  He has rather significant pain with daily activity.  Discussed all treatment options, through shared decision making elected to proceed with [facility] joint corticosteroid injection.  We did fit him for a CMC comfort brace today, he found this uncomfortable.  He may use ice and I also recommend him returning to his oral diclofenac 75mg  twice daily for the next 3-4 days for any postinjection pain.  He will follow-up with me as needed.  Follow-up: Return if symptoms worsen or fail to improve.   Meds & Orders: No orders of the defined types were placed in this encounter.   Orders Placed This Encounter  Procedures   Small Joint Inj: L thumb CMC     Procedures: Small Joint Inj: L thumb CMC on 01/16/2023 11:41 AM Indications: pain Details: 27 G needle, radial approach Medications: 0.5 mL  lidocaine 1 %; 6 mg betamethasone acetate-betamethasone sodium phosphate 6 (3-3) MG/ML Outcome: tolerated well, no immediate complications Procedure, treatment alternatives, risks and benefits explained, specific risks discussed. Consent was given by the patient. Immediately prior to procedure a time out was called to verify the correct patient, procedure, equipment, support staff and site/side marked as required. Patient was prepped and draped in the usual sterile fashion.          Clinical History: No specialty comments available.  He reports that he has never smoked. He has never used smokeless tobacco. No results for input(s): "HGBA1C", "LABURIC" in the last 8760 hours.  Objective:   Vital Signs: There were no vitals taken for this visit.  Physical Exam  Gen: Well-appearing, in no acute distress; non-toxic CV: Well-perfused. Warm.  Resp: Breathing unlabored on room air; no wheezing. Psych: Fluid speech in conversation; appropriate affect; normal thought process Neuro: Sensation intact throughout. No gross coordination deficits.   Ortho Exam - Left hand/thumb: Positive TTP noted over the [facility] joint.  Positive CMC grind test.  There is no insufficiency of the UCL or RCL.  Negative Finkelstein's test.  No redness or swelling.  Okay sign intact.  Cap refill less than 2 seconds.  Imaging:  *Three-view x-ray from the left thumb on 12/31/2022 was reviewed and interpreted by myself.  AP, oblique and lateral films were reviewed.  There is CMC joint sclerosis with a larger spur  on the ulnar side of the [facility] joint.  No acute fracture.  DG Finger Thumb Left CLINICAL DATA:  Chronic left thumb pain without known injury.  EXAM: LEFT THUMB 2+V  COMPARISON:  None Available.  FINDINGS: There is no evidence of fracture or dislocation. Moderate narrowing of the first carpometacarpal joint is noted. Soft tissues are unremarkable.  IMPRESSION: Moderate osteoarthritis of first carpometacarpal  joint. No acute abnormality seen.  Electronically Signed   By: Lupita Raider M.D.   On: 01/02/2023 08:16    Past Medical/Family/Surgical/Social History: Medications & Allergies reviewed per EMR, new medications updated. Patient Active Problem List   Diagnosis Date Noted   Benign prostatic hyperplasia with nocturia 11/01/2021   Seasonal allergic rhinitis due to pollen 01/10/2020   Gastroesophageal reflux disease without esophagitis 01/10/2020   Flow murmur 07/28/2018   Family history of diabetes mellitus 09/22/2017   Age-related cataract of both eyes 09/22/2017   Eczema 09/22/2017   Rosacea 05/19/2014   Multiple lipomas 05/19/2014   Family history of ischemic heart disease (IHD) 09/04/2011   Hyperlipidemia with target LDL less than 70 09/04/2011   ED (erectile dysfunction) 09/04/2011   HALLUX RIGIDUS 07/11/2008   Past Medical History:  Diagnosis Date   Dyslipidemia    ED (erectile dysfunction)    Hyperlipidemia    Family History  Problem Relation Age of Onset   Heart disease Father 55   Transient ischemic attack Father    Stroke Mother    Diabetes Sister    Hypertension Neg Hx    Past Surgical History:  Procedure Laterality Date   COLONOSCOPY     x2   EXCISION MASS ABDOMINAL Left    angiolipoma   HERNIA REPAIR  06/16/12   [facility]   INGUINAL HERNIA REPAIR  06/16/2012   Procedure: HERNIA REPAIR INGUINAL ADULT;  Surgeon: Valarie Merino, MD;  Location: [facility];  Service: General;  Laterality: Left;  left inguinal hernia repair with mesh   Social History   Occupational History   Not on file  Tobacco Use   Smoking status: Never   Smokeless tobacco: Never  Vaping Use   Vaping Use: Never used  Substance and Sexual Activity   Alcohol use: Yes    Comment: wine of weekends   Drug use: No   Sexual activity: Yes

## 2023-02-04 ENCOUNTER — Ambulatory Visit: Payer: Medicare Other | Admitting: Orthopaedic Surgery

## 2023-03-04 ENCOUNTER — Other Ambulatory Visit: Payer: Self-pay | Admitting: Internal Medicine

## 2023-03-04 ENCOUNTER — Encounter: Payer: Self-pay | Admitting: Family Medicine

## 2023-03-04 DIAGNOSIS — E785 Hyperlipidemia, unspecified: Secondary | ICD-10-CM

## 2023-03-10 ENCOUNTER — Other Ambulatory Visit: Payer: Medicare Other

## 2023-03-12 ENCOUNTER — Other Ambulatory Visit: Payer: Medicare Other

## 2023-03-12 DIAGNOSIS — E785 Hyperlipidemia, unspecified: Secondary | ICD-10-CM

## 2023-03-12 LAB — LIPID PANEL
Chol/HDL Ratio: 2.4 ratio (ref 0.0–5.0)
Cholesterol, Total: 133 mg/dL (ref 100–199)
HDL: 56 mg/dL (ref >39)
LDL Chol Calc (NIH): 65 mg/dL (ref 0–99)
Triglycerides: 53 mg/dL (ref 0–149)
VLDL Cholesterol Cal: 12 mg/dL (ref 5–40)

## 2023-03-13 ENCOUNTER — Encounter: Payer: Self-pay | Admitting: Family Medicine

## 2023-03-29 ENCOUNTER — Ambulatory Visit (HOSPITAL_COMMUNITY)
Admission: EM | Admit: 2023-03-29 | Discharge: 2023-03-29 | Disposition: A | Discharge status: Home / Self Care | Payer: Medicare Other | Attending: Emergency Medicine | Admitting: Emergency Medicine

## 2023-03-29 ENCOUNTER — Encounter (HOSPITAL_COMMUNITY): Payer: Self-pay | Admitting: *Deleted

## 2023-03-29 ENCOUNTER — Other Ambulatory Visit: Payer: Self-pay

## 2023-03-29 DIAGNOSIS — B9689 Other specified bacterial agents as the cause of diseases classified elsewhere: Secondary | ICD-10-CM

## 2023-03-29 DIAGNOSIS — J069 Acute upper respiratory infection, unspecified: Secondary | ICD-10-CM

## 2023-03-29 MED ORDER — AMOXICILLIN-POT CLAVULANATE 875-125 MG PO TABS: 1.0000 | ORAL_TABLET | Freq: Two times a day (BID) | ORAL | 0 refills | Status: DC

## 2023-03-29 NOTE — ED Triage Notes (Signed)
Pt reports a cough and sore throat for over a week. Pt did home COVID test that were neg.

## 2023-03-29 NOTE — ED Provider Notes (Signed)
[facility]    CSN: [id number] Arrival date & time: 03/29/23  1430      History   Chief Complaint Chief Complaint  Patient presents with   Cough   Sore Throat    HPI Edwin Jimenez is a 72 y.o. male.   Patient presents to clinic for complaints of a cough and sore throat with nasal congestion that have been ongoing for the past week.  Cough is nonproductive.  Reports sore throat has been continuous.  [place] has [place] the beach for the past week, has taken 2 COVID test that were negative.  Denies any recent sick contacts.  Reports he is a runner, was able to complete his workout, however, it was more difficult.  Denies any shortness of breath or dizziness.  Cough has been keeping him and his wife up at night.  Has tried Sudafed and [employer] without much relief.  Denies fever, has been having some fatigue.   The history is provided by the patient and medical records.  Cough Associated symptoms: rhinorrhea and sore throat   Associated symptoms: no fever, no shortness of breath and no wheezing   Sore Throat Pertinent negatives include no shortness of breath.    Past Medical History:  Diagnosis Date   Dyslipidemia    ED (erectile dysfunction)    Hyperlipidemia     Patient Active Problem List   Diagnosis Date Noted   Benign prostatic hyperplasia with nocturia 11/01/2021   Seasonal allergic rhinitis due to pollen 01/10/2020   Gastroesophageal reflux disease without esophagitis 01/10/2020   Flow murmur 07/28/2018   Family history of diabetes mellitus 09/22/2017   Age-related cataract of both eyes 09/22/2017   Eczema 09/22/2017   Rosacea 05/19/2014   Multiple lipomas 05/19/2014   Family history of ischemic heart disease (IHD) 09/04/2011   Hyperlipidemia with target LDL less than 70 09/04/2011   ED (erectile dysfunction) 09/04/2011   HALLUX RIGIDUS 07/11/2008    Past Surgical History:  Procedure Laterality Date   COLONOSCOPY     x2   EXCISION  MASS ABDOMINAL Left    angiolipoma   HERNIA REPAIR  06/16/12   [facility]   INGUINAL HERNIA REPAIR  06/16/2012   Procedure: HERNIA REPAIR INGUINAL ADULT;  Surgeon: Valarie Merino, MD;  Location: [facility];  Service: General;  Laterality: Left;  left inguinal hernia repair with mesh       Home Medications    Prior to Admission medications   Medication Sig Start Date End Date Taking? Authorizing Provider  amoxicillin-clavulanate (AUGMENTIN) 875-125 MG tablet Take 1 tablet by mouth every 12 (twelve) hours. 03/29/23  Yes Rinaldo Ratel, [place], FNP  atorvastatin (LIPITOR) 40 MG tablet Take 1 tablet (40 mg total) by mouth daily. 12/26/22  Yes Ronnald Nian, MD  augmented betamethasone dipropionate (DIPROLENE-AF) 0.05 % cream Apply 1 application topically as needed.    [provider]  Coenzyme Q10 (CO Q10) 200 MG CAPS Take 1 capsule by mouth daily.    [provider]  finasteride (PROSCAR) 5 MG tablet Take 5 mg by mouth daily. 11/12/21   [provider]  fish oil-omega-3 fatty acids 1000 MG capsule Take 2 g by mouth daily.      [provider]  tadalafil (CIALIS) 20 MG tablet Take 1 tablet (20 mg total) by mouth daily as needed for erectile dysfunction. 01/10/20   Ronnald Nian, MD    Family History Family History  Problem Relation Age  of Onset   Heart disease Father 89   Transient ischemic attack Father    Stroke Mother    Diabetes Sister    Hypertension Neg Hx     Social History Social History   Tobacco Use   Smoking status: Never   Smokeless tobacco: Never  Vaping Use   Vaping Use: Never used  Substance Use Topics   Alcohol use: Yes    Comment: wine of weekends   Drug use: No     Allergies   Patient has no known allergies.   Review of Systems Review of Systems  Constitutional:  Negative for fever.  HENT:  Positive for congestion, postnasal drip, rhinorrhea and sore throat.   Respiratory:  Positive for cough. Negative for  shortness of breath and wheezing.      Physical Exam Triage Vital Signs ED Triage Vitals  Enc Vitals Group     BP 03/29/23 1547 (!) 154/81     Pulse Rate 03/29/23 1547 (!) 54     Resp 03/29/23 1547 18     Temp 03/29/23 1547 98.3 F (36.8 C)     Temp src --      SpO2 03/29/23 1547 97 %     Weight --      Height --      Head Circumference --      Peak Flow --      Pain Score 03/29/23 1545 3     Pain Loc --      Pain Edu? --      Excl. in GC? --    No data found.  Updated Vital Signs BP (!) 154/81   Pulse (!) 54   Temp 98.3 F (36.8 C)   Resp 18   SpO2 97%   Visual Acuity Right Eye Distance:   Left Eye Distance:   Bilateral Distance:    Right Eye Near:   Left Eye Near:    Bilateral Near:     Physical Exam Vitals and nursing note reviewed.  Constitutional:      Appearance: Normal appearance. He is well-developed.  HENT:     Head: Normocephalic and atraumatic.     Right Ear: External ear normal.     Left Ear: External ear normal.     Nose: Congestion and rhinorrhea present.     Mouth/Throat:     Mouth: Mucous membranes are moist.     Pharynx: Uvula midline. Posterior oropharyngeal erythema present.     Tonsils: No tonsillar exudate or tonsillar abscesses. 0 on the right. 0 on the left.  Eyes:     Conjunctiva/sclera: Conjunctivae normal.  Cardiovascular:     Rate and Rhythm: Regular rhythm. Bradycardia present.     Heart sounds: Normal heart sounds. No murmur heard. Pulmonary:     Effort: Pulmonary effort is normal. No respiratory distress.     Breath sounds: Normal breath sounds. No wheezing or rales.  Musculoskeletal:        General: No swelling. Normal range of motion.     Cervical back: Normal range of motion.  Skin:    General: Skin is warm.  Neurological:     General: No focal deficit present.     Mental Status: He is alert.  Psychiatric:        Mood and Affect: Mood normal.        Behavior: Behavior is cooperative.      UC Treatments /  Results  Labs (all labs ordered are listed, but only abnormal  results are displayed) Labs Reviewed - No data to display  EKG   Radiology No results found.  Procedures Procedures (including critical care time)  Medications Ordered in UC Medications - No data to display  Initial Impression / Assessment and Plan / UC Course  I have reviewed the triage vital signs and the nursing notes.  Pertinent labs & imaging results that were available during my care of the patient were reviewed by me and considered in my medical decision making (see chart for details).  Vitals and triage reviewed, patient is hemodynamically stable.  Ongoing cough with sinus congestion for past week.  Lungs are vesicular posteriorly, oxygenation stable on room air.  Patient is fit and active, non-smoker.  Will cover with Augmentin for bacterial upper respiratory tract infection.  Symptomatic management discussed.  Plan of care, follow-up care and return precautions given, no questions at this time.     Final Clinical Impressions(s) / UC Diagnoses   Final diagnoses:  Bacterial upper respiratory infection   Discharge Instructions   None    ED Prescriptions     Medication Sig Dispense Auth. Provider   amoxicillin-clavulanate (AUGMENTIN) 875-125 MG tablet Take 1 tablet by mouth every 12 (twelve) hours. 14 tablet Eymi Lipuma, [place], [place]      PDMP not reviewed this encounter.   Rinaldo Ratel [place], [place] 03/29/23 [phone]

## 2023-04-03 ENCOUNTER — Encounter: Payer: Self-pay | Admitting: Family Medicine

## 2023-04-08 ENCOUNTER — Emergency Department (HOSPITAL_BASED_OUTPATIENT_CLINIC_OR_DEPARTMENT_OTHER)
Admission: EM | Admit: 2023-04-08 | Discharge: 2023-04-08 | Disposition: A | Discharge status: Home / Self Care | Payer: Medicare Other | Attending: Emergency Medicine | Admitting: Emergency Medicine

## 2023-04-08 ENCOUNTER — Encounter (HOSPITAL_BASED_OUTPATIENT_CLINIC_OR_DEPARTMENT_OTHER): Payer: Self-pay

## 2023-04-08 ENCOUNTER — Emergency Department (HOSPITAL_BASED_OUTPATIENT_CLINIC_OR_DEPARTMENT_OTHER): Payer: Medicare Other

## 2023-04-08 ENCOUNTER — Other Ambulatory Visit: Payer: Self-pay

## 2023-04-08 DIAGNOSIS — R1031 Right lower quadrant pain: Secondary | ICD-10-CM | POA: Diagnosis present

## 2023-04-08 LAB — URINALYSIS, ROUTINE W REFLEX MICROSCOPIC
Bacteria, UA: NONE SEEN
Bilirubin Urine: NEGATIVE
Glucose, UA: NEGATIVE mg/dL
Ketones, ur: 40 mg/dL — AB
Leukocytes,Ua: NEGATIVE
Nitrite: NEGATIVE
Specific Gravity, Urine: >1.046 — ABNORMAL HIGH (ref 1.005–1.030)
pH: 6 (ref 5.0–8.0)

## 2023-04-08 LAB — COMPREHENSIVE METABOLIC PANEL
ALT: 25 U/L (ref 0–44)
AST: 31 U/L (ref 15–41)
Albumin: 4.4 g/dL (ref 3.5–5.0)
Alkaline Phosphatase: 53 U/L (ref 38–126)
Anion gap: 11 (ref 5–15)
BUN: 30 mg/dL — ABNORMAL HIGH (ref 8–23)
CO2: 24 mmol/L (ref 22–32)
Calcium: 10 mg/dL (ref 8.9–10.3)
Chloride: 106 mmol/L (ref 98–111)
Creatinine, Ser: 1.14 mg/dL (ref 0.61–1.24)
GFR, Estimated: >60 mL/min (ref >60)
Glucose, Bld: 112 mg/dL — ABNORMAL HIGH (ref 70–99)
Potassium: 4.5 mmol/L (ref 3.5–5.1)
Sodium: 141 mmol/L (ref 135–145)
Total Bilirubin: 0.5 mg/dL (ref 0.3–1.2)
Total Protein: 7.1 g/dL (ref 6.5–8.1)

## 2023-04-08 LAB — CBC
HCT: 47.8 % (ref 39.0–52.0)
Hemoglobin: 16.2 g/dL (ref 13.0–17.0)
MCH: 32.6 pg (ref 26.0–34.0)
MCHC: 33.9 g/dL (ref 30.0–36.0)
MCV: 96.2 fL (ref 80.0–100.0)
Platelets: 191 10*3/uL (ref 150–400)
RBC: 4.97 MIL/uL (ref 4.22–5.81)
RDW: 12.7 % (ref 11.5–15.5)
WBC: 15.6 10*3/uL — ABNORMAL HIGH (ref 4.0–10.5)
nRBC: 0 % (ref 0.0–0.2)

## 2023-04-08 LAB — LIPASE, BLOOD: Lipase: 18 U/L (ref 11–51)

## 2023-04-08 MED ORDER — LACTATED RINGERS IV BOLUS
1000.0000 mL | Freq: Once | INTRAVENOUS | Status: AC
  Administered 2023-04-08: 1000 mL via INTRAVENOUS

## 2023-04-08 MED ORDER — IOHEXOL 300 MG/ML  SOLN
100.0000 mL | Freq: Once | INTRAMUSCULAR | Status: AC | PRN
  Administered 2023-04-08: 80 mL via INTRAVENOUS

## 2023-04-08 MED ORDER — MORPHINE SULFATE (PF) 4 MG/ML IV SOLN
6.0000 mg | INTRAVENOUS | Status: DC | PRN
  Administered 2023-04-08: 6 mg via INTRAVENOUS

## 2023-04-08 MED ORDER — ONDANSETRON HCL 4 MG/2ML IJ SOLN
4.0000 mg | Freq: Once | INTRAMUSCULAR | Status: AC
  Administered 2023-04-08: 4 mg via INTRAVENOUS
  Filled 2023-04-08: qty 2

## 2023-04-08 MED ORDER — MORPHINE SULFATE (PF) 4 MG/ML IV SOLN
6.0000 mg | INTRAVENOUS | Status: DC | PRN
  Filled 2023-04-08: qty 2

## 2023-04-08 NOTE — ED Triage Notes (Signed)
Patient here POV from Home.  Endorses ABD Pain (was originally across the mid abd but now mainly RLQ).   Some nausea. No emesis or Diarrhea.   Uncomfortable during triage, A&Ox4. GCS 15. BIB Wheelchair.

## 2023-04-08 NOTE — ED Provider Notes (Signed)
Edwin Jimenez Provider Note   CSN: [id number] Arrival date & time: 04/08/23  1701     History Chief Complaint  Patient presents with   Abdominal Pain    HPI Edwin Jimenez is a 72 y.o. male presenting for abdominal pain in the right lower quadrant.  States it is 10 out of 10 on arrival.  History of severe abdominal pain but this episode seems much worse.  States it has been associate with inability to urinate. Denies fevers or chills, nausea vomiting, syncope shortness of breath.  Pain is excruciating on arrival.  Radiates to his right groin..   Patient's recorded medical, surgical, social, medication list and allergies were reviewed in the Snapshot window as part of the initial history.   Review of Systems   Review of Systems  Constitutional:  Negative for chills and fever.  HENT:  Negative for ear pain and sore throat.   Eyes:  Negative for pain and visual disturbance.  Respiratory:  Negative for cough and shortness of breath.   Cardiovascular:  Negative for chest pain and palpitations.  Gastrointestinal:  Positive for abdominal pain. Negative for vomiting.  Genitourinary:  Negative for dysuria and hematuria.  Musculoskeletal:  Negative for arthralgias and back pain.  Skin:  Negative for color change and rash.  Neurological:  Negative for seizures and syncope.  All other systems reviewed and are negative.   Physical Exam Updated Vital Signs BP (!) 150/63   Pulse (!) 55   Temp 99.2 F (37.3 C)   Resp 20   Ht 5\' 7"  (1.702 m)   Wt 59.9 kg   SpO2 92%   BMI 20.67 kg/m  Physical Exam Vitals and nursing note reviewed.  Constitutional:      General: He is not in acute distress.    Appearance: He is well-developed.  HENT:     Head: Normocephalic and atraumatic.  Eyes:     Conjunctiva/sclera: Conjunctivae normal.  Cardiovascular:     Rate and Rhythm: Normal rate and regular rhythm.     Heart sounds: No murmur  heard. Pulmonary:     Effort: Pulmonary effort is normal. No respiratory distress.     Breath sounds: Normal breath sounds.  Abdominal:     Palpations: Abdomen is soft.     Tenderness: There is abdominal tenderness in the right lower quadrant, periumbilical area and suprapubic area. There is no right CVA tenderness.  Musculoskeletal:        General: No swelling.     Cervical back: Neck supple.  Skin:    General: Skin is warm and dry.     Capillary Refill: Capillary refill takes less than 2 seconds.  Neurological:     Mental Status: He is alert.  Psychiatric:        Mood and Affect: Mood normal.      ED Course/ Medical Decision Making/ A&P    Procedures Procedures   Medications Ordered in ED Medications  morphine (PF) 4 MG/ML injection 6 mg (6 mg Intravenous Given 04/08/23 2004)  lactated ringers bolus 1,000 mL (0 mLs Intravenous Stopped 04/08/23 2122)  ondansetron (ZOFRAN) injection 4 mg (4 mg Intravenous Given 04/08/23 2002)  iohexol (OMNIPAQUE) 300 MG/ML solution 100 mL (80 mLs Intravenous Contrast Given 04/08/23 2031)   Medical Decision Making:   Edwin Jimenez is a 72 y.o. male who presented to the ED today with abdominal pain, detailed above.    Patient's presentation is complicated by their history  of multiple comorbid medical problems [facility].  Patient placed on continuous vitals and telemetry monitoring while in ED which was reviewed periodically.  Complete initial physical exam performed, notably the patient  was stable no acute distress.     Reviewed and confirmed nursing documentation for past medical history, family history, social history.    Initial Assessment:   With the patient's presentation of abdominal pain, most likely diagnosis is nonspecific etiology. Other diagnoses were considered including (but not limited to) gastroenteritis, colitis, small bowel obstruction, appendicitis, cholecystitis, pancreatitis, nephrolithiasis, UTI, pyleonephritis. These are  considered less likely due to history of present illness and physical exam findings.   This is most consistent with an acute life/limb threatening illness complicated by underlying chronic conditions.   Initial Plan:  CBC/CMP to evaluate for underlying infectious/metabolic etiology for patient's abdominal pain  Lipase to evaluate for pancreatitis  EKG to evaluate for cardiac source of pain  CTAB/Pelvis with contrast to evaluate for structural/surgical etiology of patients' severe abdominal pain.  Urinalysis and repeat physical assessment to evaluate for UTI/Pyelonpehritis  Empiric management of symptoms with escalating pain control and antiemetics as needed.   Initial Study Results:   Laboratory  All laboratory results reviewed without evidence of clinically relevant pathology.      EKG EKG was reviewed independently. Rate, rhythm, axis, intervals all examined and without medically relevant abnormality. ST segments without concerns for elevations.    Radiology All images reviewed independently. Agree with radiology report at this time.   CT ABDOMEN PELVIS W CONTRAST  Result Date: 04/08/2023 CLINICAL DATA:  Abdominal pain. EXAM: CT ABDOMEN AND PELVIS WITH CONTRAST TECHNIQUE: Multidetector CT imaging of the abdomen and pelvis was performed using the standard protocol following bolus administration of intravenous contrast. RADIATION DOSE REDUCTION: This exam was performed according to the departmental dose-optimization program which includes automated exposure control, adjustment of the mA and/or kV according to patient size and/or use of iterative reconstruction technique. CONTRAST:  80mL OMNIPAQUE IOHEXOL 300 MG/ML  SOLN COMPARISON:  CT abdomen pelvis dated 11/09/2022. chest CT dated 01/12/2021. FINDINGS: Lower chest: Partially visualized 6 mm nodule in the right lower lobe likely corresponding to the calcified granuloma seen on the chest CT of 01/12/2021. The visualized lung bases are otherwise  clear. No intra-abdominal free air or free fluid. Hepatobiliary: The liver is unremarkable. No biliary dilatation. The gallbladder is unremarkable. Pancreas: Unremarkable. No pancreatic ductal dilatation or surrounding inflammatory changes. Spleen: Normal in size without focal abnormality. Adrenals/Urinary Tract: The adrenal glands are unremarkable. There is mild dilatation of the right kidney. There is extrarenal pelvis on the right. There is mild right hydronephrosis. No stone identified. A transition is seen at the ureteropelvic junction. Underlying urothelial lesion or stricture is not excluded. There is edema and stranding of the right perinephric fat suspicious for pyelonephritis. Correlation with urinalysis recommended. Multiple bilateral renal cysts. No imaging follow-up. There is no hydronephrosis on the left. The left ureter and urinary bladder appear unremarkable. Stomach/Bowel: There is no bowel obstruction or active inflammation. The appendix is normal. Vascular/Lymphatic: Mild aortoiliac atherosclerotic disease. The IVC is unremarkable. No portal venous gas. There is no adenopathy. Reproductive: Mildly enlarged prostate gland with median lobe hypertrophy indenting the base of the bladder. Other: None Musculoskeletal: Osteopenia with degenerative changes of the spine. No acute osseous pathology. IMPRESSION: 1. Mild right hydronephrosis with a transition at the ureteropelvic junction. No stone identified. Underlying urothelial lesion or stricture is not excluded. Correlation with urinalysis recommended to evaluate for possibility of  superimposed pyelonephritis. 2. No bowel obstruction. Normal appendix. 3.  Aortic Atherosclerosis (ICD10-I70.0). Electronically Signed   By: Elgie Collard M.D.   On: 04/08/2023 20:53   .  Final Reassessment and Plan:   Objective findings with no acute pathology.  Pain is completely resolved after being in the emergency department. CT scan does show a possible urethral  stricture but clinically patient is completely resolved at this time has been able to urinate since his CT scan was done with resolution of his symptoms. Given resolution of symptoms no acute AKI, possibly intermittent urethral stricture.  Patient states he had a history of similar that they continued stenting but he was not symptomatic from the disease at that time. Given interval resolution, patient stable to follow-up with urology in the outpatient setting for possible consideration for stenting. Strict return precautions regarding interval worsening reinforced. On this presentation, irritable bowel syndrome does remain the alternative diagnosis, but currently urologic pathology seems more consistent.  Clinical Impression:  1. Right lower quadrant abdominal pain      Data Unavailable   Final Clinical Impression(s) / ED Diagnoses Final diagnoses:  Right lower quadrant abdominal pain    Rx / DC Orders ED Discharge Orders     None         Glyn Ade, MD 04/08/23 2254

## 2023-04-16 ENCOUNTER — Other Ambulatory Visit: Payer: Self-pay | Admitting: Family Medicine

## 2023-04-16 DIAGNOSIS — J029 Acute pharyngitis, unspecified: Secondary | ICD-10-CM

## 2023-04-17 ENCOUNTER — Other Ambulatory Visit: Payer: Self-pay | Admitting: Urology

## 2023-04-19 ENCOUNTER — Emergency Department (HOSPITAL_COMMUNITY): Payer: Medicare Other

## 2023-04-19 ENCOUNTER — Encounter (HOSPITAL_COMMUNITY): Payer: Self-pay

## 2023-04-19 ENCOUNTER — Other Ambulatory Visit: Payer: Self-pay

## 2023-04-19 ENCOUNTER — Emergency Department (HOSPITAL_COMMUNITY)
Admission: EM | Admit: 2023-04-19 | Discharge: 2023-04-19 | Disposition: A | Discharge status: Home / Self Care | Payer: Medicare Other | Attending: Emergency Medicine | Admitting: Emergency Medicine

## 2023-04-19 DIAGNOSIS — R079 Chest pain, unspecified: Secondary | ICD-10-CM | POA: Diagnosis not present

## 2023-04-19 DIAGNOSIS — R0602 Shortness of breath: Secondary | ICD-10-CM | POA: Diagnosis not present

## 2023-04-19 DIAGNOSIS — R002 Palpitations: Secondary | ICD-10-CM | POA: Insufficient documentation

## 2023-04-19 LAB — TROPONIN I (HIGH SENSITIVITY)
Troponin I (High Sensitivity): 11 ng/L (ref <18)
Troponin I (High Sensitivity): 14 ng/L (ref <18)

## 2023-04-19 LAB — BASIC METABOLIC PANEL
Anion gap: 11 (ref 5–15)
BUN: 26 mg/dL — ABNORMAL HIGH (ref 8–23)
CO2: 21 mmol/L — ABNORMAL LOW (ref 22–32)
Calcium: 9.2 mg/dL (ref 8.9–10.3)
Chloride: 108 mmol/L (ref 98–111)
Creatinine, Ser: 0.88 mg/dL (ref 0.61–1.24)
GFR, Estimated: >60 mL/min (ref >60)
Glucose, Bld: 138 mg/dL — ABNORMAL HIGH (ref 70–99)
Potassium: 4 mmol/L (ref 3.5–5.1)
Sodium: 140 mmol/L (ref 135–145)

## 2023-04-19 LAB — CBC
HCT: 44.2 % (ref 39.0–52.0)
Hemoglobin: 14.5 g/dL (ref 13.0–17.0)
MCH: 31.9 pg (ref 26.0–34.0)
MCHC: 32.8 g/dL (ref 30.0–36.0)
MCV: 97.4 fL (ref 80.0–100.0)
Platelets: 190 10*3/uL (ref 150–400)
RBC: 4.54 MIL/uL (ref 4.22–5.81)
RDW: 12.6 % (ref 11.5–15.5)
WBC: 7.2 10*3/uL (ref 4.0–10.5)
nRBC: 0 % (ref 0.0–0.2)

## 2023-04-19 MED ORDER — ASPIRIN 81 MG PO CHEW: 324.0000 mg | CHEWABLE_TABLET | Freq: Once | ORAL | Status: DC

## 2023-04-19 NOTE — ED Provider Notes (Addendum)
[name]   CSN: [id number] Arrival date & time: 04/19/23  1157     History  Chief Complaint  Patient presents with   Chest Pain    Edwin Jimenez is a 72 y.o. male.  HPI     Pt with a history of hyperlipidemia and prior occasional palpitations presents to the ED today via EMS with chief complaint of chest pain, SOB and palpitations. The pain started this morning, about 3 or 4 miles into his run which started at 7:30 AM. He is a daily runner but today he felt palpitations and more SOB than is normal for him and he had to keep stopping to catch his breath. He went home and ate and his wife grew concerned about him and called EMS.  Per wife, even at home patient was intermittently having palpitations and feeling short of breath.  Per EMS, patient was clammy.  At rest his CP has resolved. He has a family history of father who died at age 65 of MI and mother died of stroke. He is in the care of a cardiologist who he visits every 2 years.   Home Medications Prior to Admission medications   Medication Sig Start Date End Date Taking? Authorizing Provider  amoxicillin-clavulanate (AUGMENTIN) 875-125 MG tablet Take 1 tablet by mouth every 12 (twelve) hours. 03/29/23   Garrison, [place] N, FNP  atorvastatin (LIPITOR) 40 MG tablet Take 1 tablet (40 mg total) by mouth daily. 12/26/22   Ronnald Nian, MD  augmented betamethasone dipropionate (DIPROLENE-AF) 0.05 % cream Apply 1 application topically as needed.    [provider]  Coenzyme Q10 (CO Q10) 200 MG CAPS Take 1 capsule by mouth daily.    [provider]  finasteride (PROSCAR) 5 MG tablet Take 5 mg by mouth daily. 11/12/21   [provider]  fish oil-omega-3 fatty acids 1000 MG capsule Take 2 g by mouth daily.      [provider]  tadalafil (CIALIS) 20 MG tablet Take 1 tablet (20 mg total) by mouth daily as needed for erectile dysfunction.  01/10/20   Ronnald Nian, MD      Allergies    Patient has no known allergies.    Review of Systems   Review of Systems  All other systems reviewed and are negative.   Physical Exam Updated Vital Signs BP 134/70 (BP Location: Right Arm)   Pulse 62   Temp 98 F (36.7 C) (Oral)   Resp 13   Ht 5\' 7"  (1.702 m)   Wt 59.9 kg   SpO2 99%   BMI 20.67 kg/m  Physical Exam Vitals and nursing note reviewed.  Constitutional:      Appearance: He is well-developed.  HENT:     Head: Atraumatic.  Neck:     Vascular: No JVD.  Cardiovascular:     Rate and Rhythm: Normal rate.  Pulmonary:     Effort: Pulmonary effort is normal.  Musculoskeletal:     Cervical back: Neck supple.     Right lower leg: No edema.     Left lower leg: No edema.  Skin:    General: Skin is warm.  Neurological:     Mental Status: He is alert and oriented to person, place, and time.     ED Results / Procedures / Treatments   Labs (all labs ordered are listed, but only abnormal results are displayed) Labs Reviewed  BASIC METABOLIC PANEL -  Abnormal; Notable for the following components:      Result Value   CO2 21 (*)    Glucose, Bld 138 (*)    BUN 26 (*)    All other components within normal limits  CBC  TROPONIN I (HIGH SENSITIVITY)  TROPONIN I (HIGH SENSITIVITY)    EKG EKG Interpretation Date/Time:  Saturday April 19 2023 12:06:42 EDT Ventricular Rate:  68 PR Interval:  152 QRS Duration:  88 QT Interval:  414 QTC Calculation: 441 R Axis:   55  Text Interpretation: Sinus rhythm Left atrial enlargement RSR' in V1 or V2, right VCD or RVH No acute changes sublte ST elevation v4-v6 Confirmed by Derwood Kaplan [phone]) on 04/19/2023 12:28:34 PM    EKG Interpretation Date/Time:  Saturday April 19 2023 14:31:01 EDT Ventricular Rate:  66 PR Interval:  157 QRS Duration:  88 QT Interval:  418 QTC Calculation: 438 R Axis:   60  Text Interpretation: Sinus rhythm RSR' in V1 or V2, right VCD or RVH  Minimal ST elevation, inferior leads No acute changes Confirmed by Derwood Kaplan [phone]) on 04/19/2023 3:49:53 PM            Radiology DG Chest Portable 1 View  Result Date: 04/19/2023 CLINICAL DATA:  Chest pain. EXAM: PORTABLE CHEST 1 VIEW COMPARISON:  Chest x-ray dated November 10, 2016. FINDINGS: The heart size and mediastinal contours are within normal limits. Normal pulmonary vascularity. No focal consolidation, pleural effusion, or pneumothorax. Unchanged calcified granuloma in the right lower lobe. No acute osseous abnormality. IMPRESSION: No active disease. Electronically Signed   By: Obie Dredge M.D.   On: 04/19/2023 13:06    Procedures Procedures    Medications Ordered in ED Medications - No data to display  ED Course/ Medical Decision Making/ A&P Clinical Course as of 04/19/23 1742  Sat Apr 19, 2023  1715 Patient reassessed on 2 separate occasions.  He indicated to me that even while in the ED, he has had few episodes of palpitations.  Upon independent assessment of the telemetry strip, there has not been a single alarm.  Upon looking at the telemetry strip closely, it appears that he has had at least 3 or 4 episodes where there were questionable PVCs, or a sinus pause.  No concerning arrhythmia such as VT. no clear evidence of A-fib/flutter.  Delta troponin is reassuring.  Plan is to send a message to Dr. Clarisse Gouge for a close follow-up.  We will also put in a cardiology referral.  I suspect that patient might benefit with Holter monitor and plus or minus echocardiogram or stress echo.  Overall lower suspicion for ACS. Return precautions discussed with the patient and his wife. [AN]    Clinical Course User Index [AN] Derwood Kaplan, MD                             Medical Decision Making This patient presents to the ED with chief complaint(s) of palpitations, shortness of breath with pertinent past medical history of hyperlipidemia and family history of heart  attack.The complaint involves an extensive differential diagnosis and also carries with it a high risk of complications and morbidity.    The differential diagnosis includes : Arrhythmia/tacky dysrhythmia such as PVCs, PAC, PSVT, a flutter, nonsustained VT, severe electrolyte abnormality, ACS, stable angina.  The initial plan is to get basic labs including troponin x 2.  We will place patient on cardiac monitor. Patient informs me that  he really did not have chest pain, it was more of a flutter type feeling and with that he had shortness of breath. He is unsure dizziness, but did not feel at any point and that he had near fainting.   Additional history obtained: Additional history obtained from family Records reviewed previous cardiac workup -including echocardiogram that was normal.  Last cardiology visit was 2022.  Independent labs interpretation:  The following labs were independently interpreted: Initial labs include CBC, metabolic profile and initial high-sensitivity troponin are all reassuring.  Independent visualization and interpretation of imaging: - I independently visualized the following imaging with scope of interpretation limited to determining acute life threatening conditions related to emergency care: X-ray of the chest, which revealed no evidence of pulmonary edema, pneumonia, pneumothorax.  Treatment and Reassessment: Please see the workup tab.  Amount and/or Complexity of Data Reviewed Labs: ordered. Radiology: ordered.   Final Clinical Impression(s) / ED Diagnoses Final diagnoses:  Palpitations    Rx / DC Orders ED Discharge Orders          Ordered    Ambulatory referral to Cardiology       Comments: If you have not heard from the Cardiology office within the next 72 hours please call [phone].   04/19/23 1712             Derwood Kaplan, MD 04/19/23 1918

## 2023-04-19 NOTE — ED Triage Notes (Signed)
Patient BIB [facility] from home for chest pain starting during his normal run this morning. Patient had to stop multiple times throughout due to discomfort. Patient received 324mg  aspirin en route. No pain at rest, but reports intermittent fluttering during the morning. A&Ox4, VSS.

## 2023-04-19 NOTE — Discharge Instructions (Addendum)
We saw you in the ER for palpitations. All the results in the ER are normal, labs and imaging.  Our cardiac telemetry monitoring did not have any alarms, but upon looking close it appears that you have had episodes of extra beats. We are not sure what is causing your symptoms.  We recommend that you follow-up with a cardiologist as soon as possible.  Please return to the ER if you have worsening chest pain, shortness of breath, pain radiating to your jaw, shoulder, or back, sweats or fainting.  Also return to the emergency room if you start having severe palpitations with associated dizziness, near fainting, chest pain.  Avoid high intensity exercise.

## 2023-04-21 ENCOUNTER — Encounter (HOSPITAL_BASED_OUTPATIENT_CLINIC_OR_DEPARTMENT_OTHER): Payer: Self-pay | Admitting: Family

## 2023-04-21 ENCOUNTER — Telehealth (HOSPITAL_BASED_OUTPATIENT_CLINIC_OR_DEPARTMENT_OTHER): Payer: Self-pay

## 2023-04-21 ENCOUNTER — Other Ambulatory Visit (HOSPITAL_BASED_OUTPATIENT_CLINIC_OR_DEPARTMENT_OTHER): Payer: Medicare Other

## 2023-04-21 ENCOUNTER — Ambulatory Visit (HOSPITAL_BASED_OUTPATIENT_CLINIC_OR_DEPARTMENT_OTHER): Payer: Medicare Other | Admitting: Family

## 2023-04-21 VITALS — BP 124/72 | HR 56 | Ht 67.0 in | Wt 129.4 lb

## 2023-04-21 DIAGNOSIS — R011 Cardiac murmur, unspecified: Secondary | ICD-10-CM | POA: Diagnosis not present

## 2023-04-21 DIAGNOSIS — I25118 Atherosclerotic heart disease of native coronary artery with other forms of angina pectoris: Secondary | ICD-10-CM | POA: Diagnosis not present

## 2023-04-21 DIAGNOSIS — E785 Hyperlipidemia, unspecified: Secondary | ICD-10-CM

## 2023-04-21 DIAGNOSIS — R002 Palpitations: Secondary | ICD-10-CM

## 2023-04-21 NOTE — Progress Notes (Signed)
Cardiology Office Note:  .   Date:  04/21/2023  ID:  Edwin Jimenez, DOB Apr 29, 1951, MRN [id number] PCP: Ronnald Nian, MD  [facility] Providers Cardiologist:  Jodelle Red, MD    History of Present Illness: .   Edwin Jimenez is a 72 y.o. male with history of coronary artery disease, hyperlipidemia.  Family history of CAD.  Echocardiogram 04/2018 normal LVEF 55-60%, grade 1 diastolic dysfunction, aortic sclerosis without stenosis.  Coronary calcium score 08/11/2018 with CAC 14.6 placing him on 29th percentile for age and sex matched control.  Last seen 2022 noting orthostatic lightheadedness, dizziness.  And was encouraged gradual position changes, adequate hydration.  ED visit 04/19/23 with chest pain, shortness of breath, palpitations. Palpitations started 3-4 miles into his run. HS troponin x2 unremarkable. EKG with NSR 66 bpm with upsloping T wave in V4-6. Telemetry with occasional PVC.    He presents today for follow up. 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 He has been a runner for 50 years. Notes he generally does not warm up but gradually increases pace. More often as soon as he starts will feel his heart racing and he has to stop and walk for a bit. Previously had stress test, stress echo in [place] has been previously diagnosed with premature beat. Drinks two 12 oz cups of coffee and share he is not a big water drinker. Of note he is pending cystoscopy with right uretral stent placement 05/07/23 but shares this procedure is not emergent.   ROS: Please see the history of present illness.    All other systems reviewed and are negative.   Studies Reviewed: .        Cardiac Studies & Procedures       ECHOCARDIOGRAM  ECHOCARDIOGRAM COMPLETE 05/04/2018  Narrative *[facility]* [address] [place], [place]  [address] [phone]  ------------------------------------------------------------------- Transthoracic Echocardiography  Patient:    Edwin, Jimenez MR #:       [id number] Study Date: 05/04/2018 Gender:     M Age:        72 Height:     171.5 cm Weight:     60.8 kg BSA:        1.7 m^2 Pt. Status: Room:  SONOGRAPHER  Barling, Will ATTENDING    Zoila Shutter MD PERFORMING   [facility] [id number] Janae Sauce [id number]  cc:  ------------------------------------------------------------------- LV EF: 55% -   60%  ------------------------------------------------------------------- Indications:      (R01.1).  ------------------------------------------------------------------- History:   PMH:  Acquired from the patient and from the patient&'s chart.  Murmur.  Risk factors:  Hypertension. Dyslipidemia.  ------------------------------------------------------------------- Study Conclusions  - Left ventricle: The cavity size was normal. Wall thickness was normal. Systolic function was normal. The estimated ejection fraction was in the range of 55% to 60%. Wall motion was normal; there were no regional wall motion abnormalities. Doppler parameters are consistent with abnormal left ventricular relaxation (grade 1 diastolic dysfunction).  Impressions:  - Normal LV systolic function; mild diastolic dysfunction; sclerotic aortic valve.  ------------------------------------------------------------------- Study data:  No prior study was available for comparison.  Study status:  Routine.  Procedure:  The patient reported no pain pre or post test. Transthoracic echocardiography for left ventricular function evaluation and for assessment of valvular function. Image quality was adequate.  Study completion:  There were no complications.          Transthoracic echocardiography.  M-mode, complete 2D, spectral Doppler, and color Doppler.   Birthdate: Patient birthdate: [birth year].  Age:  Patient is 72 yr old.  Sex: Gender: male.    BMI: 20.7 kg/m^2.  Blood pressure:     124/70 Patient status:  Outpatient.  Study date:  Study date: 05/04/2018. Study time: 02:23 PM.  Location:  [facility]  -------------------------------------------------------------------  ------------------------------------------------------------------- Left ventricle:  The cavity size was normal. Wall thickness was normal. Systolic function was normal. The estimated ejection fraction was in the range of 55% to 60%. Wall motion was normal; there were no regional wall motion abnormalities. Doppler parameters are consistent with abnormal left ventricular relaxation (grade 1 diastolic dysfunction).  ------------------------------------------------------------------- Aortic valve:   Trileaflet; mildly thickened leaflets. Mobility was not restricted.  Doppler:  Transvalvular velocity was within the normal range. There was no stenosis. There was no regurgitation.  ------------------------------------------------------------------- Aorta:  Aortic root: The aortic root was normal in size.  ------------------------------------------------------------------- Mitral valve:   Structurally normal valve.   Mobility was not restricted.  Doppler:  Transvalvular velocity was within the normal range. There was no evidence for stenosis. There was trivial regurgitation.    Peak gradient (D): 3 mm Hg.  ------------------------------------------------------------------- Left atrium:  The atrium was normal in size.  ------------------------------------------------------------------- Right ventricle:  The cavity size was normal. Systolic function was normal.  ------------------------------------------------------------------- Pulmonic valve:    Doppler:  Transvalvular velocity was within the normal range. There was no evidence for stenosis. There was  trivial regurgitation.  ------------------------------------------------------------------- Tricuspid valve:   Structurally normal valve.    Doppler: Transvalvular velocity was within the normal range. There was trivial regurgitation.  ------------------------------------------------------------------- Pulmonary artery:   Systolic pressure was at the upper limits of normal.  ------------------------------------------------------------------- Right atrium:  The atrium was normal in size.  ------------------------------------------------------------------- Pericardium:  There was no pericardial effusion.  ------------------------------------------------------------------- Systemic veins: Inferior vena cava: The vessel was normal in size.  ------------------------------------------------------------------- Measurements  Left ventricle                           Value        Reference LV ID, ED, PLAX chordal          (L)     41    mm     43 - 52 LV ID, ES, PLAX chordal                  26.8  mm     23 - 38 LV fx shortening, PLAX chordal           35    %      >=29 LV PW thickness, ED                      10.68 mm     --------- IVS/LV PW ratio, ED                      0.95         <=1.3 Stroke volume, 2D                        72    ml     --------- Stroke volume/bsa, 2D  42    ml/m^2 --------- LV ejection fraction, 1-p A4C            72    %      --------- LV end-diastolic volume, 2-p             97    ml     --------- LV end-systolic volume, 2-p              29    ml     --------- LV ejection fraction, 2-p                70    %      --------- Stroke volume, 2-p                       68    ml     --------- LV end-diastolic volume/bsa, 2-p         57    ml/m^2 --------- LV end-systolic volume/bsa, 2-p          17    ml/m^2 --------- Stroke volume/bsa, 2-p                   40    ml/m^2 --------- LV e&', lateral                           11.6  cm/s   --------- LV  E/e&', lateral                         6.85         --------- LV e&', medial                            8.48  cm/s   --------- LV E/e&', medial                          9.38         --------- LV e&', average                           10.04 cm/s   --------- LV E/e&', average                         7.92         ---------  Ventricular septum                       Value        Reference IVS thickness, ED                        10.14 mm     ---------  LVOT                                     Value        Reference LVOT ID, S                               21    mm     --------- LVOT area  3.46  cm^2   --------- LVOT ID                                  21    mm     --------- LVOT peak velocity, S                    100   cm/s   --------- LVOT mean velocity, S                    63.8  cm/s   --------- LVOT VTI, S                              20.9  cm     --------- LVOT peak gradient, S                    4     mm Hg  --------- Stroke volume (SV), LVOT DP              72.4  ml     --------- Stroke index (SV/bsa), LVOT DP           42.6  ml/m^2 ---------  Aorta                                    Value        Reference Aortic root ID, ED                       31    mm     --------- Ascending aorta ID, A-P, S               31    mm     ---------  Left atrium                              Value        Reference LA ID, A-P, ES                           38    mm     --------- LA ID/bsa, A-P                   (H)     2.24  cm/m^2 <=2.2 LA volume, S                             54    ml     --------- LA volume/bsa, S                         31.8  ml/m^2 --------- LA volume, ES, 1-p A4C                   47    ml     --------- LA volume/bsa, ES, 1-p A4C               27.7  ml/m^2 --------- LA volume, ES, 1-p A2C  61    ml     --------- LA volume/bsa, ES, 1-p A2C               35.9  ml/m^2 ---------  Mitral valve                             Value         Reference Mitral E-wave peak velocity              79.5  cm/s   --------- Mitral A-wave peak velocity              86.4  cm/s   --------- Mitral deceleration time                 190   ms     150 - 230 Mitral peak gradient, D                  3     mm Hg  --------- Mitral E/A ratio, peak                   0.9          ---------  Pulmonary arteries                       Value        Reference PA pressure, S, DP                       30    mm Hg  <=30  Tricuspid valve                          Value        Reference Tricuspid regurg peak velocity           258   cm/s   --------- Tricuspid peak RV-RA gradient            27    mm Hg  ---------  Systemic veins                           Value        Reference Estimated CVP                            3     mm Hg  ---------  Right ventricle                          Value        Reference RV pressure, S, DP                       30    mm Hg  <=30 RV s&', lateral, S                        13.3  cm/s   ---------  Legend: (L)  and  (H)  mark values outside specified reference range.  ------------------------------------------------------------------- Prepared and Electronically Authenticated by  Olga Millers 2019-07-29T15:16:29     CT SCANS  CT CARDIAC SCORING (SELF PAY ONLY) 08/11/2018  Addendum 08/11/2018  3:19 PM ADDENDUM REPORT: 08/11/2018 15:16  EXAM: OVER-READ INTERPRETATION  CT CHEST  The  following report is an over-read performed by radiologist Dr. Marinda Elk [facility] on 08/11/2018. This over-read does not include interpretation of cardiac or coronary anatomy or pathology. The coronary calcium score interpretation by the cardiologist is attached.  COMPARISON:  Chest x-ray on 11/10/2016  FINDINGS: Mediastinum/Nodes: No lymphadenopathy identified. No masses in the visualized mediastinum or hilar regions.  Lungs/Pleura: There is a 10 mm well-circumscribed and partially calcified nodule in the  posterior right lower lobe. This was seen on prior chest x-ray. This nodule is mostly calcified and appears to be consistent with either a granuloma or hamartoma. Visualized lungs show no evidence of pulmonary edema, consolidation, pneumothorax or pleural fluid.  Chest Wall: No abnormalities identified.  Upper Abdomen: No abnormalities identified.  Musculoskeletal: Visualized bony structures are unremarkable.  IMPRESSION: 10 mm well-circumscribed and mostly calcified nodule in the posterior right lower lobe. This is felt to most likely represent a benign calcified granuloma or hamartoma. This was visible by prior chest x-ray in 2018. No additional imaging is available to confirm stability beyond that time. Although felt to most likely be benign, it would be reasonable to follow the nodule with at least one CT of the chest to include the entire chest as the lungs were not entirely imaged today. A follow-up CT in approximately 6-12 months would be reasonable for this likely benign abnormality.   Electronically Signed By: Irish Lack M.D. On: 08/11/2018 15:16  Narrative CLINICAL DATA:  Risk stratification  EXAM: Coronary Calcium Score  MEDICATIONS: None.  TECHNIQUE: The patient was scanned on a [employer]. Axial non-contrast 3 mm slices were carried out through the heart. The data set was analyzed on a dedicated work station and scored using the Agatson method.  FINDINGS: Non-cardiac: See separate report from [facility].  Ascending Aorta: Normal size, minimal calcifications in the aortic root.  Pericardium: Normal.  Coronary arteries: Normal origin.  IMPRESSION: Coronary calcium score of 14.6. This was 29 percentile for age and sex matched control.  Electronically Signed: By: Tobias Alexander On: 08/11/2018 14:23          Risk Assessment/Calculations:             Physical Exam:   VS:  BP 124/72   Pulse (!) 56   Ht 5\' 7"  (1.702  m)   Wt 129 lb 6.4 oz (58.7 kg)   SpO2 99%   BMI 20.27 kg/m    Wt Readings from Last 3 Encounters:  04/21/23 129 lb 6.4 oz (58.7 kg)  04/19/23 132 lb (59.9 kg)  04/08/23 132 lb (59.9 kg)    GEN: Well nourished, well developed in no acute distress NECK: No JVD; No carotid bruits CARDIAC: RRR, no murmurs, rubs, gallops RESPIRATORY:  Clear to auscultation without rales, wheezing or rhonchi  ABDOMEN: Soft, non-tender, non-distended EXTREMITIES:  No edema; No deformity   ASSESSMENT AND PLAN: .    CAD - 2019 CAC 14.6. GDMT includes atorvastatin 40 mg daily. Recent ED visit with episode of heart flutters and discomfort while running associated with dyspnea. Concerning as he had to stop exercise but this is first episode of similar discomfort. More often symptoms have been palpitations, management below. Discussed possible cardiac CTA versus cardiac catheterization for further ischemic evaluation. He wishes to discuss with his wife and PCP prior to deciding how to proceed and will contact us with his decision. Risks/benefits of each procedure reviewed in detail. Of note, if requires stent would have to delay his upcoming urologic  procedure.   HLD, LDL goal less than 70- 03/12/23 total cholesterol 133, triglycerides 53, HDL 56, LDL 65.  Continue atorvastatin 40 mg daily.  Palpitations - Notes prior diagnosis of early beat. PVC by ED monitoring. Plan for 7-14 day ZIO monitor which was placed in clinic. If significant PVC burden may need to consider AAD due to baseline bradycardia.     Informed Consent   Shared Decision Making/Informed Consent The risks [stroke (1 in 1000), death (1 in 1000), kidney failure [usually temporary] (1 in 500), bleeding (1 in 200), allergic reaction [possibly serious] (1 in 200)], benefits (diagnostic support and management of coronary artery disease) and alternatives of a cardiac catheterization were discussed in detail with Mr. Gaddie and he is willing to proceed.      Dispo: follow up in 6-8 weeks with Dr. Cristal Deer  Signed, Alver Sorrow, NP

## 2023-04-21 NOTE — Patient Instructions (Addendum)
Medication Instructions:  Your physician recommends that you continue on your current medications as directed. Please refer to the Current Medication list given to you today.  *If you need a refill on your cardiac medications before your next appointment, please call your pharmacy*   Testing/Procedures: Your physician has recommended that you wear a Zio monitor.   This monitor is a medical device that records the heart's electrical activity. Doctors most often use these monitors to diagnose arrhythmias. Arrhythmias are problems with the speed or rhythm of the heartbeat. The monitor is a small device applied to your chest. You can wear one while you do your normal daily activities. While wearing this monitor if you have any symptoms to push the button and record what you felt. Once you have worn this monitor for the period of time provider prescribed (Usually 14 days), you will return the monitor device in the postage paid box. Once it is returned they will download the data collected and provide [place] with a report which the provider will then review and we will call you with those results. Important tips:  Avoid showering during the first 24 hours of wearing the monitor. Avoid excessive sweating to help maximize wear time. Do not submerge the device, no hot tubs, and no swimming pools. Keep any lotions or oils away from the patch. After 24 hours you may shower with the patch on. Take brief showers with your back facing the shower head.  Do not remove patch once it has been placed because that will interrupt data and decrease adhesive wear time. Push the button when you have any symptoms and write down what you were feeling. Once you have completed wearing your monitor, remove and place into box which has postage paid and place in your outgoing mailbox.  If for some reason you have misplaced your box then call our office and we can provide another box and/or mail it off for you.   Follow-Up: At  [facility], you and your health needs are our priority.  As part of our continuing mission to provide you with exceptional heart care, we have created designated Provider Care Teams.  These Care Teams include your primary Cardiologist (physician) and Advanced Practice Providers (APPs -  Physician Assistants and Nurse Practitioners) who all work together to provide you with the care you need, when you need it.  We recommend signing up for the patient portal called "MyChart".  Sign up information is provided on this After Visit Summary.  MyChart is used to connect with patients for Virtual Visits (Telemedicine).  Patients are able to view lab/test results, encounter notes, upcoming appointments, etc.  Non-urgent messages can be sent to your provider as well.   To learn more about what you can do with MyChart, go to [email or web address].    Your next appointment:   6-8 weeks with Dr. Cristal Deer or Gillian Shields, NP   Other Instructions We have two options to examine your coronary arteries. Either a coronary CTA or a cardiac catheterization. A coronary CTA gets [place] a specific measurement of the plaque in each artery as well as measures the blood flow. It is like getting the same information as a cardiac catheterization but without an invasive procedure. However, if it did show significant blockage you would have to have a cardiac catheterization. A cardiac catheterization is when they put a cathter (wire) into your artery and up into your heart to look at the arteries. If there is a significant blockage, they  would go ahead and place a stent.   Cardiac Catheterization Risks Stroke (1 in 1000), death (1 in 1000), kidney failure [usually temporary] (1 in 500), bleeding (1 in 200), allergic reaction [possibly serious] (1 in 200)]  Cardiac Catheterization Procedure explained You will learn what cardiac catheterization is and how it is performed. You will also learn about additional procedures  that may be performed during cardiac catheterization to treat plaque buildup in your coronary arteries. To view the content, go to this web address: [email or web address]  This video will expire on: 12/04/2024. If you need access to this video following this date, please reach out to the healthcare provider who assigned it to you. This information is not intended to replace advice given to you by your health care provider. Make sure you discuss any questions you have with your health care provider. Elsevier Patient Education  2024 [employer].

## 2023-04-21 NOTE — Telephone Encounter (Addendum)
Called patient, scheduled today at 10:05am.    ----- Message from Alver Sorrow sent at 04/20/2023  3:53 PM EDT ----- Hi Dr. Susann Givens,  Thanks for making [place] aware! Adding our nursing team so we get him scheduled.   Rutherford Guys - can you get him set up for chest pain/palpitations ED f/u visit with myself or another APP (preferably w/in the next 2 weeks).   Thanks,  Alver Sorrow, NP ----- Message ----- From: Ronnald Nian, MD Sent: 04/20/2023  11:00 AM EDT To: Jodelle Red, MD  As you can see he had an ER visit for chest pain.  Disconcerting that he had this while he was running.  I would appreciate if you can get him in to see Dr. Cristal Deer or someone sooner if possible.  Wondering if it is time to get a cath on him. Sharlot Gowda

## 2023-04-22 ENCOUNTER — Telehealth: Payer: Self-pay | Admitting: Cardiology

## 2023-04-22 NOTE — Telephone Encounter (Signed)
Patient called to talk with Gillian Shields or nurse about some tests that he's informing about.

## 2023-04-22 NOTE — Telephone Encounter (Signed)
Returned call to patient,    He states he will do the Cardiac CTA. Orders placed and instructions sent to patient. He is aware he will get a call for scheduling.

## 2023-04-24 ENCOUNTER — Encounter (HOSPITAL_COMMUNITY): Admission: RE | Admit: 2023-04-24 | Payer: Medicare Other | Source: Ambulatory Visit

## 2023-04-25 ENCOUNTER — Telehealth: Payer: Self-pay | Admitting: Cardiology

## 2023-04-25 DIAGNOSIS — R002 Palpitations: Secondary | ICD-10-CM

## 2023-04-25 DIAGNOSIS — I25118 Atherosclerotic heart disease of native coronary artery with other forms of angina pectoris: Secondary | ICD-10-CM

## 2023-04-25 DIAGNOSIS — Z8249 Family history of ischemic heart disease and other diseases of the circulatory system: Secondary | ICD-10-CM

## 2023-04-25 DIAGNOSIS — E785 Hyperlipidemia, unspecified: Secondary | ICD-10-CM

## 2023-04-25 NOTE — Addendum Note (Signed)
Addended by: Regis Bill B on: 04/25/2023 03:10 PM   Modules accepted: Orders

## 2023-04-25 NOTE — Telephone Encounter (Signed)
Patient is requesting call back to discuss test he states he was to have. Please advise.

## 2023-04-25 NOTE — Telephone Encounter (Signed)
Spoke with patient regarding who had several questions 1) Was told at visit he could do short runs. Usually runs 10 miles, has run 2 & 1/2 couple times. Had palpitations when starting run but subsided during jog. Per Luther Parody ok to continue 2) How long should he wear monitor? Office note said 7-14 days. Patient has had couple episodes of palpitations but nothing like when went to ED Advised to wear full 14 days  3) Has not heard from anyone regarding Cardiac CT. Advised patient insurance has to review and PA obtained if required before can even get scheduled but would send follow up since he is scheduled for procedure 7/31

## 2023-04-28 ENCOUNTER — Telehealth: Payer: Self-pay | Admitting: Cardiology

## 2023-04-28 NOTE — Progress Notes (Signed)
COVID Vaccine received:  []  No [x]  Yes Date of any COVID positive Test in last 90 days: No PCP - Sharlot Gowda MD Cardiologist - Jodelle Red MD   Chest x-ray - 04/19/23 Epic EKG - 04/19/23 Epic  Stress Test -  ECHO - 05/05/23  Epic Cardiac Cath -   Bowel Prep - [x]  No  []   Yes ______  Pacemaker / ICD device [x]  No []  Yes   Spinal Cord Stimulator:[x]  No []  Yes       History of Sleep Apnea? [x]  No []  Yes   CPAP used?- [x]  No []  Yes    Does the patient monitor blood sugar?          [x]  No []  Yes  []  N/A  Patient has: [x]  NO Hx DM   []  Pre-DM                 []  DM1  []   DM2 Does patient have a [employer] or Dexacom? []  No []  Yes   Fasting Blood Sugar Ranges-  Checks Blood Sugar _____ times a day  GLP1 agonist / usual dose - No GLP1 instructions:  SGLT-2 inhibitors / usual dose - No SGLT-2 instructions:   Blood Thinner / Instructions:No Aspirin Instructions:No  Comments:   Activity level: Patient is able to climb a flight of stairs without difficulty; [x]  No CP  [x]  No SOB,    Patient can /perform ADLs without assistance.   Anesthesia review: CAD,PVC's, murmur, dyslipidemia  Patient denies shortness of breath, fever, cough and chest pain at PAT appointment.  Patient verbalized understanding and agreement to the Pre-Surgical Instructions that were given to them at this PAT appointment. Patient was also educated of the need to review these PAT instructions again prior to his/her surgery.I reviewed the appropriate phone numbers to call if they have any and questions or concerns.

## 2023-04-28 NOTE — Patient Instructions (Signed)
SURGICAL WAITING ROOM VISITATION  Patients having surgery or a procedure may have no more than 2 support people in the waiting area - these visitors may rotate.    Children under the age of 52 must have an adult with them who is not the patient.  Due to an increase in RSV and influenza rates and associated hospitalizations, children ages 35 and under may not visit patients in [facility] hospitals.  If the patient needs to stay at the hospital during part of their recovery, the visitor guidelines for inpatient rooms apply. Pre-op nurse will coordinate an appropriate time for 1 support person to accompany patient in pre-op.  This support person may not rotate.    Please refer to the [facility] website for the visitor guidelines for Inpatients (after your surgery is over and you are in a regular room).       Your procedure is scheduled on: 05/21/23   Report to [facility] Main Entrance    Report to admitting at 6:45   Call this number if you have problems the morning of surgery [phone]   Do not eat food or drink liquids  :After Midnight.      Oral Hygiene is also important to reduce your risk of infection.                                    Remember - BRUSH YOUR TEETH THE MORNING OF SURGERY WITH YOUR REGULAR TOOTHPASTE   Take these medicines the morning of surgery with A SIP OF WATER:  Tylenol if needed, Atorvastatin, Finasteride, Omeprazole, Tamsulosin             You may not have any metal on your body including hair pins, jewelry, and body piercing             Do not wear make-up, lotions, powders, cologne, or deodorant              Men may shave face and neck.   Do not bring valuables to the hospital. [facility] IS NOT             RESPONSIBLE   FOR VALUABLES.   Contacts, glasses, dentures or bridgework may not be worn into surgery.  DO NOT BRING YOUR HOME MEDICATIONS TO THE HOSPITAL. PHARMACY WILL DISPENSE MEDICATIONS LISTED ON YOUR MEDICATION LIST TO YOU  DURING YOUR ADMISSION IN THE HOSPITAL!    Patients discharged on the day of surgery will not be allowed to drive home.  Someone NEEDS to stay with you for the first 24 hours after anesthesia.   Special Instructions: Bring a copy of your healthcare power of attorney and living will documents the day of surgery if you haven't scanned them before.              Please read over the following fact sheets you were given: IF YOU HAVE QUESTIONS ABOUT YOUR PRE-OP INSTRUCTIONS PLEASE CALL [phone] [facility]y [facility]   If you received a COVID test during your pre-op visit  it is requested that you wear a mask when out in public, stay away from anyone that may not be feeling well and notify your surgeon if you develop symptoms. If you test positive for Covid or have been in contact with anyone that has tested positive in the last 10 days please notify you surgeon.    [facility] - Preparing for Surgery Before surgery, you  can play an important role.  Because skin is not sterile, your skin needs to be as free of germs as possible.  You can reduce the number of germs on your skin by washing with CHG (chlorahexidine gluconate) soap before surgery.  CHG is an antiseptic cleaner which kills germs and bonds with the skin to continue killing germs even after washing. Please DO NOT use if you have an allergy to CHG or antibacterial soaps.  If your skin becomes reddened/irritated stop using the CHG and inform your nurse when you arrive at Short Stay. Do not shave (including legs and underarms) for at least 48 hours prior to the first CHG shower.  You may shave your face/neck.  Please follow these instructions carefully:  1.  Shower with CHG Soap the night before surgery and the  morning of surgery.  2.  If you choose to wash your hair, wash your hair first as usual with your normal  shampoo.  3.  After you shampoo, rinse your hair and body thoroughly to remove the shampoo.                             4.  Use CHG as you would  any other liquid soap.  You can apply chg directly to the skin and wash.  Gently with a scrungie or clean washcloth.  5.  Apply the CHG Soap to your body ONLY FROM THE NECK DOWN.   Do   not use on face/ open                           Wound or open sores. Avoid contact with eyes, ears mouth and   genitals (private parts).                       Wash face,  Genitals (private parts) with your normal soap.             6.  Wash thoroughly, paying special attention to the area where your    surgery  will be performed.  7.  Thoroughly rinse your body with warm water from the neck down.  8.  DO NOT shower/wash with your normal soap after using and rinsing off the CHG Soap.                9.  Pat yourself dry with a clean towel.            10.  Wear clean pajamas.            11.  Place clean sheets on your bed the night of your first shower and do not  sleep with pets. Day of Surgery : Do not apply any lotions/deodorants the morning of surgery.  Please wear clean clothes to the hospital/surgery center.  FAILURE TO FOLLOW THESE INSTRUCTIONS MAY RESULT IN THE CANCELLATION OF YOUR SURGERY  PATIENT SIGNATURE_________________________________  NURSE SIGNATURE__________________________________  ________________________________________________________________________

## 2023-04-28 NOTE — Telephone Encounter (Signed)
Patient is requesting call back to discuss test he has scheduled and his heart monitor. Requesting call back.

## 2023-04-28 NOTE — Telephone Encounter (Signed)
Spoke with patient regarding monitor and Cardiac CT  Patient was told at visit with Luther Parody to wear monitor 7-14 days  No episodes like when he went to ED however has noticed some palpitations at beginning of runs Scheduled for surgical procedure 7/31 and  Cardiac CT scheduled for Friday 7/26 He will try to reschedule the CT for Monday, that will be day 14  If unable to reschedule will keep as scheduled Friday and remove monitor prior to leaving for CT

## 2023-04-29 ENCOUNTER — Telehealth (HOSPITAL_BASED_OUTPATIENT_CLINIC_OR_DEPARTMENT_OTHER): Payer: Self-pay | Admitting: Family

## 2023-04-29 NOTE — Telephone Encounter (Signed)
Returned call to patient, advised him to go ahead and mail it back to the company.

## 2023-04-29 NOTE — Telephone Encounter (Signed)
He's had monitor on for 1 week already so any day he gets the CT scheduled would be fine to go ahead and remove.   Alver Sorrow, NP

## 2023-04-29 NOTE — Telephone Encounter (Signed)
Patient is calling to report his heart monitor that was placed last Monday 07/15 fell off today. He states he was supposed to wear it until this Friday, 07/26. Please advise.

## 2023-04-29 NOTE — Telephone Encounter (Signed)
Returned call to patient and provided provider recommendations!

## 2023-04-30 ENCOUNTER — Encounter (HOSPITAL_COMMUNITY): Payer: Self-pay

## 2023-05-01 ENCOUNTER — Encounter (HOSPITAL_COMMUNITY): Payer: Self-pay

## 2023-05-01 ENCOUNTER — Other Ambulatory Visit: Payer: Self-pay

## 2023-05-01 ENCOUNTER — Ambulatory Visit (INDEPENDENT_AMBULATORY_CARE_PROVIDER_SITE_OTHER): Payer: Medicare Other | Admitting: Otolaryngology

## 2023-05-01 ENCOUNTER — Encounter (HOSPITAL_COMMUNITY)
Admission: RE | Admit: 2023-05-01 | Discharge: 2023-05-01 | Disposition: A | Discharge status: Home / Self Care | Payer: Medicare Other | Source: Ambulatory Visit | Attending: Urology | Admitting: Urology

## 2023-05-01 ENCOUNTER — Encounter (INDEPENDENT_AMBULATORY_CARE_PROVIDER_SITE_OTHER): Payer: Self-pay | Admitting: Otolaryngology

## 2023-05-01 VITALS — BP 144/80 | HR 54 | Ht 67.2 in | Wt 133.2 lb

## 2023-05-01 DIAGNOSIS — Z01812 Encounter for preprocedural laboratory examination: Secondary | ICD-10-CM | POA: Diagnosis not present

## 2023-05-01 DIAGNOSIS — J383 Other diseases of vocal cords: Secondary | ICD-10-CM

## 2023-05-01 DIAGNOSIS — R0982 Postnasal drip: Secondary | ICD-10-CM

## 2023-05-01 DIAGNOSIS — J3089 Other allergic rhinitis: Secondary | ICD-10-CM | POA: Diagnosis not present

## 2023-05-01 DIAGNOSIS — H6123 Impacted cerumen, bilateral: Secondary | ICD-10-CM | POA: Diagnosis not present

## 2023-05-01 DIAGNOSIS — H608X3 Other otitis externa, bilateral: Secondary | ICD-10-CM

## 2023-05-01 DIAGNOSIS — R490 Dysphonia: Secondary | ICD-10-CM | POA: Diagnosis not present

## 2023-05-01 DIAGNOSIS — K219 Gastro-esophageal reflux disease without esophagitis: Secondary | ICD-10-CM | POA: Diagnosis not present

## 2023-05-01 DIAGNOSIS — J342 Deviated nasal septum: Secondary | ICD-10-CM

## 2023-05-01 HISTORY — DX: Cardiac arrhythmia, unspecified: I49.9

## 2023-05-01 MED ORDER — AZELASTINE HCL 0.1 % NA SOLN: 2.0000 | Freq: Two times a day (BID) | NASAL | 12 refills | Status: DC

## 2023-05-01 MED ORDER — FAMOTIDINE 20 MG PO TABS: 20.0000 mg | ORAL_TABLET | Freq: Two times a day (BID) | ORAL | 1 refills | Status: DC

## 2023-05-01 MED ORDER — DESLORATADINE 5 MG PO TABS: 5.0000 mg | ORAL_TABLET | Freq: Every day | ORAL | 3 refills | Status: DC

## 2023-05-01 NOTE — Progress Notes (Signed)
ENT CONSULT:  Reason for Consult: throat discomfort    HPI: Edwin Jimenez is an 72 y.o. male with hx of CAD, GERD, who is here for 1 month of persistent sore throat, with dysphonia, raspy voice. [place]x and it did help but only partially. He had EGD in 80's and had gastritis and esophagitis, had Prilosec then. He started Prilosec a few weeks ago, and it is better. He denies hx of similar in the past. He had dry cough but not currently. He does clear his throat. No dyspnea, no trouble with swallowing. No hx of smoking. He denies nasal congestion or PND. He also feels that his ears are stuffed with ear wax and would like them checked/have cerumen removed.   Records Reviewed:  Seen by Cards 04/21/23 72 y.o. male with history of coronary artery disease, hyperlipidemia.  Family history of CAD.   Echocardiogram 04/2018 normal LVEF 55-60%, grade 1 diastolic dysfunction, aortic sclerosis without stenosis.  Coronary calcium score 08/11/2018 with CAC 14.6 placing him on 29th percentile for age and sex matched control.   Last seen 2022 noting orthostatic lightheadedness, dizziness.  And was encouraged gradual position changes, adequate hydration.    Past Medical History:  Diagnosis Date   Dyslipidemia    Dysrhythmia    ED (erectile dysfunction)    Hyperlipidemia     Past Surgical History:  Procedure Laterality Date   COLONOSCOPY     x2   EXCISION MASS ABDOMINAL Left    angiolipoma   HERNIA REPAIR  06/16/12   [facility]   INGUINAL HERNIA REPAIR  06/16/2012   Procedure: HERNIA REPAIR INGUINAL ADULT;  Surgeon: Valarie Merino, MD;  Location: [facility];  Service: General;  Laterality: Left;  left inguinal hernia repair with mesh    Family History  Problem Relation Age of Onset   Heart disease Father 60   Transient ischemic attack Father    Stroke Mother    Diabetes Sister    Hypertension Neg Hx     Social History:  reports that he has  never smoked. He has never used smokeless tobacco. He reports current alcohol use. He reports that he does not use drugs.  Allergies: No Known Allergies  Medications: I have reviewed the patient's current medications.  The PMH, PSH, Medications, Allergies, and SH were reviewed and updated.  ROS: Constitutional: Negative for fever, weight loss and weight gain. Cardiovascular: Negative for chest pain and dyspnea on exertion. Respiratory: Is not experiencing shortness of breath at rest. Gastrointestinal: Negative for nausea and vomiting. Neurological: Negative for headaches. Psychiatric: The patient is not nervous/anxious  Blood pressure (!) 144/80, pulse (!) 54, height 5' 7.2" (1.707 m), weight 133 lb 3.2 oz (60.4 kg), SpO2 100%.  PHYSICAL EXAM:  Exam: General: Well-developed, well-nourished Communication and Voice: Clear pitch and clarity Respiratory Respiratory effort: Equal inspiration and expiration without stridor Cardiovascular Peripheral Vascular: Warm extremities with equal color/perfusion Eyes: No nystagmus with equal extraocular motion bilaterally Neuro/Psych/Balance: Patient oriented to person, place, and time; Appropriate mood and affect; Gait is intact with no imbalance; Cranial nerves I-XII are intact Head and Face Inspection: Normocephalic and atraumatic without mass or lesion Palpation: Facial skeleton intact without bony stepoffs Salivary Glands: No mass or tenderness Facial Strength: Facial motility symmetric and full bilaterally ENT Pinna: External ear intact and fully developed External canal: Canal is patent with intact skin Tympanic Membrane: Clear and mobile External Nose: No scar  or anatomic deformity Internal Nose: Septum is deviated to the left and with evidence of right sided caudal spur, narrowing both nares, but worse on the left side. No polyp, or purulence. Mucosal edema and erythema present.  Bilateral inferior turbinate hypertrophy.  Lips,  Teeth, and gums: Mucosa and teeth intact and viable TMJ: No pain to palpation with full mobility Oral cavity/oropharynx: No erythema or exudate, no lesions present Nasopharynx: No mass or lesion with intact mucosa Hypopharynx: Intact mucosa without pooling of secretions Larynx Glottic: Full true vocal cord mobility without lesion or mass, but with VF atrophy Supraglottic: Normal appearing epiglottis and AE folds Interarytenoid Space: Moderate pachydermia edema in post-cricoid area Subglottic Space: Patent without lesion or edema Neck Neck and Trachea: Midline trachea without mass or lesion Thyroid: No mass or nodularity Lymphatics: No lymphadenopathy  Procedure:  Preoperative diagnosis:  Postoperative diagnosis:   Same  Procedure: Flexible fiberoptic laryngoscopy  Surgeon: Ashok Croon, MD  Anesthesia: Topical lidocaine and Afrin Complications: None Condition is stable throughout exam  Indications and consent:  The patient presents to the clinic with Indirect laryngoscopy view was incomplete. Thus it was recommended that they undergo a flexible fiberoptic laryngoscopy. All of the risks, benefits, and potential complications were reviewed with the patient preoperatively and verbal informed consent was obtained.  Procedure: The patient was seated upright in the clinic. Topical lidocaine and Afrin were applied to the nasal cavity. After adequate anesthesia had occurred, I then proceeded to pass the flexible telescope into the nasal cavity. The nasal cavity was patent without rhinorrhea or polyp. The nasopharynx was also patent without mass or lesion. The base of tongue was visualized and was normal. There were no signs of pooling of secretions in the piriform sinuses. The true vocal folds were mobile bilaterally. There were no signs of glottic or supraglottic mucosal lesion or mass. There was moderate interarytenoid pachydermia and post cricoid edema. The telescope was then slowly  withdrawn and the patient tolerated the procedure throughout.  Procedure: Cerumen Removal, Bilateral (CPT [id number])  Diagnosis: cerumen impaction, bilateral   Informed consent: Timeout performed and informed consent was obtained.  Procedure: Operating microscope was employed to evaluate the ear(s).  Cerumen curette, speculum and suction were employed to clear the cerumen.   Findings: Normal appearing tympanic membrane on the left without perforations, and external canals are normal after removal of cerumen.No middle ear fluid bilaterally.   Complications: None. Patient tolerated well.   Studies Reviewed:CXR 04/19/23 done for chest pain  FINDINGS: The heart size and mediastinal contours are within normal limits. Normal pulmonary vascularity. No focal consolidation, pleural effusion, or pneumothorax. Unchanged calcified granuloma in the right lower lobe. No acute osseous abnormality.   IMPRESSION: No active disease.  Assessment/Plan: Encounter Diagnoses  Name Primary?   Dysphonia Yes   Gastroesophageal reflux disease without esophagitis    Vocal fold atrophy    Glottic insufficiency    Post-nasal drainage    Environmental and seasonal allergies    Hoarseness [R49.0]    Bilateral impacted cerumen    Chronic eczematous otitis externa of both ears     71 yoM hx of CAD, GERD and URI 1 mo ago, treated with abx, here for persistent sore throat/throat discomfort, dysphonia and frequent throat clearing. Initiated Prilosec and feels somewhat improved but sx are still present. Also had cerumen impaction with muffled hearing, which improved after cerumen removal today. Denies trouble with swallowing, pain with swallowing or breathing.  On exam, including flexible laryngoscopy there was evidence  of nasal congestion PND and moderate post-cricoid edema pachydermia c/w GERD LPR. He had vocal fold atrophy without lesions that appears to be age-appropriate. We discussed exam findings and vocal  hygiene. I offered referral to see SLP, but he would like to hold off at this time. We will initiate management of GERD and PND, and will have him return for a videostrobe in 3 months.   - start Famotidine + Reflux Gourmet  - start Azelastine and Clarinex - diet and lifestyle changes to minimize reflux  - return in 3 months  - Sweet oil for ear wax and dry ears   Thank you for allowing me to participate in the care of this patient. Please do not hesitate to contact me with any questions or concerns.   Ashok Croon, MD [facility] Phone: [phone] Fax: [phone]    05/01/2023, 3:11 PM

## 2023-05-01 NOTE — Patient Instructions (Addendum)
-   start Famotidine + Reflux Gourmet  - start Azelastine and Clarinex - diet and lifestyle changes to minimize reflux  - return in 3 months  - Sweet oil for ear wax and dry ears

## 2023-05-02 ENCOUNTER — Telehealth (HOSPITAL_BASED_OUTPATIENT_CLINIC_OR_DEPARTMENT_OTHER): Payer: Self-pay

## 2023-05-02 ENCOUNTER — Ambulatory Visit (HOSPITAL_COMMUNITY)
Admission: RE | Admit: 2023-05-02 | Discharge: 2023-05-02 | Disposition: A | Discharge status: Home / Self Care | Payer: Medicare Other | Source: Ambulatory Visit | Attending: Family | Admitting: Family

## 2023-05-02 DIAGNOSIS — R002 Palpitations: Secondary | ICD-10-CM | POA: Diagnosis present

## 2023-05-02 DIAGNOSIS — Z8249 Family history of ischemic heart disease and other diseases of the circulatory system: Secondary | ICD-10-CM | POA: Insufficient documentation

## 2023-05-02 DIAGNOSIS — E785 Hyperlipidemia, unspecified: Secondary | ICD-10-CM | POA: Insufficient documentation

## 2023-05-02 DIAGNOSIS — I25118 Atherosclerotic heart disease of native coronary artery with other forms of angina pectoris: Secondary | ICD-10-CM | POA: Insufficient documentation

## 2023-05-02 MED ORDER — NITROGLYCERIN 0.4 MG SL SUBL
SUBLINGUAL_TABLET | SUBLINGUAL | Status: AC
  Filled 2023-05-02: qty 1

## 2023-05-02 MED ORDER — NITROGLYCERIN 0.4 MG SL SUBL
0.8000 mg | SUBLINGUAL_TABLET | Freq: Once | SUBLINGUAL | Status: AC
  Administered 2023-05-02: 0.8 mg via SUBLINGUAL

## 2023-05-02 MED ORDER — ASPIRIN 81 MG PO TBEC: 81.0000 mg | DELAYED_RELEASE_TABLET | Freq: Every day | ORAL | 3 refills | Status: DC

## 2023-05-02 MED ORDER — IOHEXOL 350 MG/ML SOLN
100.0000 mL | Freq: Once | INTRAVENOUS | Status: AC | PRN
  Administered 2023-05-02: 100 mL via INTRAVENOUS

## 2023-05-02 NOTE — Telephone Encounter (Addendum)
Results called to patient who verbalizes understanding! Patient is willing to try ASA 81mg  3 times per week.     ----- Message from Alver Sorrow sent at 05/02/2023  4:47 PM EDT ----- Coronary CTA with calcium score of 12. This indicates mild nonobstructive coronary disease. This is similar to previous study in 2019 - no evidence of progression of coronary disease which is great! Mild plaque is not enough to cause symptoms, but prompts [place] to be sure we are preventing it from worsening. Continue Atorvastatin to prevent progression. Ideally would take Aspirin EC 81mg  daily - previously had bruising when taking but if agreeable could try 3 times per week.   CCing Dr. Susann Givens (Mr. Janice's PCP) just as an FYI.

## 2023-05-04 ENCOUNTER — Encounter: Payer: Self-pay | Admitting: Family Medicine

## 2023-05-06 ENCOUNTER — Encounter (HOSPITAL_BASED_OUTPATIENT_CLINIC_OR_DEPARTMENT_OTHER): Payer: Self-pay

## 2023-05-14 NOTE — Progress Notes (Signed)
COVID Vaccine received:  []  No [x]  Yes Date of any COVID positive Test in last 90 days: No PCP - Sharlot Gowda MD Cardiologist - Jodelle Red MD   Chest x-ray - 04/19/23 Epic EKG - 04/19/23 Epic  Stress Test -  ECHO - 05/05/23  Epic Cardiac Cath -   Bowel Prep - [x]  No  []   Yes ______  Pacemaker / ICD device [x]  No []  Yes   Spinal Cord Stimulator:[x]  No []  Yes       History of Sleep Apnea? [x]  No []  Yes   CPAP used?- [x]  No []  Yes    Does the patient monitor blood sugar?          [x]  No []  Yes  []  N/A  Patient has: [x]  NO Hx DM   []  Pre-DM                 []  DM1  []   DM2 Does patient have a [employer] or Dexacom? []  No []  Yes   Fasting Blood Sugar Ranges-  Checks Blood Sugar _____ times a day  GLP1 agonist / usual dose - No GLP1 instructions:  SGLT-2 inhibitors / usual dose - No SGLT-2 instructions:   Blood Thinner / Instructions:No Aspirin Instructions:No  Comments:   Activity level: Patient is able to climb a flight of stairs without difficulty; [x]  No CP  [x]  No SOB,    Patient can /perform ADLs without assistance.   Anesthesia review: CAD,PVC's, murmur, dyslipidemia, recent cardiac event that when monitored showed short episodes of SVT.  Patient denies shortness of breath, fever, cough and chest pain at PAT appointment.  Patient verbalized understanding and agreement to the Pre-Surgical Instructions that were given to them at this PAT appointment. Patient was also educated of the need to review these PAT instructions again prior to his/her surgery.I reviewed the appropriate phone numbers to call if they have any and questions or concerns.

## 2023-05-14 NOTE — Progress Notes (Signed)
Received  call from pt. He wanted to update Perioperative regarding recent cardiac event. He had "palpitations." His Cardiac CT was "normal" per pt. He did wear external monitor which showed "short episodes of SVT." His Cardiac MD is aware and there will be no new treatment for now.

## 2023-05-20 NOTE — Anesthesia Preprocedure Evaluation (Signed)
Anesthesia Evaluation  Patient identified by MRN, date of birth, ID band Patient awake    Reviewed: Allergy & Precautions, NPO status , Patient's Chart, lab work & pertinent test results  Airway Mallampati: I  TM Distance: >3 FB Neck ROM: Full    Dental  (+) Teeth Intact, Dental Advisory Given   Pulmonary neg pulmonary ROS   breath sounds clear to auscultation       Cardiovascular + dysrhythmias + Valvular Problems/Murmurs  Rhythm:Regular Rate:Normal     Neuro/Psych negative neurological ROS     GI/Hepatic Neg liver ROS,GERD  Medicated,,  Endo/Other  negative endocrine ROS    Renal/GU negative Renal ROS     Musculoskeletal negative musculoskeletal ROS (+)    Abdominal   Peds  Hematology negative hematology ROS (+)   Anesthesia Other Findings   Reproductive/Obstetrics                             Anesthesia Physical Anesthesia Plan  ASA: 3  Anesthesia Plan: General   Post-op Pain Management: Tylenol PO (pre-op)*   Induction: Intravenous  PONV Risk Score and Plan: 3 and Ondansetron, Midazolam and Dexamethasone  Airway Management Planned: LMA  Additional Equipment: None  Intra-op Plan:   Post-operative Plan: Extubation in OR  Informed Consent: I have reviewed the patients History and Physical, chart, labs and discussed the procedure including the risks, benefits and alternatives for the proposed anesthesia with the patient or authorized representative who has indicated his/her understanding and acceptance.     Dental advisory given  Plan Discussed with: CRNA  Anesthesia Plan Comments:        Anesthesia Quick Evaluation

## 2023-05-20 NOTE — Progress Notes (Signed)
Called patient and updated on change of schedule surgery time.   Aware to be at [facility] for surgery at 0830

## 2023-05-21 ENCOUNTER — Encounter (HOSPITAL_COMMUNITY)
Admission: RE | Disposition: A | Discharge status: Home / Self Care | Payer: Self-pay | Source: Home / Self Care | Attending: Urology

## 2023-05-21 ENCOUNTER — Ambulatory Visit (HOSPITAL_COMMUNITY): Payer: Medicare Other | Admitting: Certified Registered Nurse Anesthetist

## 2023-05-21 ENCOUNTER — Ambulatory Visit (HOSPITAL_BASED_OUTPATIENT_CLINIC_OR_DEPARTMENT_OTHER): Payer: Medicare Other | Admitting: Certified Registered Nurse Anesthetist

## 2023-05-21 ENCOUNTER — Ambulatory Visit (HOSPITAL_COMMUNITY)
Admission: RE | Admit: 2023-05-21 | Discharge: 2023-05-21 | Disposition: A | Discharge status: Home / Self Care | Payer: Medicare Other | Attending: Urology | Admitting: Urology

## 2023-05-21 ENCOUNTER — Ambulatory Visit (HOSPITAL_COMMUNITY): Payer: Medicare Other

## 2023-05-21 ENCOUNTER — Encounter (HOSPITAL_COMMUNITY): Payer: Self-pay | Admitting: Urology

## 2023-05-21 DIAGNOSIS — Q632 Ectopic kidney: Secondary | ICD-10-CM | POA: Diagnosis present

## 2023-05-21 DIAGNOSIS — N401 Enlarged prostate with lower urinary tract symptoms: Secondary | ICD-10-CM | POA: Insufficient documentation

## 2023-05-21 DIAGNOSIS — N135 Crossing vessel and stricture of ureter without hydronephrosis: Secondary | ICD-10-CM | POA: Insufficient documentation

## 2023-05-21 DIAGNOSIS — K219 Gastro-esophageal reflux disease without esophagitis: Secondary | ICD-10-CM | POA: Diagnosis not present

## 2023-05-21 DIAGNOSIS — I700 Atherosclerosis of aorta: Secondary | ICD-10-CM | POA: Diagnosis not present

## 2023-05-21 DIAGNOSIS — Z79899 Other long term (current) drug therapy: Secondary | ICD-10-CM | POA: Insufficient documentation

## 2023-05-21 HISTORY — PX: CYSTOSCOPY WITH RETROGRADE PYELOGRAM, URETEROSCOPY AND STENT PLACEMENT: SHX5789

## 2023-05-21 SURGERY — CYSTOURETEROSCOPY, WITH RETROGRADE PYELOGRAM AND STENT INSERTION
Anesthesia: General | Laterality: Right

## 2023-05-21 MED ORDER — OXYCODONE HCL 5 MG PO TABS
ORAL_TABLET | ORAL | Status: AC
  Filled 2023-05-21: qty 1

## 2023-05-21 MED ORDER — OXYCODONE HCL 5 MG PO TABS
5.0000 mg | ORAL_TABLET | Freq: Once | ORAL | Status: AC | PRN
  Administered 2023-05-21: 5 mg via ORAL

## 2023-05-21 MED ORDER — EPHEDRINE SULFATE (PRESSORS) 50 MG/ML IJ SOLN
INTRAMUSCULAR | Status: DC | PRN
  Administered 2023-05-21 (×2): 5 mg via INTRAVENOUS

## 2023-05-21 MED ORDER — AMISULPRIDE (ANTIEMETIC) 5 MG/2ML IV SOLN: 10.0000 mg | Freq: Once | INTRAVENOUS | Status: DC | PRN

## 2023-05-21 MED ORDER — OXYCODONE HCL 5 MG/5ML PO SOLN: 5.0000 mg | Freq: Once | ORAL | Status: AC | PRN

## 2023-05-21 MED ORDER — MIDAZOLAM HCL 2 MG/2ML IJ SOLN
INTRAMUSCULAR | Status: AC
  Filled 2023-05-21: qty 2

## 2023-05-21 MED ORDER — FENTANYL CITRATE PF 50 MCG/ML IJ SOSY: 25.0000 ug | PREFILLED_SYRINGE | INTRAMUSCULAR | Status: DC | PRN

## 2023-05-21 MED ORDER — PROMETHAZINE HCL 25 MG/ML IJ SOLN: 6.2500 mg | INTRAMUSCULAR | Status: DC | PRN

## 2023-05-21 MED ORDER — FENTANYL CITRATE (PF) 100 MCG/2ML IJ SOLN
INTRAMUSCULAR | Status: AC
  Filled 2023-05-21: qty 2

## 2023-05-21 MED ORDER — CEPHALEXIN 500 MG PO CAPS: 500.0000 mg | ORAL_CAPSULE | Freq: Two times a day (BID) | ORAL | 0 refills | Status: AC

## 2023-05-21 MED ORDER — ACETAMINOPHEN 325 MG PO TABS
ORAL_TABLET | ORAL | Status: AC
  Filled 2023-05-21: qty 2

## 2023-05-21 MED ORDER — ONDANSETRON HCL 4 MG/2ML IJ SOLN
INTRAMUSCULAR | Status: DC | PRN
  Administered 2023-05-21: 4 mg via INTRAVENOUS

## 2023-05-21 MED ORDER — PHENAZOPYRIDINE HCL 200 MG PO TABS: 200.0000 mg | ORAL_TABLET | Freq: Three times a day (TID) | ORAL | 0 refills | Status: DC | PRN

## 2023-05-21 MED ORDER — LIDOCAINE HCL (PF) 2 % IJ SOLN
INTRAMUSCULAR | Status: AC
  Filled 2023-05-21: qty 5

## 2023-05-21 MED ORDER — SODIUM CHLORIDE 0.9 % IR SOLN
Status: DC | PRN
  Administered 2023-05-21: 3000 mL

## 2023-05-21 MED ORDER — OXYBUTYNIN CHLORIDE 5 MG PO TABS: 5.0000 mg | ORAL_TABLET | Freq: Three times a day (TID) | ORAL | 1 refills | Status: DC | PRN

## 2023-05-21 MED ORDER — MIDAZOLAM HCL 2 MG/2ML IJ SOLN
INTRAMUSCULAR | Status: DC | PRN
  Administered 2023-05-21: 1 mg via INTRAVENOUS

## 2023-05-21 MED ORDER — IOHEXOL 300 MG/ML  SOLN
INTRAMUSCULAR | Status: DC | PRN
  Administered 2023-05-21: 10 mL

## 2023-05-21 MED ORDER — DEXAMETHASONE SODIUM PHOSPHATE 4 MG/ML IJ SOLN
INTRAMUSCULAR | Status: DC | PRN
  Administered 2023-05-21: 8 mg via INTRAVENOUS

## 2023-05-21 MED ORDER — TRAMADOL HCL 50 MG PO TABS: 50.0000 mg | ORAL_TABLET | Freq: Four times a day (QID) | ORAL | 0 refills | Status: AC | PRN

## 2023-05-21 MED ORDER — LACTATED RINGERS IV SOLN: INTRAVENOUS | Status: DC

## 2023-05-21 MED ORDER — CHLORHEXIDINE GLUCONATE 0.12 % MT SOLN: 15.0000 mL | Freq: Once | OROMUCOSAL | Status: DC

## 2023-05-21 MED ORDER — ONDANSETRON HCL 4 MG/2ML IJ SOLN
INTRAMUSCULAR | Status: AC
  Filled 2023-05-21: qty 2

## 2023-05-21 MED ORDER — CEFAZOLIN SODIUM-DEXTROSE 2-4 GM/100ML-% IV SOLN
2.0000 g | INTRAVENOUS | Status: AC
  Administered 2023-05-21: 2 g via INTRAVENOUS
  Filled 2023-05-21: qty 100

## 2023-05-21 MED ORDER — PROPOFOL 10 MG/ML IV BOLUS
INTRAVENOUS | Status: DC | PRN
Start: 2023-05-21 — End: 2023-05-21
  Administered 2023-05-21: 10 mg via INTRAVENOUS
  Administered 2023-05-21: 200 mg via INTRAVENOUS
  Administered 2023-05-21: 30 mg via INTRAVENOUS

## 2023-05-21 MED ORDER — ACETAMINOPHEN 10 MG/ML IV SOLN: 1000.0000 mg | Freq: Once | INTRAVENOUS | Status: DC | PRN

## 2023-05-21 MED ORDER — DEXAMETHASONE SODIUM PHOSPHATE 10 MG/ML IJ SOLN
INTRAMUSCULAR | Status: AC
  Filled 2023-05-21: qty 1

## 2023-05-21 MED ORDER — FENTANYL CITRATE (PF) 100 MCG/2ML IJ SOLN
INTRAMUSCULAR | Status: DC | PRN
  Administered 2023-05-21 (×2): 25 ug via INTRAVENOUS

## 2023-05-21 MED ORDER — ACETAMINOPHEN 325 MG PO TABS
325.0000 mg | ORAL_TABLET | ORAL | Status: DC | PRN
  Administered 2023-05-21: 650 mg via ORAL

## 2023-05-21 MED ORDER — ACETAMINOPHEN 160 MG/5ML PO SOLN: 325.0000 mg | ORAL | Status: DC | PRN

## 2023-05-21 MED ORDER — ORAL CARE MOUTH RINSE: 15.0000 mL | Freq: Once | OROMUCOSAL | Status: DC

## 2023-05-21 MED ORDER — LIDOCAINE HCL (CARDIAC) PF 100 MG/5ML IV SOSY
PREFILLED_SYRINGE | INTRAVENOUS | Status: DC | PRN
  Administered 2023-05-21: 40 mg via INTRAVENOUS

## 2023-05-21 SURGICAL SUPPLY — 21 items
BAG URO CATCHER STRL LF (MISCELLANEOUS) ×1 IMPLANT
BASKET ZERO TIP NITINOL 2.4FR (BASKET) IMPLANT
BSKT STON RTRVL ZERO TP 2.4FR (BASKET)
CATH URETL OPEN 5X70 (CATHETERS) ×1 IMPLANT
CLOTH BEACON ORANGE TIMEOUT ST (SAFETY) ×1 IMPLANT
EXTRACTOR STONE NITINOL NGAGE (UROLOGICAL SUPPLIES) IMPLANT
FIBER LASER MOSES 200 DFL (Laser) IMPLANT
FIBER LASER MOSES 365 DFL (Laser) IMPLANT
GLOVE SURG LX STRL 7.5 STRW (GLOVE) ×1 IMPLANT
GOWN SRG XL LVL 4 BRTHBL STRL (GOWNS) ×1 IMPLANT
GOWN STRL NON-REIN XL LVL4 (GOWNS) ×1
GUIDEWIRE STR DUAL SENSOR (WIRE) IMPLANT
GUIDEWIRE ZIPWRE .038 STRAIGHT (WIRE) ×1 IMPLANT
KIT TURNOVER KIT A (KITS) IMPLANT
MANIFOLD NEPTUNE II (INSTRUMENTS) ×1 IMPLANT
PACK CYSTO (CUSTOM PROCEDURE TRAY) ×1 IMPLANT
SHEATH NAVIGATOR HD 11/13X36 (SHEATH) IMPLANT
STENT URET 6FRX24 CONTOUR (STENTS) IMPLANT
STENT URET 6FRX26 CONTOUR (STENTS) IMPLANT
TUBING CONNECTING 10 (TUBING) ×1 IMPLANT
TUBING UROLOGY SET (TUBING) ×1 IMPLANT

## 2023-05-21 NOTE — Transfer of Care (Signed)
Immediate Anesthesia Transfer of Care Note  Patient: Moussa Kowalchuk  Procedure(s) Performed: Procedure(s) with comments: CYSTOSCOPY WITH RIGHT  RETROGRADE PYELOGRAM, RIGHT URETEROSCOPY AND RIGHT URETERAL  STENT PLACEMENT (Right) - 45 MINUTES  Patient Location: PACU  Anesthesia Type:General  Level of Consciousness: Patient easily awoken, sedated, comfortable, cooperative, following commands, responds to stimulation.   Airway & Oxygen Therapy: Patient spontaneously breathing, ventilating well, oxygen via simple oxygen mask.  Post-op Assessment: Report given to PACU RN, vital signs reviewed and stable, moving all extremities.   Post vital signs: Reviewed and stable.  Complications: No apparent anesthesia complications   Last Vitals:  Vitals Value Taken Time  BP 114/67 05/21/23 0933  Temp    Pulse 59 05/21/23 0934  Resp 14 05/21/23 0934  SpO2 100 % 05/21/23 0934  Vitals shown include unfiled device data.  Last Pain:  Vitals:   05/21/23 0659  TempSrc: Oral         Complications: No notable events documented.

## 2023-05-21 NOTE — Anesthesia Postprocedure Evaluation (Signed)
Anesthesia Post Note  Patient: Edwin Jimenez  Procedure(s) Performed: CYSTOSCOPY WITH RIGHT  RETROGRADE PYELOGRAM, RIGHT URETEROSCOPY AND RIGHT URETERAL  STENT PLACEMENT (Right)     Patient location during evaluation: PACU Anesthesia Type: General Level of consciousness: awake and alert Pain management: pain level controlled Vital Signs Assessment: post-procedure vital signs reviewed and stable Respiratory status: spontaneous breathing, nonlabored ventilation, respiratory function stable and patient connected to nasal cannula oxygen Cardiovascular status: blood pressure returned to baseline and stable Postop Assessment: no apparent nausea or vomiting Anesthetic complications: no  No notable events documented.  Last Vitals:  Vitals:   05/21/23 1000 05/21/23 1015  BP: 101/64 111/71  Pulse: (!) 50 (!) 50  Resp: 19   Temp:  36.7 C  SpO2: 100% 100%    Last Pain:  Vitals:   05/21/23 1020  TempSrc:   PainSc: 5                  Shelton Silvas

## 2023-05-21 NOTE — H&P (Signed)
Office Visit Report     04/17/2023   --------------------------------------------------------------------------------   Edwin Jimenez  MRN: [id number]  DOB: 06-29-1951, 72 year old Male  PRIMARY CARE:  Ronnald Nian, MD  PRIMARY CARE FAX:  [phone]  REFERRING:  Buzzy Han, PA  PROVIDER:  Karoline Caldwell, M.D.  TREATING:  Rhoderick Moody, M.D.  LOCATION:  [facility] [phone]     --------------------------------------------------------------------------------   CC/HPI: Malrotated kidney   Edwin Jimenez is a 72 year old male, previously followed by Dr. Benancio Deeds, with a history of a malrotated right kidney with UPJ stenosis, BPH with LUTS and chronic testicular pain. Lasix renogram from 2022 showed a differential function of 52% on the left and 48% on the right. T1 half post Lasix was 10.2 minutes on the left and 14.7 minutes on the right. There was mild dilation of the right collecting system, with no evidence of outflow obstruction. Subsequent CTU in 2023 showed no upper tract abnormalities, but he was noted to have an enlarged median lobe. 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 CT from early July showed right sided hydronephrosis and possible UPJ stenosis.   04/17/2023: The patient is here today for routine follow-up. He denies any residual right-sided flank pain, dysuria, hematuria or fever/chills. He continues to struggle with ongoing LUTS and nocturia. Dr. Benancio Deeds had previously tried him on progressively higher doses of desmopressin with little improvement in his nocturia. He was recently seen in the ER for acute right lower quadrant pain. CT from 7/2 showed right sided hydronephrosis with persistently malrotated right kidney and possible UPJO--no ureteral stone burden was identified. His pain as since resolved and his voiding sxs are back to baseline.     ALLERGIES: No Allergies    MEDICATIONS: Finasteride 5 mg tablet 1 tablet PO  Daily  Omeprazole 20 mg capsule,delayed release  Tamsulosin Hcl 0.4 mg capsule 1-2 capsule PO Daily PRN  Betamethasone Dipropionate 0.05 % cream  CoQ-10 100 MG Oral Capsule Oral  Fish Oil 120 mg-180 mg capsule Oral  Lipitor 20 mg tablet Oral  Tadalafil 20 mg tablet     GU PSH: Cystoscopy - 11/14/2022, 11/12/2021 Locm 300-399Mg /Ml Iodine,1Ml - 11/09/2021     NON-GU PSH: Cataract surgery, Left Dental Surgery Procedure Inguinal hernia repair (laparoscopic), Left     GU PMH: Gross hematuria - 04/11/2023, - 11/14/2022, - 12/10/2021, - 11/12/2021, - 11/09/2021, - 11/05/2021 Hydronephrosis (Stable) - 04/11/2023, - 2022 Right testicular pain - 04/11/2023 RLQ pain - 04/11/2023 BPH w/LUTS - 11/14/2022, - 09/26/2022, - 06/21/2022, - 06/13/2022, - 03/13/2022, - 12/10/2021, - 11/12/2021 Nocturia - 11/14/2022, - 09/26/2022, - 06/21/2022, - 06/13/2022, - 03/13/2022, - 12/10/2021, - 2021, - 2021 (Stable), - 2021, - 2018 Urinary Urgency - 11/14/2022, - 11/12/2021, - 2021, - 2021, - 2021, Urinary urgency, - 2014 Microscopic hematuria - 09/26/2022 Nocturnal Enuresis - 06/21/2022, - 06/13/2022 Spermatocele (single) - 2021, - 2021 Pelvic/perineal pain - 2018 Epididymitis, Epididymitis, left - 2016 Left testicular pain, Pain in left testicle - 2016 Interstitial Cystitis (w/o hematuria), Chronic interstitial cystitis without hematuria - 2014      PMH Notes:  2006-09-25 17:15:25 - Note: Serum Enzyme Levels - Alkaline Phosphatase Elevated   NON-GU PMH: Encounter for general adult medical examination without abnormal findings, Encounter for preventive health examination - 2016 Muscle weakness (generalized), Muscle weakness - 2014 Other lack of coordination, Other lack of coordination - 2014 Personal history of other diseases of the digestive system, History of esophageal reflux - 2014 Personal history of  other endocrine, nutritional and metabolic disease, History of hypercholesterolemia - 2014 GERD Hypercholesterolemia    FAMILY HISTORY:  Chronic Interstitial Cystitis - Sister Coronary Artery Disease - Father Crohn's Disease - Sister Death - Father, Mother Family Health Status Number - Runs In Family Stroke Syndrome - Mother, Father   SOCIAL HISTORY: Marital Status: Married Preferred Language: English; Ethnicity: Not Hispanic Or Latino; Race: White Current Smoking Status: Patient has never smoked.   Tobacco Use Assessment Completed: Used Tobacco in last 30 days? Social Drinker.  Does not use drugs. Drinks 2 caffeinated drinks per day. Has not had a blood transfusion. Patient's occupation Government social research officer.     Notes: Never A Smoker, Activities Of Daily Living, Living Independently With Spouse, Self-reliant In Usual Daily Activities, Exercise Habits, Caffeine Use, Death In The Family Father, Tobacco Use, Alcohol Use, Marital History - Currently Married, Occupation:   REVIEW OF SYSTEMS:    GU Review Male:   Patient denies frequent urination, hard to postpone urination, burning/ pain with urination, get up at night to urinate, leakage of urine, stream starts and stops, trouble starting your stream, have to strain to urinate , erection problems, and penile pain.  Gastrointestinal (Upper):   Patient denies nausea, vomiting, and indigestion/ heartburn.  Gastrointestinal (Lower):   Patient denies diarrhea and constipation.  Constitutional:   Patient denies fever, night sweats, weight loss, and fatigue.  Skin:   Patient denies skin rash/ lesion and itching.  Eyes:   Patient denies blurred vision and double vision.  Ears/ Nose/ Throat:   Patient denies sore throat and sinus problems.  Hematologic/Lymphatic:   Patient denies swollen glands and easy bruising.  Cardiovascular:   Patient denies leg swelling and chest pains.  Respiratory:   Patient denies cough and shortness of breath.  Endocrine:   Patient denies excessive thirst.  Musculoskeletal:   Patient denies back pain and joint pain.  Neurological:    Patient denies headaches and dizziness.  Psychologic:   Patient denies depression and anxiety.   VITAL SIGNS: None   Complexity of Data:  X-Ray Review: C.T. Abdomen/Pelvis: Reviewed Films. Reviewed Report. Discussed With Patient.    Notes:                     CLINICAL DATA: Abdominal pain.   EXAM:  CT ABDOMEN AND PELVIS WITH CONTRAST   TECHNIQUE:  Multidetector CT imaging of the abdomen and pelvis was performed  using the standard protocol following bolus administration of  intravenous contrast.   RADIATION DOSE REDUCTION: This exam was performed according to the  departmental dose-optimization program which includes automated  exposure control, adjustment of the mA and/or kV according to  patient size and/or use of iterative reconstruction technique.   CONTRAST: 80mL OMNIPAQUE IOHEXOL 300 MG/ML SOLN   COMPARISON: CT abdomen pelvis dated 11/09/2022. chest CT dated  01/12/2021.   FINDINGS:  Lower chest: Partially visualized 6 mm nodule in the right lower  lobe likely corresponding to the calcified granuloma seen on the  chest CT of 01/12/2021. The visualized lung bases are otherwise  clear.   No intra-abdominal free air or free fluid.   Hepatobiliary: The liver is unremarkable. No biliary dilatation. The  gallbladder is unremarkable.   Pancreas: Unremarkable. No pancreatic ductal dilatation or  surrounding inflammatory changes.   Spleen: Normal in size without focal abnormality.   Adrenals/Urinary Tract: The adrenal glands are unremarkable. There  is mild dilatation of the right kidney. There is extrarenal pelvis  on the right. There is mild right hydronephrosis. No stone  identified. A transition is seen at the ureteropelvic junction.  Underlying urothelial lesion or stricture is not excluded. There is  edema and stranding of the right perinephric fat suspicious for  pyelonephritis. Correlation with urinalysis recommended. Multiple  bilateral renal cysts. No imaging  follow-up. There is no  hydronephrosis on the left. The left ureter and urinary bladder  appear unremarkable.   Stomach/Bowel: There is no bowel obstruction or active inflammation.  The appendix is normal.   Vascular/Lymphatic: Mild aortoiliac atherosclerotic disease. The IVC  is unremarkable. No portal venous gas. There is no adenopathy.   Reproductive: Mildly enlarged prostate gland with median lobe  hypertrophy indenting the base of the bladder.   Other: None   Musculoskeletal: Osteopenia with degenerative changes of the spine.  No acute osseous pathology.   IMPRESSION:  1. Mild right hydronephrosis with a transition at the ureteropelvic  junction. No stone identified. Underlying urothelial lesion or  stricture is not excluded. Correlation with urinalysis recommended  to evaluate for possibility of superimposed pyelonephritis.  2. No bowel obstruction. Normal appendix.  3. Aortic Atherosclerosis (ICD10-I70.0).    Electronically Signed  By: Elgie Collard M.D.  On: 04/08/2023 20:53   PROCEDURES:          Urinalysis w/Scope - 81001 Dipstick Dipstick Cont'd Micro  Color: Yellow Bilirubin: Neg WBC/hpf: 0 - 5/hpf  Appearance: Cloudy Ketones: Neg RBC/hpf: >60/hpf  Specific Gravity: 1.025 Blood: 3+ Bacteria: Few (10-25/hpf)  pH: <=5.0 Protein: Trace Cystals: NS (Not Seen)  Glucose: Neg Urobilinogen: 0.2 Casts: NS (Not Seen)    Nitrites: Neg Trichomonas: Not Present    Leukocyte Esterase: Trace Mucous: Present      Epithelial Cells: NS (Not Seen)      Yeast: NS (Not Seen)      Sperm: Not Present    Notes:      ASSESSMENT:      ICD-10 Details  1 GU:   Hydronephrosis - N13.0 Chronic, Worsening  2   BPH w/LUTS - N40.1 Chronic, Stable  3   Nocturia - R35.1 Chronic, Stable     PLAN:           Orders Labs Urine Culture          Schedule Return Visit/Planned Activity: Next Available Appointment - Schedule Surgery          Document Letter(s):  Created for Ronnald Nian, MD   Created for Patient: Clinical Summary         Notes:    -Plan for diagnostic right ureteroscopy in the coming weeks to rule out any intrinsic source of his hydronephrosis/UPJ obstruction.Marland Kitchen His right renal pelvis has a very aberrant anatomic orientation and surgical reconstruction would be technically challenging. He would likely need to be referred to [facility] should he need a pyeloplasty.  -We briefly discussed the option of TURP, but he would like to defer that until after his ureteroscopy

## 2023-05-21 NOTE — Anesthesia Procedure Notes (Signed)
Procedure Name: LMA Insertion Date/Time: 05/21/2023 8:44 AM  Performed by: Ludwig Lean, CRNAPre-anesthesia Checklist: Patient identified, Emergency Drugs available, Suction available and Patient being monitored Patient Re-evaluated:Patient Re-evaluated prior to induction Oxygen Delivery Method: Circle system utilized Preoxygenation: Pre-oxygenation with 100% oxygen Induction Type: IV induction Ventilation: Mask ventilation without difficulty LMA: LMA inserted LMA Size: 4.0 Number of attempts: 1 Placement Confirmation: positive ETCO2 and breath sounds checked- equal and bilateral Tube secured with: Tape Dental Injury: Teeth and Oropharynx as per pre-operative assessment

## 2023-05-21 NOTE — Op Note (Signed)
Operative Note  Preoperative diagnosis:  1.  Malrotated right kidney  Postoperative diagnosis: 1.  Malrotated right kidney with no evidence of UPJ obstruction 2.  1 to 2 mm right distal ureteral stricture  Procedure(s): 1.  Cystoscopy with diagnostic ureteroscopy and right ureteral stent placement 2.  Right retrograde pyelogram with intraoperative interpretation of fluoroscopic imaging  Surgeon: Rhoderick Moody, MD  Assistants:  None  Anesthesia:  General  Complications:  None  EBL: Less than 5 mL  Specimens: 1.  None  Drains/Catheters: 1.  Right 6 French, 24 cm JJ stent without tether  Intraoperative findings:   Mild, trilobar prostatic urethral obstruction with mild bladder trabeculation.  No other intravesical abnormalities were seen Right retrograde pyelogram revealed a 1 to 2 mm area of narrowing in the distal aspects of the right ureter with mild proximal dilation of the ureter and renal pelvis Anteriorly rotated right renal pelvis/UPJ with no significant UPJ obstruction  Indication:  Edwin Jimenez is a 72 y.o. male with a malrotated right kidney with a recent episode of right sided pyelonephritis.  Recent Lasix renogram showed a mildly dilated right kidney with T1 half of 14 minutes on the right and a differential function of 47% on the right.  He has been consented today for the above procedures, voices understanding and wishes to proceed.  Description of procedure:  After informed consent was obtained, the patient was brought to the operating room and general LMA anesthesia was administered. The patient was then placed in the dorsolithotomy position and prepped and draped in the usual sterile fashion. A timeout was performed. A 23 French rigid cystoscope was then inserted into the urethral meatus and advanced into the bladder under direct vision. A complete bladder survey revealed no intravesical pathology.  A 5 French ureteral catheter was then inserted  into the right ureteral orifice and a retrograde pyelogram was obtained, with the findings listed above.  A Glidewire was then used to intubate the lumen of the ureteral catheter and was advanced up to the right renal pelvis, under fluoroscopic guidance.  The catheter was then removed, leaving the wire in place.  A flexible ureteroscope was then advanced over the wire and a thin ureteral stricture was encountered in the distal ureter but was easily bypassed with the scope.  The flexible ureteroscope was then advanced up to the UPJ where no significant luminal narrowing was identified.  The right renal pelvis exhibited no mucosal abnormalities or signs of any stone burden.  The flexible ureteroscope was then removed, leaving the wire in place.  A 6 French, 24 cm JJ stent was then placed over the wire and into good position within the right collecting system, confirming placement via fluoroscopy.  The patient's bladder was drained.  He tolerated the procedure well and was transferred to the postanesthesia in stable condition.  Plan: Follow-up in 1 week for office cystoscopy and stent removal

## 2023-05-22 ENCOUNTER — Encounter (HOSPITAL_COMMUNITY): Payer: Self-pay | Admitting: Urology

## 2023-06-02 ENCOUNTER — Encounter (HOSPITAL_BASED_OUTPATIENT_CLINIC_OR_DEPARTMENT_OTHER): Payer: Self-pay | Admitting: Family

## 2023-06-02 ENCOUNTER — Ambulatory Visit (HOSPITAL_BASED_OUTPATIENT_CLINIC_OR_DEPARTMENT_OTHER): Payer: Medicare Other | Admitting: Family

## 2023-06-02 VITALS — BP 110/60 | HR 68 | Ht 67.0 in | Wt 130.0 lb

## 2023-06-02 DIAGNOSIS — E785 Hyperlipidemia, unspecified: Secondary | ICD-10-CM

## 2023-06-02 DIAGNOSIS — I25118 Atherosclerotic heart disease of native coronary artery with other forms of angina pectoris: Secondary | ICD-10-CM | POA: Diagnosis not present

## 2023-06-02 DIAGNOSIS — I471 Supraventricular tachycardia, unspecified: Secondary | ICD-10-CM | POA: Diagnosis not present

## 2023-06-02 NOTE — Progress Notes (Signed)
Cardiology Office Note:  .   Date:  06/02/2023  ID:  Sulaiman Fintel, DOB 1950/10/18, MRN [id number] PCP: Ronnald Nian, MD  [facility] Providers Cardiologist:  Jodelle Red, MD    History of Present Illness: .   Law Zara is a 72 y.o. male  with history of coronary artery disease, hyperlipidemia.  Family history of CAD.   Echocardiogram 04/2018 normal LVEF 55-60%, grade 1 diastolic dysfunction, aortic sclerosis without stenosis.  Coronary calcium score 08/11/2018 with CAC 14.6 placing him on 29th percentile for age and sex matched control.   Last seen 2022 noting orthostatic lightheadedness, dizziness.  And was encouraged gradual position changes, adequate hydration.   ED visit 04/19/23 with chest pain, shortness of breath, palpitations. Palpitations started 3-4 miles into his run. HS troponin x2 unremarkable. EKG with NSR 66 bpm with upsloping T wave in V4-6. Telemetry with occasional PVC.    Seen 04/21/23 for ED follow up. Subsequent cardiac CTA 05/02/23 with nonobstructive CAD calcium score of 12 (prox RCA 24-49% stenosed, pLAD 0-24%, mLAD 24-49%). Monitor with predominantly NSR, brief episodes of SVT. Triggered episodes associated with NSR or SVT.    He presents today for follow up. 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 He has been a runner for 50 years and is hopeful to return to more regular running after recent cytoscopy. Plans to increase hydration prior to runs as often experiences palpitations at initial portion of his run. Reports no shortness of breath nor dyspnea on exertion. Reports no chest pain, pressure, or tightness. No edema, orthopnea, PND.   ROS: Please see the history of present illness.    All other systems reviewed and are negative.   Studies Reviewed: .        Cardiac Studies & Procedures       ECHOCARDIOGRAM  ECHOCARDIOGRAM COMPLETE 05/04/2018  Narrative *[facility]* [address] [place], [place]  [address] [phone]  ------------------------------------------------------------------- Transthoracic Echocardiography  Patient:    Cristopher, Bjornson MR #:       [id number] Study Date: 05/04/2018 Gender:     M Age:        10 Height:     171.5 cm Weight:     60.8 kg BSA:        1.7 m^2 Pt. Status: Room:  SONOGRAPHER  [place], Will ATTENDING    Zoila Shutter MD PERFORMING   [facility] [phone] Janae Sauce [phone]  cc:  ------------------------------------------------------------------- LV EF: 55% -   60%  ------------------------------------------------------------------- Indications:      (R01.1).  ------------------------------------------------------------------- History:   PMH:  Acquired from the patient and from the patient&'s chart.  Murmur.  Risk factors:  Hypertension. Dyslipidemia.  ------------------------------------------------------------------- Study Conclusions  - Left ventricle: The cavity size was normal. Wall thickness was normal. Systolic function was normal. The estimated ejection fraction was in the range of 55% to 60%. Wall motion was normal; there were no regional wall motion abnormalities. Doppler parameters are consistent with abnormal left ventricular relaxation (grade 1 diastolic dysfunction).  Impressions:  - Normal LV systolic function; mild diastolic dysfunction; sclerotic aortic valve.  ------------------------------------------------------------------- Study data:  No prior study was available for comparison.  Study status:  Routine.  Procedure:  The patient reported no pain pre or post test. Transthoracic echocardiography for left ventricular function evaluation and for assessment of valvular function. Image quality was adequate.  Study completion:  There were  no complications.          Transthoracic echocardiography.  M-mode, complete 2D, spectral Doppler, and color Doppler.   Birthdate: Patient birthdate: 06-01-1951.  Age:  Patient is 72 yr old.  Sex: Gender: male.    BMI: 20.7 kg/m^2.  Blood pressure:     124/70 Patient status:  Outpatient.  Study date:  Study date: 05/04/2018. Study time: 02:23 PM.  Location:  [facility]  -------------------------------------------------------------------  ------------------------------------------------------------------- Left ventricle:  The cavity size was normal. Wall thickness was normal. Systolic function was normal. The estimated ejection fraction was in the range of 55% to 60%. Wall motion was normal; there were no regional wall motion abnormalities. Doppler parameters are consistent with abnormal left ventricular relaxation (grade 1 diastolic dysfunction).  ------------------------------------------------------------------- Aortic valve:   Trileaflet; mildly thickened leaflets. Mobility was not restricted.  Doppler:  Transvalvular velocity was within the normal range. There was no stenosis. There was no regurgitation.  ------------------------------------------------------------------- Aorta:  Aortic root: The aortic root was normal in size.  ------------------------------------------------------------------- Mitral valve:   Structurally normal valve.   Mobility was not restricted.  Doppler:  Transvalvular velocity was within the normal range. There was no evidence for stenosis. There was trivial regurgitation.    Peak gradient (D): 3 mm Hg.  ------------------------------------------------------------------- Left atrium:  The atrium was normal in size.  ------------------------------------------------------------------- Right ventricle:  The cavity size was normal. Systolic function was normal.  ------------------------------------------------------------------- Pulmonic valve:    Doppler:  Transvalvular velocity was within the normal range. There was no evidence for stenosis. There was  trivial regurgitation.  ------------------------------------------------------------------- Tricuspid valve:   Structurally normal valve.    Doppler: Transvalvular velocity was within the normal range. There was trivial regurgitation.  ------------------------------------------------------------------- Pulmonary artery:   Systolic pressure was at the upper limits of normal.  ------------------------------------------------------------------- Right atrium:  The atrium was normal in size.  ------------------------------------------------------------------- Pericardium:  There was no pericardial effusion.  ------------------------------------------------------------------- Systemic veins: Inferior vena cava: The vessel was normal in size.  ------------------------------------------------------------------- Measurements  Left ventricle                           Value        Reference LV ID, ED, PLAX chordal          (L)     41    mm     43 - 52 LV ID, ES, PLAX chordal                  26.8  mm     23 - 38 LV fx shortening, PLAX chordal           35    %      >=29 LV PW thickness, ED                      10.68 mm     --------- IVS/LV PW ratio, ED                      0.95         <=1.3 Stroke volume, 2D                        72    ml     --------- Stroke volume/bsa, 2D  42    ml/m^2 --------- LV ejection fraction, 1-p A4C            72    %      --------- LV end-diastolic volume, 2-p             97    ml     --------- LV end-systolic volume, 2-p              29    ml     --------- LV ejection fraction, 2-p                70    %      --------- Stroke volume, 2-p                       68    ml     --------- LV end-diastolic volume/bsa, 2-p         57    ml/m^2 --------- LV end-systolic volume/bsa, 2-p          17    ml/m^2 --------- Stroke volume/bsa, 2-p                   40    ml/m^2 --------- LV e&', lateral                           11.6  cm/s   --------- LV  E/e&', lateral                         6.85         --------- LV e&', medial                            8.48  cm/s   --------- LV E/e&', medial                          9.38         --------- LV e&', average                           10.04 cm/s   --------- LV E/e&', average                         7.92         ---------  Ventricular septum                       Value        Reference IVS thickness, ED                        10.14 mm     ---------  LVOT                                     Value        Reference LVOT ID, S                               21    mm     --------- LVOT area  3.46  cm^2   --------- LVOT ID                                  21    mm     --------- LVOT peak velocity, S                    100   cm/s   --------- LVOT mean velocity, S                    63.8  cm/s   --------- LVOT VTI, S                              20.9  cm     --------- LVOT peak gradient, S                    4     mm Hg  --------- Stroke volume (SV), LVOT DP              72.4  ml     --------- Stroke index (SV/bsa), LVOT DP           42.6  ml/m^2 ---------  Aorta                                    Value        Reference Aortic root ID, ED                       31    mm     --------- Ascending aorta ID, A-P, S               31    mm     ---------  Left atrium                              Value        Reference LA ID, A-P, ES                           38    mm     --------- LA ID/bsa, A-P                   (H)     2.24  cm/m^2 <=2.2 LA volume, S                             54    ml     --------- LA volume/bsa, S                         31.8  ml/m^2 --------- LA volume, ES, 1-p A4C                   47    ml     --------- LA volume/bsa, ES, 1-p A4C               27.7  ml/m^2 --------- LA volume, ES, 1-p A2C  61    ml     --------- LA volume/bsa, ES, 1-p A2C               35.9  ml/m^2 ---------  Mitral valve                             Value         Reference Mitral E-wave peak velocity              79.5  cm/s   --------- Mitral A-wave peak velocity              86.4  cm/s   --------- Mitral deceleration time                 190   ms     150 - 230 Mitral peak gradient, D                  3     mm Hg  --------- Mitral E/A ratio, peak                   0.9          ---------  Pulmonary arteries                       Value        Reference PA pressure, S, DP                       30    mm Hg  <=30  Tricuspid valve                          Value        Reference Tricuspid regurg peak velocity           258   cm/s   --------- Tricuspid peak RV-RA gradient            27    mm Hg  ---------  Systemic veins                           Value        Reference Estimated CVP                            3     mm Hg  ---------  Right ventricle                          Value        Reference RV pressure, S, DP                       30    mm Hg  <=30 RV s&', lateral, S                        13.3  cm/s   ---------  Legend: (L)  and  (H)  mark values outside specified reference range.  ------------------------------------------------------------------- Prepared and Electronically Authenticated by  Olga Millers 2019-07-29T15:16:29     CT SCANS  CT CORONARY MORPH W/CTA COR W/SCORE 05/02/2023  Addendum 05/10/2023  9:12 AM ADDENDUM REPORT: 05/10/2023 09:10  EXAM: OVER-READ INTERPRETATION  CT CHEST  The  following report is an over-read performed by radiologist Dr. Curly Shores [facility] on 05/10/2023. This over-read does not include interpretation of cardiac or coronary anatomy or pathology. The coronary CTA interpretation by the cardiologist is attached.  COMPARISON:  01/12/2021.  FINDINGS: Cardiovascular:  Findings discussed in the body of the report.  Mediastinum/Nodes: No suspicious adenopathy identified. Imaged mediastinal structures are unremarkable.  Lungs/Pleura: Calcified nodule in the right lower lobe is  unchanged. 3 mm pleural-based right lower lobe nodule also stable. No pneumonia or pulmonary edema. No pleural effusion or pneumothorax.  Upper Abdomen: No acute abnormality.  Musculoskeletal: Mild bilateral gynecomastia. No acute or significant osseous findings.  IMPRESSION: Bilateral gynecomastia. Otherwise no acute extracardiac incidental findings.   Electronically Signed By: Layla Maw M.D. On: 05/10/2023 09:10  Narrative CLINICAL DATA:  18F with chest pain  EXAM: Cardiac/Coronary CTA  TECHNIQUE: The patient was scanned on a [employer].  FINDINGS: A 100 kV prospective scan was triggered in the descending thoracic aorta at 111 HU's. Axial non-contrast 3 mm slices were carried out through the heart. The data set was analyzed on a dedicated work station and scored using the Agatson method. Gantry rotation speed was 250 msecs and collimation was .6 mm. No beta blockade and 0.8 mg of sl NTG was given. The 3D data set was reconstructed in 5% intervals of the 35-75% of the R-R cycle. Phases were analyzed on a dedicated work station using MPR, MIP and VRT modes. The patient received 100 cc of contrast.  Coronary Arteries:  Normal coronary origin.  Right dominance.  RCA is a large dominant artery that gives rise to PDA and PLA. Noncalcified plaque in proximal RCA causes 24-49% stenosis  Left main is a large artery that gives rise to LAD and LCX arteries.  LAD is a large vessel. Calcified plaque in proximal LAD causes 0-24% stenosis. Mixed plaque in mid LAD causes 24-49% stenosis  LCX is a non-dominant artery that gives rise to one large OM1 branch. There is no plaque.  Other findings:  Left Ventricle: Normal size  Left Atrium: Normal size  Pulmonary Veins: Normal configuration  Right Ventricle: Mild enlargement  Right Atrium: Mild enlargement  Cardiac valves: No calcifications  Thoracic aorta: Normal size  Pulmonary Arteries: Normal  size  Systemic Veins: Normal drainage  Pericardium: Normal thickness  IMPRESSION: 1. Coronary calcium score of 12. This was 19th percentile for age and sex matched control.  2. Total plaque volume 18mm3 which is 7th percentile for age and sex-matched controls (calcified plaque 76mm3; noncalcified plaque 54mm3). TPV is mild  3. Step artifacts due to respiratory motion during image acquisition affects interpretation  4.  Normal coronary origin with right dominance.  5.  Nonobstructive CAD  6. Noncalcified plaque in proximal RCA causes mild (24-49%) stenosis  7. Calcified plaque in proximal LAD causes minimal (0-24%) stenosis. Mixed plaque in mid LAD causes mild (24-49%) stenosis  CAD-RADS 2. Mild non-obstructive CAD (25-49%). Consider non-atherosclerotic causes of chest pain. Consider preventive therapy and risk factor modification.  Electronically Signed: By: Epifanio Lesches M.D. On: 05/02/2023 11:03   CT SCANS  CT CARDIAC SCORING (SELF PAY ONLY) 08/11/2018  Addendum 08/11/2018  3:19 PM ADDENDUM REPORT: 08/11/2018 15:16  EXAM: OVER-READ INTERPRETATION  CT CHEST  The following report is an over-read performed by radiologist Dr. Marinda Elk [facility] on 08/11/2018. This over-read does not include interpretation of cardiac or coronary anatomy or pathology. The coronary calcium score interpretation by the  cardiologist is attached.  COMPARISON:  Chest x-ray on 11/10/2016  FINDINGS: Mediastinum/Nodes: No lymphadenopathy identified. No masses in the visualized mediastinum or hilar regions.  Lungs/Pleura: There is a 10 mm well-circumscribed and partially calcified nodule in the posterior right lower lobe. This was seen on prior chest x-ray. This nodule is mostly calcified and appears to be consistent with either a granuloma or hamartoma. Visualized lungs show no evidence of pulmonary edema, consolidation, pneumothorax or pleural  fluid.  Chest Wall: No abnormalities identified.  Upper Abdomen: No abnormalities identified.  Musculoskeletal: Visualized bony structures are unremarkable.  IMPRESSION: 10 mm well-circumscribed and mostly calcified nodule in the posterior right lower lobe. This is felt to most likely represent a benign calcified granuloma or hamartoma. This was visible by prior chest x-ray in 2018. No additional imaging is available to confirm stability beyond that time. Although felt to most likely be benign, it would be reasonable to follow the nodule with at least one CT of the chest to include the entire chest as the lungs were not entirely imaged today. A follow-up CT in approximately 6-12 months would be reasonable for this likely benign abnormality.   Electronically Signed By: Irish Lack M.D. On: 08/11/2018 15:16  Narrative CLINICAL DATA:  Risk stratification  EXAM: Coronary Calcium Score  MEDICATIONS: None.  TECHNIQUE: The patient was scanned on a [employer]. Axial non-contrast 3 mm slices were carried out through the heart. The data set was analyzed on a dedicated work station and scored using the Agatson method.  FINDINGS: Non-cardiac: See separate report from [facility].  Ascending Aorta: Normal size, minimal calcifications in the aortic root.  Pericardium: Normal.  Coronary arteries: Normal origin.  IMPRESSION: Coronary calcium score of 14.6. This was 29 percentile for age and sex matched control.  Electronically Signed: By: Tobias Alexander On: 08/11/2018 14:23          Risk Assessment/Calculations:             Physical Exam:   VS:  BP 110/60   Pulse 68   Ht 5\' 7"  (1.702 m)   Wt 130 lb (59 kg)   BMI 20.36 kg/m    Wt Readings from Last 3 Encounters:  06/02/23 130 lb (59 kg)  05/01/23 130 lb (59 kg)  05/01/23 133 lb 3.2 oz (60.4 kg)    GEN: Well nourished, well developed in no acute distress NECK: No JVD; No carotid  bruits CARDIAC: RRR, no murmurs, rubs, gallops RESPIRATORY:  Clear to auscultation without rales, wheezing or rhonchi  ABDOMEN: Soft, non-tender, non-distended EXTREMITIES:  No edema; No deformity   ASSESSMENT AND PLAN: .    CAD - 2019 CAC 14.6. 05/2023 calcium score 12 with mild nonobstructive disease. GDMT includes atorvastatin 40 mg daily. No BB due to baseline bradycardia. Recommend aiming for 150 minutes of moderate intensity activity per week and following a heart healthy diet.     HLD, LDL goal less than 70- 03/12/23 total cholesterol 133, triglycerides 53, HDL 56, LDL 65.  Continue atorvastatin 40 mg daily.    Palpitations / SVT - Monitor with brief episodes of SVT. Otherwise sinus rhythm with average heart rate of 57 bpm. As such, defer beta blocker due to baseline bradycardia. Encouraged to stay well hydrated, limit caffeine, manage stress well. If worsening palpitations, consider referral to EP for discussion of AAD.         Dispo: follow up in 1 year  Signed, Alver Sorrow, NP

## 2023-06-02 NOTE — Patient Instructions (Signed)
Medication Instructions:  Continue your current medications.   *If you need a refill on your cardiac medications before your next appointment, please call your pharmacy*  Testing/Procedures: Your monitor showed short episodes of SVT which is not of concern.   Follow-Up: At [facility], you and your health needs are our priority.  As part of our continuing mission to provide you with exceptional heart care, we have created designated Provider Care Teams.  These Care Teams include your primary Cardiologist (physician) and Advanced Practice Providers (APPs -  Physician Assistants and Nurse Practitioners) who all work together to provide you with the care you need, when you need it.  We recommend signing up for the patient portal called "MyChart".  Sign up information is provided on this After Visit Summary.  MyChart is used to connect with patients for Virtual Visits (Telemedicine).  Patients are able to view lab/test results, encounter notes, upcoming appointments, etc.  Non-urgent messages can be sent to your provider as well.   To learn more about what you can do with MyChart, go to [email or web address].    Your next appointment:   1 year(s)  Provider:   Jodelle Red, MD or Gillian Shields, NP    Other Instructions  To prevent palpitations: Make sure you are adequately hydrated.  Avoid and/or limit caffeine containing beverages like soda or tea. Exercise regularly.  Manage stress well. Some over the counter medications can cause palpitations such as Benadryl, AdvilPM, TylenolPM. Regular Advil or Tylenol do not cause palpitations.    Supraventricular Tachycardia, Adult Supraventricular tachycardia (SVT) is a kind of abnormal heartbeat. It makes your heart beat very fast. This may last for a short time and then return to normal, or it may last longer. A normal resting heartbeat is 60-100 times a minute. This condition can make your heart beat more than 150 times a  minute. Times of having a fast heartbeat (episodes) can be scary, but they are usually not dangerous. In some cases, they may lead to heart failure if they: Happen many times a day. Last longer than a few seconds. What are the causes?  This condition happens when electrical signals are sent out from areas of the heart that do not normally send signals for the heartbeat. What increases the risk? You are more likely to develop this condition if you are: Middle aged or younger. Male. The following factors may also make you more likely to develop this condition: Stress. Feeling worried or nervous (anxiety). Tiredness. Smoking. Stimulant drugs, such as cocaine and methamphetamine. Alcohol. Caffeine. Pregnancy. Having certain medical conditions. What are the signs or symptoms? A pounding heart. A feeling that your heart is skipping beats (palpitations). Weakness. Trouble getting enough air. Pain or tightness in your chest. Dizziness or feeling like you are going to pass out (faint). Feeling worried or nervous. Sweating. Feeling like you may vomit (nausea). Passing out. Tiredness. Sometimes, there are no symptoms. How is this treated? Treatment may include: Vagal nerve stimulation. Ways to do this include: Holding your breath and pushing, as though you are pooping (having a bowel movement). Massaging an area on one side of your neck. Do not try this yourself. Only a doctor should do this. If done the wrong way, it can lead to a stroke. Bending forward with your head between your legs. Coughing while bending forward with your head between your legs. Putting an ice-cold, wet towel on your face. Medicines that prevent attacks. Medicine to stop an attack given through an  IV tube at the hospital. A small electric shock (cardioversion) that stops an attack. A procedure to get rid of cells in the area that is causing the fast heartbeats (radiofrequency ablation). If you do not have  symptoms, you may not need treatment. Follow these instructions at home: Stress Avoid things that make you feel stressed. To deal with stress, try: Doing yoga or meditation. Being out in nature. Listening to relaxing music. Doing deep breathing. Taking steps to be healthy, such as getting lots of sleep, exercising, and eating a balanced diet. Talking with a mental health doctor. Lifestyle Try to get at least 7 hours of sleep each night. Do not smoke or use any products that contain nicotine or tobacco. If you need help quitting, ask your doctor. Do not drink alcohol if it gives you a fast heartbeat. If alcohol does not seem to give you a fast heartbeat, limit your alcohol use. If you drink alcohol: Limit how much you have to: 0-1 drink a day for women who are not pregnant. 0-2 drinks a day for men. Know how much alcohol is in your drink. In the [place] one drink equals one 12 oz bottle of beer (355 mL), one 5 oz glass of wine (148 mL), or one 1 oz glass of hard liquor (44 mL). Be aware of how caffeine affects you. If caffeine gives you a fast heartbeat, do not eat, drink, or use anything with caffeine in it. If caffeine does not seem to give you a fast heartbeat, limit how much caffeine you eat, drink, or use. Do not use stimulant drugs. If you need help quitting, ask your doctor. General instructions Stay at a healthy weight. Exercise regularly. Ask your doctor about good activities for you. Try one or a mixture of these: 150 minutes a week of gentle exercise, like walking or yoga. 75 minutes a week of exercise that is very active, like running or swimming. Do vagus nerve treatments to slow down your heartbeat as told by your doctor. Take over-the-counter and prescription medicines only as told by your doctor. Keep all follow-up visits. Contact a doctor if: You have a fast heartbeat more often. Times of having a fast heartbeat last longer than before. Home treatments to slow down  your heartbeat do not help. You have new symptoms. Get help right away if: You have chest pain. Your symptoms get worse. You have trouble breathing. Your heart beats very fast for more than 20 minutes. You pass out. These symptoms may be an emergency. Get medical help right away. Call your local emergency services (911 in the [place] Do not wait to see if the symptoms will go away. Do not drive yourself to the hospital. Summary SVT is a type of abnormal heartbeat. This condition can make your heart beat more than 150 times a minute. If you do not have symptoms, you may not need treatment. This information is not intended to replace advice given to you by your health care provider. Make sure you discuss any questions you have with your health care provider. Document Revised: 05/05/2020 Document Reviewed: 05/06/2020 Elsevier Patient Education  2024 [employer].

## 2023-07-09 ENCOUNTER — Encounter (INDEPENDENT_AMBULATORY_CARE_PROVIDER_SITE_OTHER): Payer: Self-pay | Admitting: Otolaryngology

## 2023-07-09 ENCOUNTER — Ambulatory Visit (INDEPENDENT_AMBULATORY_CARE_PROVIDER_SITE_OTHER): Payer: Medicare Other | Admitting: Otolaryngology

## 2023-07-09 VITALS — BP 121/73

## 2023-07-09 DIAGNOSIS — J342 Deviated nasal septum: Secondary | ICD-10-CM

## 2023-07-09 DIAGNOSIS — J3089 Other allergic rhinitis: Secondary | ICD-10-CM | POA: Diagnosis not present

## 2023-07-09 DIAGNOSIS — J383 Other diseases of vocal cords: Secondary | ICD-10-CM

## 2023-07-09 DIAGNOSIS — R0982 Postnasal drip: Secondary | ICD-10-CM

## 2023-07-09 DIAGNOSIS — R490 Dysphonia: Secondary | ICD-10-CM | POA: Diagnosis not present

## 2023-07-09 DIAGNOSIS — K219 Gastro-esophageal reflux disease without esophagitis: Secondary | ICD-10-CM

## 2023-07-09 DIAGNOSIS — R0981 Nasal congestion: Secondary | ICD-10-CM

## 2023-07-09 NOTE — Progress Notes (Signed)
ENT Progress Note:  Update 07/09/23: He has been doing Reflux Gourmet. He is on Azelastine and Clarinex.  Throat discomfort/sore throat improved but he continues to have intermittent hoarseness. Here for repeat evaluation. Please see summary of his initial presentation below which I reviewed.   Initial Consult   Reason for Consult: throat discomfort    HPI: Edwin Jimenez is an 72 y.o. male with hx of CAD, GERD, who is here for 1 month of persistent sore throat, with dysphonia, raspy voice. [place]x and it did help but only partially. He had EGD in 80's and had gastritis and esophagitis, had Prilosec then. He started Prilosec a few weeks ago, and it is better. He denies hx of similar in the past. He had dry cough but not currently. He does clear his throat. No dyspnea, no trouble with swallowing. No hx of smoking. He denies nasal congestion or PND. He also feels that his ears are stuffed with ear wax and would like them checked/have cerumen removed.   Records Reviewed:  Seen by Cards 04/21/23 72 y.o. male with history of coronary artery disease, hyperlipidemia.  Family history of CAD.   Echocardiogram 04/2018 normal LVEF 55-60%, grade 1 diastolic dysfunction, aortic sclerosis without stenosis.  Coronary calcium score 08/11/2018 with CAC 14.6 placing him on 29th percentile for age and sex matched control.   Last seen 2022 noting orthostatic lightheadedness, dizziness.  And was encouraged gradual position changes, adequate hydration.    Past Medical History:  Diagnosis Date   Dyslipidemia    Dysrhythmia    ED (erectile dysfunction)    Hyperlipidemia     Past Surgical History:  Procedure Laterality Date   COLONOSCOPY     x2   CYSTOSCOPY WITH RETROGRADE PYELOGRAM, URETEROSCOPY AND STENT PLACEMENT Right 05/21/2023   Procedure: CYSTOSCOPY WITH RIGHT  RETROGRADE PYELOGRAM, RIGHT URETEROSCOPY AND RIGHT URETERAL  STENT PLACEMENT;  Surgeon: Rene Paci, MD;  Location: [facility];  Service: Urology;  Laterality: Right;  45 MINUTES   EXCISION MASS ABDOMINAL Left    angiolipoma   HERNIA REPAIR  06/16/12   [facility]   INGUINAL HERNIA REPAIR  06/16/2012   Procedure: HERNIA REPAIR INGUINAL ADULT;  Surgeon: Valarie Merino, MD;  Location: [facility];  Service: General;  Laterality: Left;  left inguinal hernia repair with mesh    Family History  Problem Relation Age of Onset   Heart disease Father 82   Transient ischemic attack Father    Stroke Mother    Diabetes Sister    Hypertension Neg Hx     Social History:  reports that he has never smoked. He has never used smokeless tobacco. He reports current alcohol use. He reports that he does not use drugs.  Allergies: No Known Allergies  Medications: I have reviewed the patient's current medications.  The PMH, PSH, Medications, Allergies, and SH were reviewed and updated.  ROS: Constitutional: Negative for fever, weight loss and weight gain. Cardiovascular: Negative for chest pain and dyspnea on exertion. Respiratory: Is not experiencing shortness of breath at rest. Gastrointestinal: Negative for nausea and vomiting. Neurological: Negative for headaches. Psychiatric: The patient is not nervous/anxious  Blood pressure 121/73.  PHYSICAL EXAM:  Exam: General: Well-developed, well-nourished Communication and Voice: intermittently raspy  Respiratory Respiratory effort: Equal inspiration and expiration without stridor Cardiovascular Peripheral Vascular: Warm extremities with equal color/perfusion Eyes: No nystagmus with equal extraocular motion bilaterally Neuro/Psych/Balance: Patient oriented  to person, place, and time; Appropriate mood and affect; Gait is intact with no imbalance; Cranial nerves I-XII are intact Head and Face Inspection: Normocephalic and atraumatic without mass or lesion Palpation: Facial skeleton intact without bony stepoffs Salivary  Glands: No mass or tenderness Facial Strength: Facial motility symmetric and full bilaterally ENT Pinna: External ear intact and fully developed External canal: Canal is patent with intact skin Tympanic Membrane: Clear and mobile External Nose: No scar or anatomic deformity Internal Nose: Septum is deviated to the left and with evidence of right sided caudal spur, narrowing both nares, but worse on the left side. No polyp, or purulence. Mucosal edema and erythema present.  Bilateral inferior turbinate hypertrophy.  Lips, Teeth, and gums: Mucosa and teeth intact and viable TMJ: No pain to palpation with full mobility Oral cavity/oropharynx: No erythema or exudate, no lesions present Nasopharynx: No mass or lesion with intact mucosa Hypopharynx: Intact mucosa without pooling of secretions Larynx Glottic: Full true vocal cord mobility without lesion or mass, but with VF atrophy Supraglottic: Normal appearing epiglottis and AE folds Interarytenoid Space: Moderate pachydermia edema in post-cricoid area Subglottic Space: Patent without lesion or edema Neck Neck and Trachea: Midline trachea without mass or lesion Thyroid: No mass or nodularity Lymphatics: No lymphadenopathy  PROCEDURE Summary of Video-Laryngeal-Stroboscopy: B/l VF are mobile, with atrophy, glottic insufficiency, mucosal wave intact and symmetric with normal amplitude, no lesions, moderate post-cricoid edema and pachydermia  Preoperative diagnosis: hoarseness  Postoperative diagnosis:   same  Procedure: Flexible fiberoptic laryngoscopy with stroboscopy (16109)  Surgeon: Ashok Croon, MD  Anesthesia: Topical lidocaine and Afrin  Complications: None  Condition is stable throughout exam  Indications and consent:   The patient presents to the clinic with hoarseness. All the risks, benefits, and potential complications were reviewed with the patient preoperatively and informed verbal consent was obtained.  Procedure:  The patient was seated upright in the exam chair.   Topical lidocaine and Afrin were applied to the nasal cavity. After adequate anesthesia had occurred, the flexible telescope was passed into the nasal cavity. The nasopharynx was patent without mass or lesion. The scope was passed behind the soft palate and directed toward the base of tongue. The base of tongue was visualized and was symmetric with no apparent masses or abnormal appearing tissue. There were no signs of a mass or pooling of secretions in the piriform sinuses. The supraglottic structures were normal.  The true vocal cords are mobile. The medial edges were bowed. Closure was incomplete. Periodicity present. The mucosal wave and amplitude were normal and symmetric. There is moderate interarytenoid pachydermia and post cricoid edema. The mucosa appears without lesions.   The laryngoscope was then slowly withdrawn and the patient tolerated the procedure well. There were no complications or blood loss.   Assessment/Plan: Encounter Diagnoses  Name Primary?   Glottic insufficiency Yes   Dysphonia    Gastroesophageal reflux disease without esophagitis    Vocal fold atrophy    Environmental and seasonal allergies    Post-nasal drainage    Hoarseness [R49.0]    Deviated nasal septum      71 yoM hx of CAD, GERD and URI 1 mo ago, treated with abx, here for persistent sore throat/throat discomfort, dysphonia and frequent throat clearing. Initiated Prilosec and feels somewhat improved but sx are still present. Also had cerumen impaction with muffled hearing, which improved after cerumen removal today. Denies trouble with swallowing, pain with swallowing or breathing.  On exam, including flexible laryngoscopy there  was evidence of nasal congestion PND and moderate post-cricoid edema pachydermia c/w GERD LPR. He had vocal fold atrophy without lesions that appears to be age-appropriate. We discussed exam findings and vocal hygiene. I offered  referral to see SLP, but he would like to hold off at this time. We will initiate management of GERD and PND, and will have him return for a videostrobe in 3 months.   - start Famotidine + Reflux Gourmet  - start Azelastine and Clarinex - diet and lifestyle changes to minimize reflux  - return in 3 months  - Sweet oil for ear wax and dry ears - SLP therapy if he decides in the future  Update 07/09/23 He has been doing Reflux Gourmet. He is on Azelastine and Clarinex.  Throat discomfort improved but he continues to have intermittent hoarseness. Does not wish to pursue voice therapy.  Hoarseness and dysphonia  -Likely due to vocal fold atrophy and glottic insufficiency -I discussed management options including voice therapy and injection augmentation but at this time patient would like to hold off 2.GERD/LPR -Continue reflux Gourmet as well as diet and lifestyle changes to minimize reflux -Okay to discontinue famotidine if no significant changes while not taking it 3. Nasal congestion/post-nasal drip/environmental allergies - continue Azelastine and Clarinex 4.  Cerumen impaction resolved following procedure last office visit - Sweet oil for ear wax and dry ears - f/u 1 year    Thank you for allowing me to participate in the care of this patient. Please do not hesitate to contact me with any questions or concerns.   Ashok Croon, MD Otolaryngology [facility] Phone: [phone] Fax: [phone]    07/09/2023, 5:16 PM

## 2023-07-09 NOTE — Patient Instructions (Signed)
-   continue Reflux Gourmet  - continue Azelastine and Clarinex - diet and lifestyle changes to minimize reflux  - Sweet oil for ear wax and dry ears

## 2023-07-22 ENCOUNTER — Other Ambulatory Visit (INDEPENDENT_AMBULATORY_CARE_PROVIDER_SITE_OTHER): Payer: Medicare Other

## 2023-07-22 DIAGNOSIS — Z23 Encounter for immunization: Secondary | ICD-10-CM | POA: Diagnosis not present

## 2023-08-20 ENCOUNTER — Other Ambulatory Visit: Payer: Self-pay

## 2023-08-20 ENCOUNTER — Ambulatory Visit: Payer: Medicare Other | Admitting: Sports Medicine

## 2023-08-20 ENCOUNTER — Encounter: Payer: Self-pay | Admitting: Sports Medicine

## 2023-08-20 DIAGNOSIS — M1812 Unilateral primary osteoarthritis of first carpometacarpal joint, left hand: Secondary | ICD-10-CM

## 2023-08-20 DIAGNOSIS — M79645 Pain in left finger(s): Secondary | ICD-10-CM

## 2023-08-20 DIAGNOSIS — G8929 Other chronic pain: Secondary | ICD-10-CM

## 2023-08-20 MED ORDER — BETAMETHASONE SOD PHOS & ACET 6 (3-3) MG/ML IJ SUSP
6.0000 mg | INTRAMUSCULAR | Status: AC | PRN
Start: 2023-08-20 — End: 2023-08-20
  Administered 2023-08-20: 6 mg via INTRA_ARTICULAR

## 2023-08-20 MED ORDER — LIDOCAINE HCL 1 % IJ SOLN
0.5000 mL | INTRAMUSCULAR | Status: AC | PRN
Start: 2023-08-20 — End: 2023-08-20
  Administered 2023-08-20: .5 mL

## 2023-08-20 NOTE — Progress Notes (Signed)
Patient says that it took awhile, but his thumb was feeling better after his last injection. He says it has gradually gotten worse again, and particularly in the last three weeks has made it difficult to perform daily tasks such as picking things up and tying his shoes. He says location of pain and type of pain is the same.

## 2023-08-20 NOTE — Progress Notes (Signed)
Edwin Jimenez - 72 y.o. male MRN [id number]  Date of birth: 10-31-1950  Office Visit Note: Visit Date: 08/20/2023 PCP: Ronnald Nian, MD Referred by: Ronnald Nian, MD  Subjective: Chief Complaint  Patient presents with   Left Hand - Pain    Injection   HPI: Edwin Jimenez Height "Edwin Jimenez" is a pleasant 72 y.o. male who presents today for acute on chronic left thumb pain with known CMC joint osteoarthritis.  Left thumb pain -known CMC arthritis.  Back in early April we did proceed with [facility] joint injection.  Within a few weeks to work but then his pain essentially went away for many months until healed the last few weeks his pain has been exacerbated.  He is having pain and difficulty with picking up items, tying shoes, even dropping items occasionally.  No numbness or tingling.  Pertinent ROS were reviewed with the patient and found to be negative unless otherwise specified above in HPI.   Assessment & Plan: Visit Diagnoses:  1. Arthritis of carpometacarpal (CMC) joint of left thumb   2. Chronic pain of left thumb    Plan: Impression is acute exacerbation of chronic left thumb pain with advanced CMC joint osteoarthritis. We did ultrasound the thumb to evaluate the joint, possible effusion and evaluate the overlying extensor tendons which appear intact.  I do think his pain is emanating from his advanced [facility] joint OA.  Through shared decision-making, we did proceed with [facility] joint injection, patient tolerated well but some pain from injection.  He may use ice as well as over-the-counter anti-inflammatories for the next 48-72 hours.  He does have a CMC brace, he may return in this as indicated for his pain.  If the injection settles this down again for many months, we can always consider infrequent injections.  I did discuss with Edwin Jimenez if the injections are no longer helping or only shorter in duration, we could always consider evaluation by her hand surgeon for possible fusion or other  surgical options, but we will certainly hold on this for now.  Will plan for repeat x-ray imaging at future visits.  Follow-up: Return if symptoms worsen or fail to improve.   Meds & Orders: No orders of the defined types were placed in this encounter.   Orders Placed This Encounter  Procedures   Small Joint Inj   [place] Extrem Up Left Ltd     Procedures: Small Joint Inj: L thumb CMC on 08/20/2023 9:40 AM Indications: pain Details: 25 G needle, radial approach Medications: 0.5 mL lidocaine 1 %; 6 mg betamethasone acetate-betamethasone sodium phosphate 6 (3-3) MG/ML Outcome: tolerated well, no immediate complications Procedure, treatment alternatives, risks and benefits explained, specific risks discussed. Consent was given by the patient. Immediately prior to procedure a time out was called to verify the correct patient, procedure, equipment, support staff and site/side marked as required. Patient was prepped and draped in the usual sterile fashion.         Clinical History: No specialty comments available.  He reports that he has never smoked. He has never used smokeless tobacco. No results for input(s): "HGBA1C", "LABURIC" in the last 8760 hours.  Objective:    Physical Exam  Gen: Well-appearing, in no acute distress; non-toxic CV:  Well-perfused. Warm.  Resp: Breathing unlabored on room air; no wheezing. Psych: Fluid speech in conversation; appropriate affect; normal thought process Neuro: Sensation intact throughout. No gross coordination deficits.   Ortho Exam - Left hand/thumb: + CMC grind  test, negative Finkelstein's test.  No redness swelling or effusion.  Cap refill less than 2 seconds.  Imaging: [place] Extrem Up Left Ltd  Result Date: 08/20/2023 Limited musculoskeletal ultrasound of the left thumb was performed today.  Distal radius was seen without cortical regularity.  The overlying extensor tendons of 1st DC was seen without tendinopathy or tenosynovitis.  Associated  neurovasculature of the radial artery and vein was identified and avoided during procedure.  The [facility] joint was visualized with joint space narrowing and some osteophytic change.  There is a trace effusion within the joint.   Osteoarthritis of the [facility] joint, overlying extensor tendons of first dorsal compartment intact   Narrative & Impression  CLINICAL DATA:  Chronic left thumb pain without known injury.   EXAM: LEFT THUMB 2+V   COMPARISON:  None Available.   FINDINGS: There is no evidence of fracture or dislocation. Moderate narrowing of the first carpometacarpal joint is noted. Soft tissues are unremarkable.   IMPRESSION: Moderate osteoarthritis of first carpometacarpal joint. No acute abnormality seen.     Electronically Signed   By: Lupita Raider M.D.   On: 01/02/2023 08:16    Past Medical/Family/Surgical/Social History: Medications & Allergies reviewed per EMR, new medications updated. Patient Active Problem List   Diagnosis Date Noted   Benign prostatic hyperplasia with nocturia 11/01/2021   Seasonal allergic rhinitis due to pollen 01/10/2020   Gastroesophageal reflux disease without esophagitis 01/10/2020   Flow murmur 07/28/2018   Family history of diabetes mellitus 09/22/2017   Age-related cataract of both eyes 09/22/2017   Eczema 09/22/2017   Rosacea 05/19/2014   Multiple lipomas 05/19/2014   Family history of ischemic heart disease (IHD) 09/04/2011   Hyperlipidemia with target LDL less than 70 09/04/2011   ED (erectile dysfunction) 09/04/2011   HALLUX RIGIDUS 07/11/2008   Past Medical History:  Diagnosis Date   Dyslipidemia    Dysrhythmia    ED (erectile dysfunction)    Hyperlipidemia    Family History  Problem Relation Age of Onset   Heart disease Father 36   Transient ischemic attack Father    Stroke Mother    Diabetes Sister    Hypertension Neg Hx    Past Surgical History:  Procedure Laterality Date   COLONOSCOPY     x2   CYSTOSCOPY WITH  RETROGRADE PYELOGRAM, URETEROSCOPY AND STENT PLACEMENT Right 05/21/2023   Procedure: CYSTOSCOPY WITH RIGHT  RETROGRADE PYELOGRAM, RIGHT URETEROSCOPY AND RIGHT URETERAL  STENT PLACEMENT;  Surgeon: Rene Paci, MD;  Location: [facility];  Service: Urology;  Laterality: Right;  45 MINUTES   EXCISION MASS ABDOMINAL Left    angiolipoma   HERNIA REPAIR  06/16/12   [facility]   INGUINAL HERNIA REPAIR  06/16/2012   Procedure: HERNIA REPAIR INGUINAL ADULT;  Surgeon: Valarie Merino, MD;  Location: [facility];  Service: General;  Laterality: Left;  left inguinal hernia repair with mesh   Social History   Occupational History   Not on file  Tobacco Use   Smoking status: Never   Smokeless tobacco: Never  Vaping Use   Vaping status: Never Used  Substance and Sexual Activity   Alcohol use: Yes    Comment: wine of weekends   Drug use: No   Sexual activity: Yes

## 2023-11-05 ENCOUNTER — Other Ambulatory Visit: Payer: Self-pay | Admitting: Medical Genetics

## 2023-11-25 ENCOUNTER — Other Ambulatory Visit (HOSPITAL_COMMUNITY)
Admission: RE | Admit: 2023-11-25 | Discharge: 2023-11-25 | Disposition: A | Discharge status: Home / Self Care | Payer: Self-pay | Source: Ambulatory Visit | Attending: Medical Genetics | Admitting: Medical Genetics

## 2023-11-28 ENCOUNTER — Other Ambulatory Visit: Payer: Self-pay | Admitting: Family Medicine

## 2023-12-02 ENCOUNTER — Encounter: Payer: Self-pay | Admitting: Internal Medicine

## 2023-12-08 ENCOUNTER — Telehealth: Payer: Self-pay

## 2023-12-08 DIAGNOSIS — Z833 Family history of diabetes mellitus: Secondary | ICD-10-CM

## 2023-12-08 DIAGNOSIS — J301 Allergic rhinitis due to pollen: Secondary | ICD-10-CM

## 2023-12-08 DIAGNOSIS — Z8249 Family history of ischemic heart disease and other diseases of the circulatory system: Secondary | ICD-10-CM

## 2023-12-08 DIAGNOSIS — E785 Hyperlipidemia, unspecified: Secondary | ICD-10-CM

## 2023-12-08 LAB — GENECONNECT MOLECULAR SCREEN: Genetic Analysis Overall Interpretation: NEGATIVE

## 2023-12-08 NOTE — Telephone Encounter (Signed)
 Copied from CRM [id number]. Topic: Clinical - Medical Advice >> Dec 08, 2023  4:11 PM Yolanda T wrote: Reason for CRM: patient asking if he needs to come in a week before his appt on 3/25 for labs. Please f/u with patient  Do you want pt. To come in before appt. If so can you put the orders in.

## 2023-12-23 ENCOUNTER — Other Ambulatory Visit

## 2023-12-23 DIAGNOSIS — Z8249 Family history of ischemic heart disease and other diseases of the circulatory system: Secondary | ICD-10-CM

## 2023-12-23 DIAGNOSIS — E785 Hyperlipidemia, unspecified: Secondary | ICD-10-CM

## 2023-12-23 LAB — LIPID PANEL

## 2023-12-24 ENCOUNTER — Encounter: Payer: Self-pay | Admitting: Family Medicine

## 2023-12-24 LAB — COMPREHENSIVE METABOLIC PANEL
ALT: 57 IU/L — ABNORMAL HIGH (ref 0–44)
AST: 43 IU/L — ABNORMAL HIGH (ref 0–40)
Albumin: 4.1 g/dL (ref 3.8–4.8)
Alkaline Phosphatase: 92 IU/L (ref 44–121)
BUN/Creatinine Ratio: 24 (ref 10–24)
BUN: 21 mg/dL (ref 8–27)
Bilirubin Total: 0.7 mg/dL (ref 0.0–1.2)
CO2: 23 mmol/L (ref 20–29)
Calcium: 8.8 mg/dL (ref 8.6–10.2)
Chloride: 108 mmol/L — ABNORMAL HIGH (ref 96–106)
Creatinine, Ser: 0.87 mg/dL (ref 0.76–1.27)
Globulin, Total: 2 g/dL (ref 1.5–4.5)
Glucose: 81 mg/dL (ref 70–99)
Potassium: 4.3 mmol/L (ref 3.5–5.2)
Sodium: 143 mmol/L (ref 134–144)
Total Protein: 6.1 g/dL (ref 6.0–8.5)
eGFR: 92 mL/min/{1.73_m2} (ref >59)

## 2023-12-24 LAB — LIPID PANEL
Cholesterol, Total: 128 mg/dL (ref 100–199)
HDL: 56 mg/dL (ref >39)
LDL CALC COMMENT:: 2.3 ratio (ref 0.0–5.0)
LDL Chol Calc (NIH): 60 mg/dL (ref 0–99)
Triglycerides: 52 mg/dL (ref 0–149)
VLDL Cholesterol Cal: 12 mg/dL (ref 5–40)

## 2023-12-24 LAB — CBC WITH DIFFERENTIAL/PLATELET
Basophils Absolute: 0 10*3/uL (ref 0.0–0.2)
Basos: 1 %
EOS (ABSOLUTE): 0.1 10*3/uL (ref 0.0–0.4)
Eos: 3 %
Hematocrit: 43.6 % (ref 37.5–51.0)
Hemoglobin: 14.9 g/dL (ref 13.0–17.7)
Immature Grans (Abs): 0 10*3/uL (ref 0.0–0.1)
Immature Granulocytes: 0 %
Lymphocytes Absolute: 1.7 10*3/uL (ref 0.7–3.1)
Lymphs: 43 %
MCH: 32.8 pg (ref 26.6–33.0)
MCHC: 34.2 g/dL (ref 31.5–35.7)
MCV: 96 fL (ref 79–97)
Monocytes Absolute: 0.3 10*3/uL (ref 0.1–0.9)
Monocytes: 8 %
Neutrophils Absolute: 1.7 10*3/uL (ref 1.4–7.0)
Neutrophils: 45 %
Platelets: 167 10*3/uL (ref 150–450)
RBC: 4.54 x10E6/uL (ref 4.14–5.80)
RDW: 11.8 % (ref 11.6–15.4)
WBC: 3.9 10*3/uL (ref 3.4–10.8)

## 2023-12-24 MED ORDER — ATORVASTATIN CALCIUM 40 MG PO TABS: 40.0000 mg | ORAL_TABLET | Freq: Every day | ORAL | 3 refills | Status: AC

## 2023-12-30 ENCOUNTER — Ambulatory Visit (INDEPENDENT_AMBULATORY_CARE_PROVIDER_SITE_OTHER): Payer: Medicare Other | Admitting: Family Medicine

## 2023-12-30 ENCOUNTER — Encounter: Payer: Self-pay | Admitting: Family Medicine

## 2023-12-30 VITALS — BP 118/70 | HR 54 | Ht 67.0 in | Wt 132.2 lb

## 2023-12-30 DIAGNOSIS — K219 Gastro-esophageal reflux disease without esophagitis: Secondary | ICD-10-CM | POA: Diagnosis not present

## 2023-12-30 DIAGNOSIS — L719 Rosacea, unspecified: Secondary | ICD-10-CM

## 2023-12-30 DIAGNOSIS — H25013 Cortical age-related cataract, bilateral: Secondary | ICD-10-CM

## 2023-12-30 DIAGNOSIS — J301 Allergic rhinitis due to pollen: Secondary | ICD-10-CM

## 2023-12-30 DIAGNOSIS — Z23 Encounter for immunization: Secondary | ICD-10-CM

## 2023-12-30 DIAGNOSIS — Z8249 Family history of ischemic heart disease and other diseases of the circulatory system: Secondary | ICD-10-CM

## 2023-12-30 DIAGNOSIS — N5201 Erectile dysfunction due to arterial insufficiency: Secondary | ICD-10-CM | POA: Diagnosis not present

## 2023-12-30 DIAGNOSIS — L308 Other specified dermatitis: Secondary | ICD-10-CM | POA: Diagnosis not present

## 2023-12-30 DIAGNOSIS — E785 Hyperlipidemia, unspecified: Secondary | ICD-10-CM

## 2023-12-30 DIAGNOSIS — Z833 Family history of diabetes mellitus: Secondary | ICD-10-CM

## 2023-12-30 DIAGNOSIS — Z0000 Encounter for general adult medical examination without abnormal findings: Secondary | ICD-10-CM | POA: Diagnosis not present

## 2023-12-30 DIAGNOSIS — R011 Cardiac murmur, unspecified: Secondary | ICD-10-CM

## 2023-12-30 DIAGNOSIS — N401 Enlarged prostate with lower urinary tract symptoms: Secondary | ICD-10-CM

## 2023-12-30 DIAGNOSIS — D179 Benign lipomatous neoplasm, unspecified: Secondary | ICD-10-CM

## 2023-12-30 MED ORDER — METRONIDAZOLE 0.75 % EX CREA
TOPICAL_CREAM | Freq: Every day | CUTANEOUS | 3 refills | Status: AC
Start: 2023-12-30 — End: ?

## 2023-12-30 NOTE — Progress Notes (Signed)
 Edwin Jimenez is a 74 y.o. male who presents for annual wellness visit ,CPE and follow-up on chronic medical conditions.  He has no particular concerns or complaints.  Does have a previous history of the neurological difficulty with twisting of the ureter causing pain.  He is followed by urology for this.  He also has BPH and is on finasteride.  He does have cataracts and did have the left cataract removed.  He cannot really tell much difference and so he has not had the right 1 done yet.  His allergies seem to be under good control.  Continues on statin and is having no difficulty with this although his recent blood work did show slight increase in his enzymes which has occurred in the past.  He does have eczema and recently has noted some rosacea and would like medication that he has used in the past for this.  He does have a history of GERD but is having no difficulty at the present time and does use meds on an as-needed basis.   Immunizations and Health Maintenance Immunization History  Administered Date(s) Administered   Fluad Quad(high Dose 65+) 07/14/2019, 08/14/2020, 06/28/2021, 07/17/2022   Fluad Trivalent(High Dose 65+) 07/22/2023   Hepatitis A 08/31/2002, 03/09/2003   Hepatitis B 12/11/2006, 01/13/2007, 06/16/2007   IPV 04/20/1999   Influenza, High Dose Seasonal PF 08/04/2017, 07/27/2018   Influenza,inj,Quad PF,6+ Mos 05/19/2014, 08/01/2015   Influenza-Unspecified 07/30/2016   PFIZER(Purple Top)SARS-COV-2 Vaccination 10/29/2019, 11/19/2019, 07/06/2020   PNEUMOCOCCAL CONJUGATE-20 12/30/2023   Pfizer(Comirnaty)Fall Seasonal Vaccine 12 years and older 06/28/2022   Pneumococcal Conjugate-13 10/22/2016   Pneumococcal Polysaccharide-23 09/22/2017   Td 12/09/2006   Tdap 02/20/2011, 12/11/2021   Typhoid Inactivated 02/20/2011   Zoster Recombinant(Shingrix) 01/01/2023, 03/04/2023   Zoster, Live 03/10/2013   Health Maintenance Due  Topic Date Due   COVID-19 Vaccine (5 - 2024-25  season) 06/08/2023    Last colonoscopy:2013, Cologuard 2023 Last PSA: 2024 Dentist:2024 Ophtho:2024 Exercise:runs daily  Other doctors caring for patient include: Optho: Dr Lorin Picket Dentist: Dr. Elwyn Reach Derm-Dr. Margo Aye GI-Dr. Elnoria Howard Ortho-Dr. Shon Baton ENT-Dr. Irene Pap Cards-Dr. Clarene Duke- Dr. Liliane Shi    Advanced Directives: Does Patient Have a Medical Advance Directive?: Yes Type of Advance Directive: Healthcare Power of Attorney, Living will, Out of facility DNR (pink MOST or yellow form) Does patient want to make changes to medical advance directive?: No - Patient declined Copy of Healthcare Power of Attorney in Chart?: Yes - validated most recent copy scanned in chart (See row information)  Depression screen:  See questionnaire below.        12/30/2023    8:23 AM 12/24/2022    8:15 AM 12/19/2021    3:02 PM 08/17/2021    8:29 AM 03/29/2020    1:47 PM  Depression screen PHQ 2/9  Decreased Interest 0 0 0 0 0  Down, Depressed, Hopeless 0 0 0 0 0  PHQ - 2 Score 0 0 0 0 0    Fall Screen: See Questionaire below.      12/30/2023    8:23 AM 12/24/2022    8:15 AM 12/19/2021    3:01 PM 08/17/2021    8:29 AM 01/10/2020    9:45 AM  Fall Risk   Falls in the past year? 0 0 0 0 0  Number falls in past yr: 0 0 0 0   Comment   tripped running with dog    Injury with Fall? 0 0 1 0   Risk for fall due to :  No Fall Risks No Fall Risks No Fall Risks No Fall Risks   Follow up Falls evaluation completed Falls evaluation completed Falls evaluation completed Falls evaluation completed     ADL screen:  See questionnaire below.  Functional Status Survey: Is the patient deaf or have difficulty hearing?: No Does the patient have difficulty seeing, even when wearing glasses/contacts?: No Does the patient have difficulty concentrating, remembering, or making decisions?: No Does the patient have difficulty walking or climbing stairs?: No Does the patient have difficulty dressing or bathing?:  No Does the patient have difficulty doing errands alone such as visiting a doctor's office or shopping?: No   Review of Systems  Constitutional: -, -unexpected weight change, -anorexia, -fatigue Allergy: -sneezing, -itching, -congestion Dermatology: denies changing moles, rash, lumps ENT: -runny nose, -ear pain, -sore throat,  Cardiology:  -chest pain, -palpitations, -orthopnea, Respiratory: -cough, -shortness of breath, -dyspnea on exertion, -wheezing,  Gastroenterology: -abdominal pain, -nausea, -vomiting, -diarrhea, -constipation, -dysphagia Hematology: -bleeding or bruising problems Musculoskeletal: -arthralgias, -myalgias, -joint swelling, -back pain, - Ophthalmology: -vision changes,  Urology: -dysuria, -difficulty urinating,  -urinary frequency, -urgency, incontinence Neurology: -, -numbness, , -memory loss, -falls, -dizziness    PHYSICAL EXAM:  BP 118/70   Pulse (!) 54   Ht 5\' 7"  (1.702 m)   Wt 132 lb 3.2 oz (60 kg)   BMI 20.71 kg/m   General Appearance: Alert, cooperative, no distress, appears stated age Head: Normocephalic, without obvious abnormality, atraumatic Eyes: PERRL, conjunctiva/corneas clear, EOM's intact, Ears: Normal TM's and external ear canals Nose: Nares normal, mucosa normal, no drainage or sinus   tenderness Throat: Lips, mucosa, and tongue normal; teeth and gums normal Neck: Supple, no lymphadenopathy, thyroid:no enlargement/tenderness/nodules; no carotid bruit or JVD Lungs: Clear to auscultation bilaterally without wheezes, rales or ronchi; respirations unlabored Heart: Regular rate and rhythm, S1 and S2 normal, no murmur, rub or gallop Abdomen: Soft, non-tender, nondistended, normoactive bowel sounds, no masses, no hepatosplenomegaly Extremities: No clubbing, cyanosis or edema Pulses: 2+ and symmetric all extremities Skin: Skin color, texture, turgor normal, no rashes multiple lipomas noted Lymph nodes: Cervical, supraclavicular, and axillary  nodes normal Neurologic: CNII-XII intact, normal strength, sensation and gait; reflexes 2+ and symmetric throughout   Psych: Normal mood, affect, hygiene and grooming The blood work was reviewed with him. ASSESSMENT/PLAN: Routine general medical examination at a health care facility  Cortical age-related cataract of both eyes  Benign prostatic hyperplasia with nocturia  Other eczema  Erectile dysfunction due to arterial insufficiency  Family history of ischemic heart disease (IHD)  Family history of diabetes mellitus  Flow murmur  Gastroesophageal reflux disease without esophagitis  Hyperlipidemia with target LDL less than 70  Seasonal allergic rhinitis due to pollen  Multiple lipomas  Need for vaccination against Streptococcus pneumoniae - Plan: Pneumococcal conjugate vaccine 20-valent (Prevnar 20)  Rosacea - Plan: metroNIDAZOLE (METROCREAM) 0.75 % cream    Discussed PSA screening (risks/benefits), recommended at least 30 minutes of aerobic activity at least 5 days/week; proper sunscreen use reviewed; healthy diet and alcohol recommendations (less than or equal to 2 drinks/day) reviewed; regular seatbelt use; changing batteries in smoke detectors. Immunization recommendations discussed.  Colonoscopy recommendations reviewed. He will call me when he needs a refill on his Cialis.  He will continue on his present medication regimen and follow-up with urology as well as occasionally with cardiology.  In the past he has had some sports medicine related issues and recommend he return here for follow-up on that to see Dr. Benjiman Core  Medicare Attestation I have personally reviewed: The patient's medical and social history Their use of alcohol, tobacco or illicit drugs Their current medications and supplements The patient's functional ability including ADLs,fall risks, home safety risks, cognitive, and hearing and visual impairment Diet and physical activities Evidence for depression or  mood disorders  The patient's weight, height, and BMI have been recorded in the chart.  I have made referrals, counseling, and provided education to the patient based on review of the above and I have provided the patient with a written personalized care plan for preventive services.     Sharlot Gowda, MD   12/30/2023

## 2024-02-18 ENCOUNTER — Other Ambulatory Visit: Payer: Self-pay

## 2024-02-18 ENCOUNTER — Encounter: Payer: Self-pay | Admitting: Sports Medicine

## 2024-02-18 ENCOUNTER — Ambulatory Visit: Admitting: Sports Medicine

## 2024-02-18 ENCOUNTER — Other Ambulatory Visit (INDEPENDENT_AMBULATORY_CARE_PROVIDER_SITE_OTHER)

## 2024-02-18 DIAGNOSIS — G8929 Other chronic pain: Secondary | ICD-10-CM | POA: Diagnosis not present

## 2024-02-18 DIAGNOSIS — M79645 Pain in left finger(s): Secondary | ICD-10-CM

## 2024-02-18 DIAGNOSIS — M1812 Unilateral primary osteoarthritis of first carpometacarpal joint, left hand: Secondary | ICD-10-CM | POA: Diagnosis not present

## 2024-02-18 MED ORDER — LIDOCAINE HCL 1 % IJ SOLN
0.5000 mL | INTRAMUSCULAR | Status: AC | PRN
Start: 2024-02-18 — End: 2024-02-18
  Administered 2024-02-18: .5 mL

## 2024-02-18 MED ORDER — BETAMETHASONE SOD PHOS & ACET 6 (3-3) MG/ML IJ SUSP
3.0000 mg | INTRAMUSCULAR | Status: AC | PRN
Start: 2024-02-18 — End: 2024-02-18
  Administered 2024-02-18: 3 mg via INTRA_ARTICULAR

## 2024-02-18 NOTE — Progress Notes (Signed)
 Edwin Jimenez - 73 y.o. male MRN [id number]  Date of birth: 03-13-1951  Office Visit Note: Visit Date: 02/18/2024 PCP: Watson Hacking, MD Referred by: Watson Hacking, MD  Subjective: Chief Complaint  Patient presents with   Left Hand - Pain    Injection   HPI: Edwin Jimenez is a pleasant 73 y.o. male who presents today for follow-up of chronic left thumb pain with CMC OA.  I saw Edwin Jimenez back in November of last year.  He did have advanced arthritis in the [facility] joint.  I did perform a CMC joint injection at that time, he took almost 2 weeks to fully kick in but then this did give him relief but not full relief of his symptoms.  He does wear his cool comfort CMC brace.  He is not taking any consistent medication for this.  Recently here his pain has been exacerbated and quite bothersome, he will feel a sharp zinging pain with basic movements or if he bumps the thumb.  Pertinent ROS were reviewed with the patient and found to be negative unless otherwise specified above in HPI.   Assessment & Plan: Visit Diagnoses:  1. Arthritis of carpometacarpal (CMC) joint of left thumb   2. Chronic pain of left thumb    Plan: Impression is chronic left thumb pain with advanced CMC joint arthritis with an active exacerbation.  He has received partial relief from previous injections in the past, last one was back in November.  Here recently his pain has been quite bothersome that basic movements or bumping the thumb will cause a severe zinging pain within the thumb.  We did repeat x-rays which do show moderate to severe OA.  At this point, I would like to try an ultrasound-guided [facility] joint injection followed by 2-week course of meloxicam  15 mg daily (may also use Celebrex 100mg  BID instead if desires).  He will continue in his [facility] cool comfort brace for support with activity/use.   He will send me a message in 1 month giving me an update on his progress and improvement from this.  If for some  reason, he is not receiving significant relief from the above treatment, we could consider sending him to my hand surgeon, Dr. Merlinda Starling, for discussion on [facility] arthroplasty, although both Edwin Jimenez and I are hopeful that it does not get to this point.  Follow-up: Return for will update/mychart messge me in 55-month on improvement/progress.   Meds & Orders: No orders of the defined types were placed in this encounter.   - Meloxicam  15mg  every day x 2 weeks  Orders Placed This Encounter  Procedures   Small Joint Inj   XR Finger Thumb Left   US  Guided Needle Placement - No Linked Charges     Procedures: Small Joint Inj: L thumb CMC on 02/18/2024 9:50 AM Indications: pain Details: 25 G needle, ultrasound-guided radial approach Medications: 0.5 mL lidocaine  1 %; 3 mg betamethasone  acetate-betamethasone  sodium phosphate  6 (3-3) MG/ML Outcome: tolerated well, no immediate complications  Procedure: Ultrasound-guided CMC joint injection, Left thumb After informed verbal consent was obtained a timeout was performed, patient was seated on exam table. The patient's left hand was placed on stable surface with medial hand on table.  Identification of the [facility] joint was performed in long axis under ultrasound guidance. Utilizing an in-plane approach, a 25-gauge, 5/8" needle was inserted under ultrasound guidance utilizing a long-axis, in-plane technique and visualized with needle entrance into the [facility] joint. The  joint was subsequently injected with a 0.5:0.5 cc of lidocaine :betamethasone  combination with physical visitation of injectate spread into the joint space.  Patient tolerated the procedure well without immediate complications.  Procedure, treatment alternatives, risks and benefits explained, specific risks discussed. Consent was given by the patient. Immediately prior to procedure a time out was called to verify the correct patient, procedure, equipment, support staff and site/side marked as required. Patient was  prepped and draped in the usual sterile fashion.          Clinical History: No specialty comments available.  He reports that he has never smoked. He has never used smokeless tobacco. No results for input(s): "HGBA1C", "LABURIC" in the last 8760 hours.  Objective:    Physical Exam  Gen: Well-appearing, in no acute distress; non-toxic CV: Well-perfused. Warm.  Resp: Breathing unlabored on room air; no wheezing. Psych: Fluid speech in conversation; appropriate affect; normal thought process  Ortho Exam - Left thumb: There is tenderness palpating over the [facility] joint.  Positive CMC grind test.  There is a trace to small effusion present without significant warmth or redness.  There is pain with manipulation about the thumb.  Cap refill less than 2 seconds.  Imaging: XR Finger Thumb Left Result Date: 02/18/2024 4 view x-ray of the left thumb including AP, lateral, oblique and Adelene Homer view were ordered and reviewed by myself today.  X-rays demonstrate moderate to severe osteoarthritic and joint space narrowing at the [facility] joint with a degree of subchondral sclerosis,significant spurring or osteophytosis.   Past Medical/Family/Surgical/Social History: Medications & Allergies reviewed per EMR, new medications updated. Patient Active Problem List   Diagnosis Date Noted   Benign prostatic hyperplasia with nocturia 11/01/2021   Seasonal allergic rhinitis due to pollen 01/10/2020   Gastroesophageal reflux disease without esophagitis 01/10/2020   Flow murmur 07/28/2018   Family history of diabetes mellitus 09/22/2017   Age-related cataract of both eyes 09/22/2017   Eczema 09/22/2017   Rosacea 05/19/2014   Multiple lipomas 05/19/2014   Family history of ischemic heart disease (IHD) 09/04/2011   Hyperlipidemia with target LDL less than 70 09/04/2011   ED (erectile dysfunction) 09/04/2011   HALLUX RIGIDUS 07/11/2008   Past Medical History:  Diagnosis Date   Dyslipidemia    Dysrhythmia     ED (erectile dysfunction)    Hyperlipidemia    Family History  Problem Relation Age of Onset   Heart disease Father 56   Transient ischemic attack Father    Stroke Mother    Diabetes Sister    Hypertension Neg Hx    Past Surgical History:  Procedure Laterality Date   COLONOSCOPY     x2   CYSTOSCOPY WITH RETROGRADE PYELOGRAM, URETEROSCOPY AND STENT PLACEMENT Right 05/21/2023   Procedure: CYSTOSCOPY WITH RIGHT  RETROGRADE PYELOGRAM, RIGHT URETEROSCOPY AND RIGHT URETERAL  STENT PLACEMENT;  Surgeon: Adelbert Homans, MD;  Location: [facility];  Service: Urology;  Laterality: Right;  45 MINUTES   EXCISION MASS ABDOMINAL Left    angiolipoma   HERNIA REPAIR  06/16/12   [facility]   INGUINAL HERNIA REPAIR  06/16/2012   Procedure: HERNIA REPAIR INGUINAL ADULT;  Surgeon: Azucena Bollard, MD;  Location: [facility];  Service: General;  Laterality: Left;  left inguinal hernia repair with mesh   Social History   Occupational History   Not on file  Tobacco Use   Smoking status: Never   Smokeless tobacco: Never  Vaping Use   Vaping status: Never Used  Substance  and Sexual Activity   Alcohol use: Yes    Comment: wine of weekends   Drug use: No   Sexual activity: Yes

## 2024-02-18 NOTE — Progress Notes (Signed)
 Patient says that his thumb has gotten progressively more painful. He says that his last injection took about 2 weeks to kick in, and he got about 50% relief. He has been wearing his Cool Comfort brace more recently, and it does help. He is not taking any medication or doing anything else to treat his thumb pain.

## 2024-03-18 ENCOUNTER — Encounter: Payer: Self-pay | Admitting: Sports Medicine

## 2024-03-22 ENCOUNTER — Encounter (INDEPENDENT_AMBULATORY_CARE_PROVIDER_SITE_OTHER): Payer: Self-pay | Admitting: Otolaryngology

## 2024-03-25 ENCOUNTER — Other Ambulatory Visit: Payer: Self-pay | Admitting: Sports Medicine

## 2024-03-25 DIAGNOSIS — M1812 Unilateral primary osteoarthritis of first carpometacarpal joint, left hand: Secondary | ICD-10-CM

## 2024-03-25 NOTE — Telephone Encounter (Signed)
 Referral sent

## 2024-04-21 ENCOUNTER — Ambulatory Visit: Admitting: Orthopedic Surgery

## 2024-04-21 DIAGNOSIS — M1812 Unilateral primary osteoarthritis of first carpometacarpal joint, left hand: Secondary | ICD-10-CM

## 2024-04-21 NOTE — Addendum Note (Signed)
 Addended by: Turquoise Esch on: 04/21/2024 07:30 PM   Modules accepted: Level of Service

## 2024-04-21 NOTE — Progress Notes (Signed)
 Edwin Jimenez - 73 y.o. male MRN [id number]  Date of birth: [birth year]  Office Visit Note: Visit Date: 04/21/2024 PCP: Joyce Norleen BROCKS, MD Referred by: Burnetta Brunet, DO  Subjective: No chief complaint on file.  HPI: Edwin Jimenez is a pleasant 73 y.o. male who presents today for evaluation of ongoing left thumb CMC arthritis.  He presents as a referral from Dr. Burnetta for specific hand surgical evaluation.  He has undergone extensive nonoperative care with bracing, activity modification and previous injections x 2.  Symptoms have remained refractory conservative care at this time.  He denies any significant numbness or tingling.  He is overall very active and healthy at baseline.  Pertinent ROS were reviewed with the patient and found to be negative unless otherwise specified above in HPI.   Visit Reason:Left thumb cmc arthritis-Dr Burnetta referral Duration of symptoms:pain for years Hand dominance: right Occupation:Retired Diabetic: No Smoking: No Heart/Lung History:has super venticular tach at times Blood Thinners: low does aspirin  a couple times a week  Prior Testing/EMG:xray Injections (Date):by Dr burnetta on 08/20/23/ and 02/18/24-helped for couple months Treatments:Tried brace but not wearing it Prior Surgery:none  Assessment & Plan: Visit Diagnoses:  1. Arthritis of carpometacarpal (CMC) joint of left thumb     Plan: Extensive discussion was had with the patient today regarding his ongoing left thumb CMC osteoarthritis.  X-rays were reviewed in detail today which do show significant degenerative change at the left thumb [facility] articulation.  We discussed treatment modalities ranging from conservative to surgical.  From a conservative standpoint we discussed bracing, injections, anti-inflammatory medications and activity modification.  From a surgical standpoint we discussed [facility] arthroplasty, risks and benefits as well as the postoperative protocol.  At this  juncture, understanding the surgical treatment and postoperative protocol, patient would like to continue with conservative treatment at this time.  He will return in the future for further discussion once he is ready.  Risks and benefits of the procedure were discussed, risks including but not limited to infection, bleeding, scarring, stiffness, nerve injury, tendon injury, vascular injury, hardware complication, recurrence of symptoms and need for subsequent operation.  We also discussed the specifics of the postoperative protocol and the appropriate timeline.  Patient expressed understanding.   Follow-up: No follow-ups on file.   Meds & Orders: No orders of the defined types were placed in this encounter.  No orders of the defined types were placed in this encounter.    Procedures: No procedures performed      Clinical History: No specialty comments available.  He reports that he has never smoked. He has never used smokeless tobacco. No results for input(s): HGBA1C, LABURIC in the last 8760 hours.  Objective:   Vital Signs: There were no vitals taken for this visit.  Physical Exam  Gen: Well-appearing, in no acute distress; non-toxic CV: Regular Rate. Well-perfused. Warm.  Resp: Breathing unlabored on room air; no wheezing. Psych: Fluid speech in conversation; appropriate affect; normal thought process  Ortho Exam General: Patient is well appearing and in no distress. Cervical spine mobility is full in all directions:   Skin and Muscle: No skin changes are apparent to upper extremities.  Muscle bulk and contour normal, no signs of atrophy.      Range of Motion and Palpation Tests: Mobility is full about the elbows with flexion and extension.  Forearm supination and pronation are 85/85 bilaterally.  Wrist flexion/extension is 75/65 bilaterally.  Digital flexion and extension are full.  Thumb opposition is full to the base of the small fingers bilaterally.     No cords or  nodules are palpated.  No triggering is observed.     Significant tenderness over the left thumb CMC articulation is observed, positive grind for pain, positive crepitus.  MP hyperextension positive, approximately 30 degrees.    Finklestein test mildly positive   Neurologic, Vascular, Motor: Sensation is intact to light touch in the median/radial/ulnar distributions.  Tinel's testing negative at wrist level.  Fingers pink and well perfused.  Capillary refill is brisk.     Imaging: No results found. Prior left thumb x-rays were reviewed which show significant thumb CMC arthritis with associated MP hyperextension  Past Medical/Family/Surgical/Social History: Medications & Allergies reviewed per EMR, new medications updated. Patient Active Problem List   Diagnosis Date Noted   Benign prostatic hyperplasia with nocturia 11/01/2021   Seasonal allergic rhinitis due to pollen 01/10/2020   Gastroesophageal reflux disease without esophagitis 01/10/2020   Flow murmur 07/28/2018   Family history of diabetes mellitus 09/22/2017   Age-related cataract of both eyes 09/22/2017   Eczema 09/22/2017   Rosacea 05/19/2014   Multiple lipomas 05/19/2014   Family history of ischemic heart disease (IHD) 09/04/2011   Hyperlipidemia with target LDL less than 70 09/04/2011   ED (erectile dysfunction) 09/04/2011   HALLUX RIGIDUS 07/11/2008   Past Medical History:  Diagnosis Date   Dyslipidemia    Dysrhythmia    ED (erectile dysfunction)    Hyperlipidemia    Family History  Problem Relation Age of Onset   Heart disease Father 83   Transient ischemic attack Father    Stroke Mother    Diabetes Sister    Hypertension Neg Hx    Past Surgical History:  Procedure Laterality Date   COLONOSCOPY     x2   CYSTOSCOPY WITH RETROGRADE PYELOGRAM, URETEROSCOPY AND STENT PLACEMENT Right 05/21/2023   Procedure: CYSTOSCOPY WITH RIGHT  RETROGRADE PYELOGRAM, RIGHT URETEROSCOPY AND RIGHT URETERAL  STENT PLACEMENT;   Surgeon: Devere Lonni Righter, MD;  Location: [facility];  Service: Urology;  Laterality: Right;  45 MINUTES   EXCISION MASS ABDOMINAL Left    angiolipoma   [facility]16/12   [facility]   INGUINAL HERNIA REPAIR  06/16/2012   Procedure: HERNIA REPAIR INGUINAL ADULT;  Surgeon: Donnice KATHEE Lunger, MD;  Location: [facility];  Service: General;  Laterality: Left;  left inguinal hernia repair with mesh   Social History   Occupational History   Not on file  Tobacco Use   Smoking status: Never   Smokeless tobacco: Never  Vaping Use   Vaping status: Never Used  Substance and Sexual Activity   Alcohol use: Yes    Comment: wine of weekends   Drug use: No   Sexual activity: Yes    Brennan Litzinger Afton Alderton, M.D. [facility], Hand Surgery

## 2024-04-28 ENCOUNTER — Other Ambulatory Visit (INDEPENDENT_AMBULATORY_CARE_PROVIDER_SITE_OTHER): Payer: Self-pay | Admitting: Otolaryngology

## 2024-04-29 ENCOUNTER — Ambulatory Visit: Admitting: Sports Medicine

## 2024-04-29 ENCOUNTER — Encounter: Payer: Self-pay | Admitting: Sports Medicine

## 2024-04-29 VITALS — BP 108/68 | Ht 67.0 in | Wt 132.0 lb

## 2024-04-29 DIAGNOSIS — S39011A Strain of muscle, fascia and tendon of abdomen, initial encounter: Secondary | ICD-10-CM | POA: Diagnosis not present

## 2024-04-29 NOTE — Assessment & Plan Note (Signed)
 I reassured him that I thought his hip joint was fine I think he can gradually increase some abdominal exercises to help strengthen and heal this Currently he cannot do a full crunch so he should keep his feet on the ground while he does many crunches He will hold his right groin and low abdomen while doing straight leg raises and hip flexion exercises  He can run as long as it is comfortable and pain level is less than 3 He should avoid speed work or hills until this heals much better  If not responding in the next month he should return to see me

## 2024-04-29 NOTE — Progress Notes (Signed)
 Chief complaint right hip pain after running  Patient is a retired Teacher, early years/pre.  He has been a long-term runner including the [employer].  He currently has some pain that started about 10 days ago after doing a longer run along the [place].  They were stopping frequently to drink water because it was very hot.  Each time he was started happy to get pain on the anterior portion of and just superior to his right hip.  This made it hard to lift the right leg and to walk without discomfort.  Over the last 4 to 5 days he is only run a total of about 4 miles and his pain is much less. He can now walk without significant discomfort or limp  He has a history of a remote avulsion fracture of his pelvis that I saw after a marathon run.  Physical exam Thin athletic white male in no acute distress BP 108/68 (BP Location: Left Arm, Patient Position: Sitting)   Ht 5' 7 (1.702 m)   Wt 132 lb (59.9 kg)   BMI 20.67 kg/m   Hip range of motion is completely normal both right and left Hip flexion abduction and adduction strength are normal Tenderness to palpation is located where his abdominal oblique comes into the inguinal ligament Pubic bone is not tender Has no findings to suggest hernia  Abdominal crunch and straight leg raise both reproduce the pain in his lower abdomen

## 2024-05-27 ENCOUNTER — Ambulatory Visit: Admitting: Sports Medicine

## 2024-05-28 ENCOUNTER — Ambulatory Visit (HOSPITAL_BASED_OUTPATIENT_CLINIC_OR_DEPARTMENT_OTHER): Admitting: Cardiology

## 2024-05-28 ENCOUNTER — Encounter (HOSPITAL_BASED_OUTPATIENT_CLINIC_OR_DEPARTMENT_OTHER): Payer: Self-pay | Admitting: Cardiology

## 2024-05-28 VITALS — BP 116/70 | HR 62 | Resp 16 | Ht 67.0 in | Wt 132.0 lb

## 2024-05-28 DIAGNOSIS — I951 Orthostatic hypotension: Secondary | ICD-10-CM | POA: Diagnosis not present

## 2024-05-28 DIAGNOSIS — I471 Supraventricular tachycardia, unspecified: Secondary | ICD-10-CM

## 2024-05-28 DIAGNOSIS — I2510 Atherosclerotic heart disease of native coronary artery without angina pectoris: Secondary | ICD-10-CM

## 2024-05-28 DIAGNOSIS — R011 Cardiac murmur, unspecified: Secondary | ICD-10-CM

## 2024-05-28 DIAGNOSIS — E7800 Pure hypercholesterolemia, unspecified: Secondary | ICD-10-CM

## 2024-05-28 MED ORDER — METOPROLOL TARTRATE 25 MG PO TABS
12.5000 mg | ORAL_TABLET | Freq: Every day | ORAL | 11 refills | Status: AC | PRN
Start: 2024-05-28 — End: 2024-08-26

## 2024-05-28 MED ORDER — ASPIRIN 81 MG PO TBEC
81.0000 mg | DELAYED_RELEASE_TABLET | ORAL | Status: AC
Start: 2024-05-28 — End: ?

## 2024-05-28 NOTE — Progress Notes (Signed)
 Cardiology Office Note:  .   Date:  05/28/2024  ID:  Edwin Jimenez, DOB [birth year], MRN [id number] PCP: Joyce Norleen BROCKS, MD  [facility] Providers Cardiologist:  Shelda Bruckner, MD {  History of Present Illness: .   Edwin Jimenez is a 73 y.o. male with a hx of nonobstructive CAD, hyperlipidemia, family history of CAD who is seen as a follow up. I met him 07/28/2018 for CV risk/prevention and murmur.  CV history: Echocardiogram 04/2018 normal LVEF 55-60%, grade 1 diastolic dysfunction, aortic sclerosis without stenosis. Coronary calcium  score 08/11/2018 with CAC 14.6 placing him on 29th percentile for age and sex matched control. cardiac CTA 05/02/23 with nonobstructive CAD calcium  score of 12 (prox RCA 25-49% stenosed, pLAD 1-24%, mLAD 25-49%). Monitor with predominantly NSR, brief episodes of SVT. Triggered episodes associated with NSR or SVT.    Today: Has been having more issues with palpitations while running recently. Does not ever have symptoms at rest. Running pace has decreased; not sure if it is due to palpitations, or he sweats profusely (loses 5-6 lbs after a run from sweating). Drinks a lot of fluid and electrolytes but still has trouble replacing fluid volume. Worst symptoms in the summer when it is hot re: sweating, but grabbing is year round.   Feels like a grab in his chest when running causing him to stop or slow down. Usually worst early in the run, then able to restart running, then can occur again if he is doing a hill or other high exertion. Grab is quick, local. Sometimes associated with dizziness.  Orthostatic hypotension has also gotten worse, but he manages this with slow position changes.   ROS: Denies shortness of breath at rest or with normal exertion. No PND, orthopnea, LE edema or unexpected weight gain. No syncope or palpitations. ROS otherwise negative except as noted.   Studies Reviewed: SABRA    EKG:  EKG  Interpretation Date/Time:  Friday May 28 2024 08:16:42 EDT Ventricular Rate:  48 PR Interval:  156 QRS Duration:  82 QT Interval:  468 QTC Calculation: 418 R Axis:   57  Text Interpretation: Sinus bradycardia Confirmed by Bruckner Shelda [phone]) on 05/28/2024 8:19:47 AM    Physical Exam:   VS:  BP 116/70 (BP Location: Left Arm, Patient Position: Sitting, Cuff Size: Normal)   Pulse 62   Resp 16   Ht 5' 7 (1.702 m)   Wt 132 lb (59.9 kg)   SpO2 99%   BMI 20.67 kg/m    Wt Readings from Last 3 Encounters:  05/28/24 132 lb (59.9 kg)  04/29/24 132 lb (59.9 kg)  12/30/23 132 lb 3.2 oz (60 kg)    GEN: Well nourished, well developed in no acute distress HEENT: Normal, moist mucous membranes NECK: No JVD CARDIAC: regular rhythm, normal S1 and S2, no rubs or gallops. No murmur. VASCULAR: Radial and DP pulses 2+ bilaterally. No carotid bruits RESPIRATORY:  Clear to auscultation without rales, wheezing or rhonchi  ABDOMEN: Soft, non-tender, non-distended MUSCULOSKELETAL:  Ambulates independently SKIN: Warm and dry, no edema NEUROLOGIC:  Alert and oriented x 3. No focal neuro deficits noted. PSYCHIATRIC:  Normal affect    ASSESSMENT AND PLAN: .    Nonobstructive CAD Hypercholesterolemia Family history of CAD -CCTA 04/2023 with Ca score 12, TPV 80, 24-29% stenosis pRCA, pLAD 1-24%, 25-49% stenosis mLAD. We reviewed his images together tody -LDL goal <70, last 60 -continue atorvastatin  40 mg daily -takes asp -reviewed red flag warning signs that  need immediate medical attention  Palpitations/SVT: only exertional. Reviewed monitor. As his episodes are brief, using a PRN for symptoms unlikely to be helpful. Blood pressure and heart rate do not allow for standing daily dose of anything. We did discuss pre-run very low dose metoprolol  to see if this improves symptoms. Did review possibility of dip in recovery period with HR/BP, monitor for this.   Soft murmur: previously noted,  echo without significant valve abnormality (murmur not consistent with aortic sclerosis). Most consistent with flow  Orthostatic hypotension: manages conservatively, no syncope  CV risk counseling and prevention -recommend heart healthy/Mediterranean diet, with whole grains, fruits, vegetable, fish, lean meats, nuts, and olive oil. Limit salt. -recommend moderate walking, 3-5 times/week for 30-50 minutes each session. Aim for at least 150 minutes/week. Goal should be pace of 3 miles/hours, or walking 1.5 miles in 30 minutes. He has excellent exercise habits. -recommend avoidance of tobacco products. Avoid excess alcohol.  Dispo: 6 mos or sooner as needed  Total time of encounter: I spent 47 minutes dedicated to the care of this patient on the date of this encounter to include pre-visit review of records, face-to-face time with the patient discussing conditions above, and clinical documentation with the electronic health record. We specifically spent time today discussing review of CT, recommendations for management, symptoms, options for management as above   Signed, Shelda Bruckner, MD   Shelda Bruckner, MD, PhD, [facility]  [facility]  [place]  [facility] & Vascular at [facility] at [facility] [address] [place], [place] [address] [phone]

## 2024-05-28 NOTE — Patient Instructions (Addendum)
 Medication Instructions:  START METOPROLOL  25 MG AS NEEDED PRIOR TO RUNNING   *If you need a refill on your cardiac medications before your next appointment, please call your pharmacy*  Lab Work: NONE  Testing/Procedures: NONE  Follow-Up: At [facility], you and your health needs are our priority.  As part of our continuing mission to provide you with exceptional heart care, we have created designated Provider Care Teams.  These Care Teams include your primary Cardiologist (physician) and Advanced Practice Providers (APPs -  Physician Assistants and Nurse Practitioners) who all work together to provide you with the care you need, when you need it.  We recommend signing up for the patient portal called MyChart.  Sign up information is provided on this After Visit Summary.  MyChart is used to connect with patients for Virtual Visits (Telemedicine).  Patients are able to view lab/test results, encounter notes, upcoming appointments, etc.  Non-urgent messages can be sent to your provider as well.   To learn more about what you can do with MyChart, go to [email or web address].    Your next appointment:   6 month(s)  The format for your next appointment:   In Person  Provider:   Shelda Bruckner, MD

## 2024-05-31 ENCOUNTER — Telehealth (HOSPITAL_BASED_OUTPATIENT_CLINIC_OR_DEPARTMENT_OTHER): Payer: Self-pay | Admitting: Cardiology

## 2024-05-31 NOTE — Telephone Encounter (Signed)
 Patient wants a call back regarding exercise routine and note from After Visit Summary.

## 2024-05-31 NOTE — Telephone Encounter (Addendum)
 Patient called in concerned about below in his office note from last week   CV risk counseling and prevention -recommend heart healthy/Mediterranean diet, with whole grains, fruits, vegetable, fish, lean meats, nuts, and olive oil. Limit salt. -recommend moderate walking, 3-5 times/week for 30-50 minutes each session. Aim for at least 150 minutes/week. Goal should be pace of 3 miles/hours, or walking 1.5 miles in 30 minutes. He has excellent exercise habits. -recommend avoidance of tobacco products. Avoid excess alcohol.  Discussed with Dr Lonni and she would like patient to continue his current regimen, he had stated he was going to start slowing down his running. No need to stop running   Advised patient, verbalized understanding

## 2024-07-08 ENCOUNTER — Encounter (INDEPENDENT_AMBULATORY_CARE_PROVIDER_SITE_OTHER): Payer: Self-pay | Admitting: Otolaryngology

## 2024-07-08 ENCOUNTER — Ambulatory Visit (INDEPENDENT_AMBULATORY_CARE_PROVIDER_SITE_OTHER): Payer: Medicare Other | Admitting: Otolaryngology

## 2024-07-08 VITALS — BP 118/70 | HR 51 | Temp 97.5°F

## 2024-07-08 DIAGNOSIS — R0981 Nasal congestion: Secondary | ICD-10-CM

## 2024-07-08 DIAGNOSIS — J3089 Other allergic rhinitis: Secondary | ICD-10-CM

## 2024-07-08 DIAGNOSIS — J383 Other diseases of vocal cords: Secondary | ICD-10-CM

## 2024-07-08 DIAGNOSIS — R490 Dysphonia: Secondary | ICD-10-CM

## 2024-07-08 DIAGNOSIS — H9390 Unspecified disorder of ear, unspecified ear: Secondary | ICD-10-CM

## 2024-07-08 DIAGNOSIS — R0982 Postnasal drip: Secondary | ICD-10-CM

## 2024-07-08 MED ORDER — AZELASTINE HCL 0.1 % NA SOLN: 2.0000 | Freq: Two times a day (BID) | NASAL | 12 refills | Status: AC

## 2024-07-08 MED ORDER — DESLORATADINE 5 MG PO TABS: 5.0000 mg | ORAL_TABLET | Freq: Every day | ORAL | 3 refills | Status: AC

## 2024-07-08 NOTE — Progress Notes (Signed)
 ENT Progress Note:   Update 07/08/2024  Discussed the use of AI scribe software for clinical note transcription with the patient, who gave verbal consent to proceed.  History of Present Illness Edwin Jimenez PAT is a 73 year old male who presents with throat discomfort and nasal congestion.  He experiences occasional hoarseness without significant throat discomfort. He has previously discussed voice-related issues and is currently not pursuing any interventions by choice.  He mentions nasal congestion but does not find it significant. He has been using Astelin  (azelastine ) once daily, recently increasing to twice daily, and takes Clarinex  (desloratadine ) daily. He is considering whether to continue these medications based on symptom recurrence.  He notes that his right ear occasionally feels blocked, suspecting earwax buildup. He recalls a previous visit where his ear was found to be plugged.  No significant nasal congestion at the time of the visit. Occasional hoarseness but no other significant throat discomfort. Sensation of ear blockage on the right side.  Records Reviewed:  Initial Evaluation  Update 07/09/23: He has been doing Reflux Gourmet. He is on Azelastine  and Clarinex .  Throat discomfort/sore throat improved but he continues to have intermittent hoarseness. Here for repeat evaluation. Please see summary of his initial presentation below which I reviewed.   Initial Consult   Reason for Consult: throat discomfort    HPI: Edwin Jimenez is an 73 y.o. male with hx of CAD, GERD, who is here for 1 month of persistent sore throat, with dysphonia, raspy voice. [place]x and it did help but only partially. He had EGD in 80's and had gastritis and esophagitis, had Prilosec then. He started Prilosec a few weeks ago, and it is better. He denies hx of similar in the past. He had dry cough but not currently. He does clear his throat. No  dyspnea, no trouble with swallowing. No hx of smoking. He denies nasal congestion or PND. He also feels that his ears are stuffed with ear wax and would like them checked/have cerumen removed.   Records Reviewed:  Seen by Cards 04/21/23 73 y.o. male with history of coronary artery disease, hyperlipidemia.  Family history of CAD.   Echocardiogram 04/2018 normal LVEF 55-60%, grade 1 diastolic dysfunction, aortic sclerosis without stenosis.  Coronary calcium  score 08/11/2018 with CAC 14.6 placing him on 29th percentile for age and sex matched control.   Last seen 2022 noting orthostatic lightheadedness, dizziness.  And was encouraged gradual position changes, adequate hydration.    Past Medical History:  Diagnosis Date   Dyslipidemia    Dysrhythmia    ED (erectile dysfunction)    Hyperlipidemia     Past Surgical History:  Procedure Laterality Date   COLONOSCOPY     x2   CYSTOSCOPY WITH RETROGRADE PYELOGRAM, URETEROSCOPY AND STENT PLACEMENT Right 05/21/2023   Procedure: CYSTOSCOPY WITH RIGHT  RETROGRADE PYELOGRAM, RIGHT URETEROSCOPY AND RIGHT URETERAL  STENT PLACEMENT;  Surgeon: Devere Lonni Righter, MD;  Location: [facility];  Service: Urology;  Laterality: Right;  45 MINUTES   EXCISION MASS ABDOMINAL Left    angiolipoma   HERNIA REPAIR  06/16/12   [facility]   INGUINAL HERNIA REPAIR  06/16/2012   Procedure: HERNIA REPAIR INGUINAL ADULT;  Surgeon: Donnice KATHEE Lunger, MD;  Location: [facility];  Service: General;  Laterality: Left;  left inguinal hernia repair with mesh    Family History  Problem Relation Age of Onset   Heart disease Father  67   Transient ischemic attack Father    Stroke Mother    Diabetes Sister    Hypertension Neg Hx     Social History:  reports that he has never smoked. He has never used smokeless tobacco. He reports current alcohol use. He reports that he does not use drugs.  Allergies: No Known Allergies  Medications: I have reviewed the patient's  current medications.  The PMH, PSH, Medications, Allergies, and SH were reviewed and updated.  ROS: Constitutional: Negative for fever, weight loss and weight gain. Cardiovascular: Negative for chest pain and dyspnea on exertion. Respiratory: Is not experiencing shortness of breath at rest. Gastrointestinal: Negative for nausea and vomiting. Neurological: Negative for headaches. Psychiatric: The patient is not nervous/anxious  Blood pressure 118/70, pulse (!) 51, temperature (!) 97.5 F (36.4 C), SpO2 98%.  PHYSICAL EXAM:  Exam: General: Well-developed, well-nourished Communication and Voice: intermittently raspy  Respiratory Respiratory effort: Equal inspiration and expiration without stridor Cardiovascular Peripheral Vascular: Warm extremities with equal color/perfusion Eyes: No nystagmus with equal extraocular motion bilaterally Neuro/Psych/Balance: Patient oriented to person, place, and time; Appropriate mood and affect; Gait is intact with no imbalance; Cranial nerves I-XII are intact Head and Face Inspection: Normocephalic and atraumatic without mass or lesion Palpation: Facial skeleton intact without bony stepoffs Salivary Glands: No mass or tenderness Facial Strength: Facial motility symmetric and full bilaterally ENT Pinna: External ear intact and fully developed External canal: Canal is patent with intact skin ear canal stenosis  Tympanic Membrane: Clear and mobile External Nose: No scar or anatomic deformity Lips, Teeth, and gums: Mucosa and teeth intact and viable Neck Neck and Trachea: Midline trachea without mass or lesion Thyroid: No mass or nodularity Lymphatics: No lymphadenopathy  Assessment/Plan: Encounter Diagnoses  Name Primary?   Dysphonia Yes   Post-nasal drainage    Hoarseness [R49.0]    Vocal fold atrophy    Environmental and seasonal allergies       71 yoM hx of CAD, GERD and URI 1 mo ago, treated with abx, here for persistent sore  throat/throat discomfort, dysphonia and frequent throat clearing. Initiated Prilosec and feels somewhat improved but sx are still present. Also had cerumen impaction with muffled hearing, which improved after cerumen removal today. Denies trouble with swallowing, pain with swallowing or breathing.  On exam, including flexible laryngoscopy there was evidence of nasal congestion PND and moderate post-cricoid edema pachydermia c/w GERD LPR. He had vocal fold atrophy without lesions that appears to be age-appropriate. We discussed exam findings and vocal hygiene. I offered referral to see SLP, but he would like to hold off at this time. We will initiate management of GERD and PND, and will have him return for a videostrobe in 3 months.   - start Famotidine  + Reflux Gourmet  - start Azelastine  and Clarinex  - diet and lifestyle changes to minimize reflux  - return in 3 months  - Sweet oil for ear wax and dry ears - SLP therapy if he decides in the future  Update 07/09/23 He has been doing Reflux Gourmet. He is on Azelastine  and Clarinex .  Throat discomfort improved but he continues to have intermittent hoarseness. Does not wish to pursue voice therapy.  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 Nasal congestion/post-nasal drip/environmental allergies - continue Azelastine  and Clarinex  4.  Cerumen impaction resolved following procedure last office visit - Sweet oil for ear wax and dry ears - f/u 1 year  Update 07/08/2024  Assessment & Plan Chronic nasal congestion Environmental Allergies Intermittent nasal congestion with allergy-related symptoms, history  of recurrence. Continues desloratadine  daily and azelastine  as needed, recently increased to twice daily. - Refill Astelin  (azelastine ) and Clarinex  (desloratadine ). - Advised continuation of medications if symptoms persist or recur.   Narrow ear canals hx of cerumen impaction Narrow ear canals with occasional sensation of ear blockage, minimal dry skin, no significant wax buildup. - continue Sweet Oil       Elena Larry, MD Otolaryngology [facility] Phone: [phone] Fax: [phone]    07/08/2024, 1:15 PM

## 2024-08-09 ENCOUNTER — Encounter: Payer: Self-pay | Admitting: Radiology

## 2024-08-16 ENCOUNTER — Ambulatory Visit: Admitting: Sports Medicine

## 2024-08-16 ENCOUNTER — Other Ambulatory Visit (INDEPENDENT_AMBULATORY_CARE_PROVIDER_SITE_OTHER)

## 2024-08-16 ENCOUNTER — Encounter: Payer: Self-pay | Admitting: Sports Medicine

## 2024-08-16 DIAGNOSIS — G8929 Other chronic pain: Secondary | ICD-10-CM

## 2024-08-16 DIAGNOSIS — M25551 Pain in right hip: Secondary | ICD-10-CM

## 2024-08-16 DIAGNOSIS — M19041 Primary osteoarthritis, right hand: Secondary | ICD-10-CM | POA: Diagnosis not present

## 2024-08-16 DIAGNOSIS — M76891 Other specified enthesopathies of right lower limb, excluding foot: Secondary | ICD-10-CM | POA: Diagnosis not present

## 2024-08-16 DIAGNOSIS — M79644 Pain in right finger(s): Secondary | ICD-10-CM

## 2024-08-16 MED ORDER — CELECOXIB 100 MG PO CAPS: 100.0000 mg | ORAL_CAPSULE | Freq: Two times a day (BID) | ORAL | 1 refills | Status: AC

## 2024-08-16 NOTE — Progress Notes (Signed)
 Patient says that his left thumb is still bothersome, although his right thumb has become painful as well in the last few weeks. He wears his brace on the left thumb, which helps while he is wearing it but says that it makes his pain worse when he takes it off. He is not doing anything to treat his right thumb pain. He denies any popping or clicking in the right thumb, and denies any noticeable swelling or bruising.  Patient says that he has had right hip pain for a couple of years, but in the last month his pain has become constant. He is a runner, and has had to decrease both his pace and his mileage due to his pain. He denies any popping or clicking in the hip, and denies any radicular symptoms down the leg. He points to the anterior hip, groin, and adductors when describing his pain. He denies any lateral or posterior hip pain.

## 2024-08-16 NOTE — Progress Notes (Signed)
 Edwin Jimenez - 73 y.o. male MRN [id number]  Date of birth: 06-13-1951  Office Visit Note: Visit Date: 08/16/2024 PCP: Joyce Norleen BROCKS, MD Referred by: Joyce Norleen BROCKS, MD  Subjective: Chief Complaint  Patient presents with   Right Hip - Pain   Right Hand - Pain   HPI: Edwin Jimenez is a pleasant 73 y.o. male who presents today for new-onset of right hip pain and right thumb pain.  Right anterior hip -he has had this pain on and off for few years.  Over the last month the pain has become more constant and intense.  He is an avid runner, running 5 to 6 days weekly usually anywhere between 7 and 10 miles.  His pain is more so in the anterior hip, occasionally will go into the groin or the adductors.  Denies any clicking or popping.  He had seen Edwin fields in the past and does have a history of right hip/pelvis avulsion fracture years ago.  Note reviewed from Edwin Haddock, MD on 04/30/2024 - He has a history of a remote avulsion fracture of his pelvis that I saw after a marathon run.  Was seen by Edwin Jimenez as well in the past with notable mild hip DJD, thought likely secondary to a flare of this versus hip flexor in past.  Right thumb - over the last month or so his right thumb has become painful.  He points closer to the MCP as opposed to the [facility] joint.  No injury, it is painful when this is bumped.  He has advanced CMC joint arthritis of the left thumb that has been improved somewhat with injection therapy and bracing.   Note reviewed from Edwin Haddock, MD on 04/30/2024 - He has a history of a remote avulsion fracture of his pelvis that I saw after a marathon run.  Was seen by Edwin Jimenez as well in the past with notable mild hip DJD, thought likely secondary to a flare of this versus hip flexor in past.  Pertinent ROS were reviewed with the patient and found to be negative unless otherwise specified above in HPI.   Assessment & Plan: Visit Diagnoses:  1. 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 Pain of right thumb   3. Chronic right hip pain   4. Hip flexor tendinitis, right    Plan: Impression is acute on chronic right anterior hip pain which seems to be emanating more so from the anterior hip flexors with a degree of ability which is likely contributing.  He has very mild osteoarthritic change about the hips and FADIR and other intra-articular pathology does not seem to be bothering him from a provocative standpoint.  He has a remote history of an avulsion fracture after running a [employer].  At this point, we discussed treatment and would like to move forward with formalized physical therapy with transition to HEP.  I would like to bring him back to ultrasound the hip as well to further evaluate and help guide PT and treatment.  In terms of his right thumb pain, he has more arthritic change and pain emanating from the MCP joint although certainly not advanced.  For both this and his hip flexor pain, I would like to start him on a short course of Celebrex 100 mg twice daily with food for the next 10 days and then may taper off.  We will hold from thumb bracing but could consider if not improved.  I will see him back in the next 1-2 weeks to ultrasound the right hip and evaluate how he is responding to inflammatories.  Meds & Orders:  Meds ordered this encounter  Medications   celecoxib (CELEBREX) 100 MG capsule    Sig: Take 1 capsule (100 mg total) by mouth 2 (two) times daily.    Dispense:  30 capsule    Refill:  1    Orders Placed This Encounter  Procedures   XR Finger Thumb Right   XR HIP UNILAT W OR W/O PELVIS 2-3 VIEWS RIGHT   Ambulatory referral to Physical Therapy     Procedures: No procedures performed      Clinical History: No specialty comments available.  He reports that he has never smoked. He has never used smokeless tobacco. No results for input(s): HGBA1C, LABURIC in the last 8760 hours.  Objective:     Physical Exam  Gen: Well-appearing, in no acute distress; non-toxic CV: Well-perfused. Warm.  Resp: Breathing unlabored on room air; no wheezing. Psych: Fluid speech in conversation; appropriate affect; normal thought process  Ortho Exam - Right hip: There is no bony tenderness at the ASIS or greater trochanteric region.  There is about 5 degrees less of internal rotation compared to the contralateral hip with logroll testing.  Negative FADIR test, negative FABER test although some pelvic restriction is noted with inflexibility.  There is mild discomfort with resisted hip flexion, more so at the externally rotated position with activation of the rectus femoris and sartorius.  - Right thumb: There is tenderness with manipulation at the MCP joint.  There is pain with extension and about 7 to 8 degrees less of flexion compared to the contralateral thumb.  There is no UCL insufficiency.  Negative CMC grind test.  Negative Finkelstein's test.  Imaging: XR Finger Thumb Right Result Date: 08/16/2024 Three-view x-ray of the right thumb including AP, oblique and lateral film were ordered and reviewed by myself today.  X-rays demonstrate mild CMC joint narrowing, mild to moderate MCP joint narrowing.  Sesamoid bone noted.  No acute fracture.  XR HIP UNILAT W OR W/O PELVIS 2-3 VIEWS RIGHT Result Date: 08/16/2024 2 view x-ray of the right hip including AP pelvis and right hip lateral film were ordered and reviewed by myself today.  X-rays demonstrate mild joint space narrowing with DJD.  There is no advanced arthropathy noted.  No acute fracture noted.   Past Medical/Family/Surgical/Social History: Medications & Allergies reviewed per EMR, new medications updated. Patient Active Problem List   Diagnosis Date Noted   Abdominal muscle strain, initial encounter 04/29/2024   Benign prostatic hyperplasia with nocturia 11/01/2021   Seasonal allergic rhinitis due to pollen 01/10/2020   Gastroesophageal  reflux disease without esophagitis 01/10/2020   Flow murmur 07/28/2018   Family history of diabetes mellitus 09/22/2017   Age-related cataract of both eyes 09/22/2017   Eczema 09/22/2017   Rosacea 05/19/2014   Multiple lipomas 05/19/2014   Family history of ischemic heart disease (IHD) 09/04/2011   Hyperlipidemia with target LDL less than 70 09/04/2011   ED (erectile dysfunction) 09/04/2011   HALLUX RIGIDUS 07/11/2008   Past Medical History:  Diagnosis Date   Dyslipidemia    Dysrhythmia    ED (erectile dysfunction)    Hyperlipidemia    Family History  Problem Relation Age of Onset   Heart disease Father 31   Transient ischemic attack Father    Stroke Mother    Diabetes Sister    Hypertension Neg  Hx    Past Surgical History:  Procedure Laterality Date   COLONOSCOPY     x2   CYSTOSCOPY WITH RETROGRADE PYELOGRAM, URETEROSCOPY AND STENT PLACEMENT Right 05/21/2023   Procedure: CYSTOSCOPY WITH RIGHT  RETROGRADE PYELOGRAM, RIGHT URETEROSCOPY AND RIGHT URETERAL  STENT PLACEMENT;  Surgeon: Devere Lonni Righter, MD;  Location: [facility];  Service: Urology;  Laterality: Right;  45 MINUTES   EXCISION MASS ABDOMINAL Left    angiolipoma   HERNIA REPAIR  06/16/12   [facility]   INGUINAL HERNIA REPAIR  06/16/2012   Procedure: HERNIA REPAIR INGUINAL ADULT;  Surgeon: Donnice KATHEE Lunger, MD;  Location: [facility];  Service: General;  Laterality: Left;  left inguinal hernia repair with mesh   Social History   Occupational History   Not on file  Tobacco Use   Smoking status: Never   Smokeless tobacco: Never  Vaping Use   Vaping status: Never Used  Substance and Sexual Activity   Alcohol use: Yes    Comment: wine of weekends   Drug use: No   Sexual activity: Yes

## 2024-08-25 ENCOUNTER — Encounter: Payer: Self-pay | Admitting: Sports Medicine

## 2024-08-25 ENCOUNTER — Other Ambulatory Visit: Payer: Self-pay

## 2024-08-25 ENCOUNTER — Ambulatory Visit: Admitting: Sports Medicine

## 2024-08-25 DIAGNOSIS — M180 Bilateral primary osteoarthritis of first carpometacarpal joints: Secondary | ICD-10-CM

## 2024-08-25 DIAGNOSIS — M76891 Other specified enthesopathies of right lower limb, excluding foot: Secondary | ICD-10-CM | POA: Diagnosis not present

## 2024-08-25 DIAGNOSIS — G8929 Other chronic pain: Secondary | ICD-10-CM | POA: Diagnosis not present

## 2024-08-25 DIAGNOSIS — M25551 Pain in right hip: Secondary | ICD-10-CM | POA: Diagnosis not present

## 2024-08-25 NOTE — Progress Notes (Signed)
 Edwin Jimenez - 73 y.o. male MRN [id number]  Date of birth: 1951-01-05  Office Visit Note: Visit Date: 08/25/2024 PCP: Edwin Norleen BROCKS, MD Referred by: Edwin Norleen BROCKS, MD  Subjective: Chief Complaint  Patient presents with   Right Hip - Follow-up   Right Hand - Follow-up   HPI: Edwin Jimenez is a pleasant 73 y.o. male who presents today for evaluation of right anterior hip pain as well as bilateral thumb CMC OA.  Right hip - overall the right hip is doing better since her last visit.  He has been having anterior hip pain. He is an avid runner, running 5 to 6 days weekly usually anywhere between 7 and 10 miles.  He would like to move forward with ultrasound evaluation to help guide treatment and therapy.  Bilateral thumbs -has known bilateral CMC joint arthritic change.  We have provided injections into the [facility] joint on occasion which certainly helped although are painful when performing.  He does notice the pain chronically but these have been ok recently.  He is currently taking Celebrex twice daily with food, does feel this has helped the hip but has only noticed mild difference with the thumbs.  Does have CMC bracing.  Pertinent ROS were reviewed with the patient and found to be negative unless otherwise specified above in HPI.   Assessment & Plan: Visit Diagnoses:  1. Chronic right hip pain   2. Hip flexor tendinitis, right   3. Arthritis of carpometacarpal (CMC) joint of both thumbs    Plan: Impression is acute on chronic right anterior hip pain emanating from his hip flexors.  Examination and ultrasound demonstrate chronic appearing ASIS avulsion fracture with the insertional sartorius.  There is no acute hyperemia or acute bony findings present.  He has been treated for this in the past after having this avulsion fracture after the Edwin Jimenez years ago.  I do think he was dealing more with tendinitis/aggravation of this old injury and since has been improved  since being on the Celebrex medication.  We discussed performing some rehab exercises for this and overall strength and maintenance of the hip flexor/pelvic musculature.  He is agreeable to home therapy.  We did print out a customized handout and I did review these exercises with him in the room today, he will perform once daily.  Also discussed the benefits of getting into exercises such as yoga, Pilates a few times a week.  We did discuss creatine supplementation as well as strength-based training as well to help with muscle mass and overall bone health.  In terms of his thumbs, he does have advanced CMC joint arthritic change, at this point we will continue infrequent and as needed injections.  He will continue his Celebrex 100 mg for the total of 10 days and then he will discontinue at that time.  May use [facility] joint bracing as desires.  Follow-up as needed.  Follow-up: Return if symptoms worsen or fail to improve.   Meds & Orders: No orders of the defined types were placed in this encounter.   Orders Placed This Encounter  Procedures   US  Extrem Low Right Ltd     Procedures: No procedures performed      Clinical History: No specialty comments available.  He reports that he has never smoked. He has never used smokeless tobacco. No results for input(s): HGBA1C, LABURIC in the last 8760 hours.  Objective:    Physical Exam  Gen: Well-appearing, in no acute  distress; non-toxic CV: Well-perfused. Warm.  Resp: Breathing unlabored on room air; no wheezing. Psych: Fluid speech in conversation; appropriate affect; normal thought process  Ortho Exam - Right hip: No redness swelling or effusion of the hip, no specific tenderness over the ASIS or the rectus femoris insertion..  Previously had a mild discomfort with resisted hip flexion in an externally rotated position.  There is preserved range of motion with internal and external logroll without restriction.  Imaging: US  Extrem Low Right  Ltd Result Date: 08/25/2024 Limited musculoskeletal ultrasound of the right hip was performed today.  Evaluation of the ASIS demonstrates cortical regularity indicative of chronic sartorius avulsion fracture.  There are no acute findings with this, no hyperemia.  There is still an intact sartorius muscle with insertional scar tissue from previous avulsion. AIIS is visualized with no cortical regularity, proper insertion of the rectus femoris tendon with no abnormality heel or the muscle belly.  There is a very minimal degree of calcification within the labrum that is incompletely visualized.  Right hip with good femoral curve and cartilage preservation.  No hip effusion.   Chronic appearing sartorius avulsion fracture at the ASIS    Past Medical/Family/Surgical/Social History: Medications & Allergies reviewed per EMR, new medications updated. Patient Active Problem List   Diagnosis Date Noted   Abdominal muscle strain, initial encounter 04/29/2024   Benign prostatic hyperplasia with nocturia 11/01/2021   Seasonal allergic rhinitis due to pollen 01/10/2020   Gastroesophageal reflux disease without esophagitis 01/10/2020   Flow murmur 07/28/2018   Family history of diabetes mellitus 09/22/2017   Age-related cataract of both eyes 09/22/2017   Eczema 09/22/2017   Rosacea 05/19/2014   Multiple lipomas 05/19/2014   Family history of ischemic heart disease (IHD) 09/04/2011   Hyperlipidemia with target LDL less than 70 09/04/2011   ED (erectile dysfunction) 09/04/2011   HALLUX RIGIDUS 07/11/2008   Past Medical History:  Diagnosis Date   Dyslipidemia    Dysrhythmia    ED (erectile dysfunction)    Hyperlipidemia    Family History  Problem Relation Age of Onset   Heart disease Father 62   Transient ischemic attack Father    Stroke Mother    Diabetes Sister    Hypertension Neg Hx    Past Surgical History:  Procedure Laterality Date   COLONOSCOPY     x2   CYSTOSCOPY WITH RETROGRADE  PYELOGRAM, URETEROSCOPY AND STENT PLACEMENT Right 05/21/2023   Procedure: CYSTOSCOPY WITH RIGHT  RETROGRADE PYELOGRAM, RIGHT URETEROSCOPY AND RIGHT URETERAL  STENT PLACEMENT;  Surgeon: Devere Lonni Righter, MD;  Location: [facility];  Service: Urology;  Laterality: Right;  45 MINUTES   EXCISION MASS ABDOMINAL Left    angiolipoma   HERNIA REPAIR  06/16/12   [facility]   INGUINAL HERNIA REPAIR  06/16/2012   Procedure: HERNIA REPAIR INGUINAL ADULT;  Surgeon: Donnice KATHEE Lunger, MD;  Location: [facility];  Service: General;  Laterality: Left;  left inguinal hernia repair with mesh   Social History   Occupational History   Not on file  Tobacco Use   Smoking status: Never   Smokeless tobacco: Never  Vaping Use   Vaping status: Never Used  Substance and Sexual Activity   Alcohol use: Yes    Comment: wine of weekends   Drug use: No   Sexual activity: Yes   Patient was instructed in 10 minutes of therapeutic exercises for right hip flexors/quadriceps to improve strength, ROM and function according to my instructions and plan  of care during the office visit. A customized handout was provided and demonstration of proper technique shown and discussed. Patient did perform exercises and demonstrate understanding through teachback.  All questions discussed and answered.  Lonell Sprang, DO Primary Care Sports Medicine Physician  [facility]  This note was dictated using Dragon naturally speaking software and may contain errors in syntax, spelling, or content which have not been identified prior to signing this note.

## 2024-08-25 NOTE — Progress Notes (Signed)
 Patient says that his thumb is a bit better, but is still painful for him. He says that he is having no trouble with the hip now. He does take his Celebrex daily. Patient is here for ultrasound evaluation of the hip. He is asking if he still needs physical therapy, as he is feeling much better.

## 2024-09-09 ENCOUNTER — Ambulatory Visit

## 2024-10-19 ENCOUNTER — Ambulatory Visit: Admitting: Family Medicine

## 2024-10-19 ENCOUNTER — Ambulatory Visit: Payer: Self-pay

## 2024-10-19 VITALS — BP 114/74 | HR 63 | Temp 96.8°F | Wt 130.8 lb

## 2024-10-19 DIAGNOSIS — J0101 Acute recurrent maxillary sinusitis: Secondary | ICD-10-CM | POA: Diagnosis not present

## 2024-10-19 DIAGNOSIS — R051 Acute cough: Secondary | ICD-10-CM

## 2024-10-19 LAB — POC COVID19/FLU A&B COMBO
Covid Antigen, POC: NEGATIVE
Influenza A Antigen, POC: NEGATIVE
Influenza B Antigen, POC: NEGATIVE

## 2024-10-19 MED ORDER — AMOXICILLIN-POT CLAVULANATE 875-125 MG PO TABS: 1.0000 | ORAL_TABLET | Freq: Two times a day (BID) | ORAL | 0 refills | Status: AC

## 2024-10-19 MED ORDER — AMOXICILLIN-POT CLAVULANATE 875-125 MG PO TABS: 1.0000 | ORAL_TABLET | Freq: Two times a day (BID) | ORAL | 0 refills | Status: DC

## 2024-10-19 NOTE — Telephone Encounter (Signed)
 FYI Only or Action Required?: FYI only for provider: appointment scheduled on this afternoon.  Patient was last seen in primary care on 12/30/2023 by Joyce Norleen BROCKS, MD.  Called Nurse Triage reporting Cough.  Symptoms began a week ago.  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  Symptoms are: unchanged.  Triage Disposition: Home Care - pt wants to be seen  Patient/caregiver understands and will follow disposition?: No, wishes to speak with PCP                 Copied from CRM #[id number]. Topic: Clinical - Red Word Triage >> Oct 19, 2024  8:29 AM Edwin Jimenez: Red Word that prompted transfer to Nurse Triage: Pt called in stating that he has been having sinus congestion, runny nose, worsening cough and chest tightness. Pt denied any other symptoms. Warm transferred to nurse triage. Reason for Disposition  Cough with cold symptoms (e.g., runny nose, postnasal drip, throat clearing)  Answer Assessment - Initial Assessment Questions 1. ONSET: When did the cough begin?      1 week 2. SEVERITY: How bad is the cough today?      moderate 3. SPUTUM: Describe the color of your sputum (e.g., none, dry cough; clear, white, yellow, green)     Green and white 4. HEMOPTYSIS: Are you coughing up any blood? If Yes, ask: How much? (e.g., flecks, streaks, tablespoons, etc.)     no 5. DIFFICULTY BREATHING: Are you having difficulty breathing? If Yes, ask: How bad is it? (e.g., mild, moderate, severe)      no 6. FEVER: Do you have a fever? If Yes, ask: What is your temperature, how was it measured, and when did it start?     no 7. CARDIAC HISTORY: Do you have any history of heart disease? (e.g., heart attack, congestive heart failure)      Tachycardia 8. LUNG HISTORY: Do you have any history of lung disease?  (e.g., pulmonary embolus, asthma, emphysema)     no  10. OTHER SYMPTOMS: Do you have any other symptoms? (e.g., runny nose, wheezing,  chest pain)       Sinus congestion, runnny nose, lost voice a few days ago  Protocols used: Cough - Acute Productive-A-AH

## 2024-10-19 NOTE — Progress Notes (Signed)
" ° °  Subjective:    Patient ID: Edwin Jimenez, male    DOB: 05/12/1951, 74 y.o.   MRN: [id number]  Discussed the use of AI scribe software for clinical note transcription with the patient, who gave verbal consent to proceed.  History of Present Illness   Edwin Jimenez PAT is a 74 year old male who presents with symptoms of a sinus infection.  He experiences symptoms consistent with his usual annual sinus infection, beginning with a scratchy throat and progressing to nasal congestion, sore throat, and postnasal drainage. The cough is mostly dry, with occasional sputum production.  Significant nasal congestion has been severe enough to require Afrin nasal spray at night to aid breathing. He avoids using Sudafed at night due to its stimulating effects that interfere with sleep.  He reports sinus pressure and pain, particularly in the head and neck area, and mentions a headache that occurred around 2 AM, which was severe enough to disrupt his breathing.  He has a history of using Augmentin  for similar infections in the past and finds it effective. He also has tessalon  perles and dexamethasone  at home for managing symptoms.  No upper teeth soreness. He reports sinus pressure and pain, particularly in the head and neck area.           Review of Systems     Objective:    Physical Exam Alert and in no distress. Tympanic membranes and canals are normal. Pharyngeal area is normal.  Tender to palpation over maxillary sinuses neck is supple without adenopathy or thyromegaly. Cardiac exam shows a regular sinus rhythm without murmurs or gallops. Lungs are clear to auscultation.             Assessment & Plan:  Acute sinusitis Symptoms consistent with annual sinus infection. Severe nasal congestion noted. - Prescribed Augmentin . - Advised Afrin at night. -    "

## 2024-10-19 NOTE — Telephone Encounter (Signed)
 Has an appt today

## 2024-11-12 ENCOUNTER — Other Ambulatory Visit: Payer: Self-pay

## 2024-11-12 ENCOUNTER — Encounter: Admitting: Sports Medicine

## 2024-11-12 ENCOUNTER — Ambulatory Visit: Admitting: Sports Medicine

## 2024-11-12 ENCOUNTER — Encounter: Payer: Self-pay | Admitting: Sports Medicine

## 2024-11-12 DIAGNOSIS — M79605 Pain in left leg: Secondary | ICD-10-CM

## 2024-11-12 DIAGNOSIS — S86112A Strain of other muscle(s) and tendon(s) of posterior muscle group at lower leg level, left leg, initial encounter: Secondary | ICD-10-CM

## 2024-11-12 NOTE — Progress Notes (Signed)
 Patient says that he has had calf pain in the past, with his current pain being in the last month or so. He says that it feels consistent with pain that he has had evaluated in the past. He denies having any known injury, but began having stabbing pains in the middle of the left calf while running after only a couple of miles. He denies having any pop or pull. He has not had any noticeable swelling or bruising. He has tried taking time off, but when he tries to run again his pain returns within the first couple of miles. He has tried some stretches and exercises for the calf, but otherwise has not done anything to treat his pain at home.

## 2024-11-12 NOTE — Progress Notes (Signed)
 "  Edwin Jimenez - 74 y.o. male MRN [id number]  Date of birth: [birth year]  Office Visit Note: Visit Date: 11/12/2024 PCP: Joyce Norleen BROCKS, MD Referred by: Joyce Norleen BROCKS, MD  Subjective: Chief Complaint  Patient presents with   Left Leg - Pain   HPI: Edwin Jimenez is a pleasant 74 y.o. male who presents today for acute on chronic left calf injury/pain.  Discussed the use of AI scribe software for clinical note transcription with the patient, who gave verbal consent to proceed.  History of Present Illness Edwin Jimenez PAT is a 74 year old male with recurrent left calf muscle injuries who presents with left calf pain and dysfunction following a recent exacerbation.  He has had intermittent left calf pain for several years with recurrent flares. The current episode began acutely [place] ago [place] the beach with stabbing pain in the mid-calf. Pain is more likely when the muscle is cold than with higher activity levels.  He stopped running after onset, but pain persisted. After one week of rest he could walk normally, but jogging reliably triggers recurrence. Two recent short runs of about two miles caused discomfort and flare requiring him to stop. Pain is less intense than the prior day but remains bothersome after exertion.  Pain is localized to the mid-left calf and is somewhat diffuse. He does not recall notable swelling, bruising, or radiation toward the Achilles. He has had similar injuries in both calves, more on the left. Prior imaging reportedly showed muscle tears and strains.  He uses a spin bike and foam roller for cross-training without major symptom worsening, though foam rolling is uncomfortable so he avoids it. He wears compression sleeves during the day and has used heel lifts in the past. He has not tried shockwave therapy or dry needling.   Pertinent ROS were reviewed with the patient and found to be negative unless otherwise specified above in  HPI.   Assessment & Plan: Visit Diagnoses:  1. Gastrocnemius muscle tear, left, initial encounter   2. Pain in left leg    Assessment & Plan Partial tear of left medial gastrocnemius muscle Recurrent, grade 2B partial tear at the aponeurosis/fascial layer of the left medial gastrocnemius-soleus juncture with interstitial fiber disruption and mild intramuscular hemorrhage, without tendon retraction. Expected to recover with conservative management. Shockwave therapy recommended to accelerate healing, will hold for additional week given re-aggravation just yesterday. Anticipated full recovery and return to running over the course of the next 4-6 weeks. - Rest from running x 2-3 weeks, avoid activities exacerbating calf strain. - Cross-train with low-resistance cycling and pool exercises, static roller/rowing. - Use calf compression sleeve during ambulation. - Provide heel lifts, 7/16 to reduce tension on gastrocnemius and Achilles. - Avoid foam rolling and aggressive massage for two weeks; gentle massage permitted. - Initiate calf rehabilitation exercises after one-two weeks, including TheraBand resistance and progressive single-leg calf raises, starting on flat surface and advancing to stairs. - hold on Celebrex  100mg  use during acute phase, ok for tylenol  as needed - Provide written calf rehabilitation exercise instructions. - Schedule two shockwave therapy sessions, one week apart. - Weekly reassessment; follow-up scheduled for shockwave therapy.  Bilateral thumb CMC osteoarthritis Chronic, symptomatic bilateral thumb CMC osteoarthritis, unchanged from prior visits.   Follow-up: Return in about 2 weeks (around 11/26/2024) for Schedule in 2 weeks for L-calf.   Meds & Orders: No orders of the defined types were placed in this encounter.   Orders Placed  This Encounter  Procedures   US  Extrem Low Left Ltd     Procedures: No procedures performed      Clinical History: No specialty  comments available.  He reports that he has never smoked. He has never used smokeless tobacco. No results for input(s): HGBA1C, LABURIC in the last 8760 hours.  Objective:    Physical Exam  Gen: Well-appearing, in no acute distress; non-toxic CV: Well-perfused. Warm.  Resp: Breathing unlabored on room air; no wheezing. Psych: Fluid speech in conversation; appropriate affect; normal thought process  *[facility]/Ortho Exam: Physical Exam MUSCULOSKELETAL: Left calf with mild bleeding, partial tear in the medial gastrocnemius, blood in the fascial plane, and soft tissue swelling. Calf otherwise appears normal.  Imaging: US  Extrem Low Left Ltd Result Date: 11/12/2024 Limited musculoskeletal ultrasound of the left calf was performed today.  Ultrasound of the Achilles shows no abnormality.  In the mid belly of the calf musculature between the medial head of the gastrocnemius and soleus aponeurosis there is aponeurotic disruption with hypoechoic fluid between the sheath.  There is edema and hazy isoechoic change within this MHG- gastrosoleus juncture indicative of a grade IIa-IIb partial tear.  There is no full-thickness tearing.  There is no abnormality of the lateral head of the Gastrocnemius.      Past Medical/Family/Surgical/Social History: Medications & Allergies reviewed per EMR, new medications updated. Patient Active Problem List   Diagnosis Date Noted   Abdominal muscle strain, initial encounter 04/29/2024   Benign prostatic hyperplasia with nocturia 11/01/2021   Seasonal allergic rhinitis due to pollen 01/10/2020   Gastroesophageal reflux disease without esophagitis 01/10/2020   Flow murmur 07/28/2018   Family history of diabetes mellitus 09/22/2017   Age-related cataract of both eyes 09/22/2017   Eczema 09/22/2017   Rosacea 05/19/2014   Multiple lipomas 05/19/2014   Family history of ischemic heart disease (IHD) 09/04/2011   Hyperlipidemia with target LDL less than 70 09/04/2011    ED (erectile dysfunction) 09/04/2011   HALLUX RIGIDUS 07/11/2008   Past Medical History:  Diagnosis Date   Dyslipidemia    Dysrhythmia    ED (erectile dysfunction)    Hyperlipidemia    Family History  Problem Relation Age of Onset   Heart disease Father 3   Transient ischemic attack Father    Stroke Mother    Diabetes Sister    Hypertension Neg Hx    Past Surgical History:  Procedure Laterality Date   COLONOSCOPY     x2   CYSTOSCOPY WITH RETROGRADE PYELOGRAM, URETEROSCOPY AND STENT PLACEMENT Right 05/21/2023   Procedure: CYSTOSCOPY WITH RIGHT  RETROGRADE PYELOGRAM, RIGHT URETEROSCOPY AND RIGHT URETERAL  STENT PLACEMENT;  Surgeon: Devere Lonni Righter, MD;  Location: [facility];  Service: Urology;  Laterality: Right;  45 MINUTES   EXCISION MASS ABDOMINAL Left    angiolipoma   HERNIA REPAIR  06/16/12   [facility]   INGUINAL HERNIA REPAIR  06/16/2012   Procedure: HERNIA REPAIR INGUINAL ADULT;  Surgeon: Donnice KATHEE Lunger, MD;  Location:  [facility];  Service: General;  Laterality: Left;  left inguinal hernia repair with mesh   Social History   Occupational History   Not on file  Tobacco Use   Smoking status: Never   Smokeless tobacco: Never  Vaping Use   Vaping status: Never Used  Substance and Sexual Activity   Alcohol use: Yes    Comment: wine of weekends   Drug use: No   Sexual activity: Yes   I spent 32 minutes in  the care of the patient today including face-to-face time, preparation to see the patient, as well as review and interpretation of US  imaging and discussion regarding degree/nature of muscle tear/injury, physical activity and running modification/guidance, heel lift application for the above diagnoses.   Lonell Sprang, DO Primary Care Sports Medicine Physician  [facility]  This note was dictated using Dragon naturally speaking software and may contain errors in syntax, spelling, or content which have not been identified  prior to signing this note.    "

## 2024-11-18 ENCOUNTER — Ambulatory Visit: Admitting: Sports Medicine

## 2024-11-24 ENCOUNTER — Ambulatory Visit: Admitting: Sports Medicine

## 2024-12-30 ENCOUNTER — Encounter: Payer: Self-pay | Admitting: Family Medicine
